# Patient Record
Sex: Female | Born: 1980 | Race: White | Hispanic: No | Marital: Married | Smoking: Never smoker
Health system: Southern US, Community
[De-identification: ages and names within clinical notes are randomized; demographics above are authoritative.]

## PROBLEM LIST (undated history)

## (undated) DIAGNOSIS — F17200 Nicotine dependence, unspecified, uncomplicated: Secondary | ICD-10-CM

## (undated) DIAGNOSIS — F341 Dysthymic disorder: Secondary | ICD-10-CM

## (undated) DIAGNOSIS — N912 Amenorrhea, unspecified: Secondary | ICD-10-CM

## (undated) DIAGNOSIS — F32A Depression, unspecified: Secondary | ICD-10-CM

## (undated) DIAGNOSIS — R197 Diarrhea, unspecified: Secondary | ICD-10-CM

## (undated) DIAGNOSIS — Z113 Encounter for screening for infections with a predominantly sexual mode of transmission: Secondary | ICD-10-CM

## (undated) DIAGNOSIS — L309 Dermatitis, unspecified: Secondary | ICD-10-CM

## (undated) DIAGNOSIS — Z302 Encounter for sterilization: Secondary | ICD-10-CM

## (undated) DIAGNOSIS — K219 Gastro-esophageal reflux disease without esophagitis: Secondary | ICD-10-CM

## (undated) DIAGNOSIS — L600 Ingrowing nail: Secondary | ICD-10-CM

## (undated) DIAGNOSIS — IMO0001 Reserved for inherently not codable concepts without codable children: Secondary | ICD-10-CM

## (undated) DIAGNOSIS — R002 Palpitations: Secondary | ICD-10-CM

## (undated) DIAGNOSIS — B182 Chronic viral hepatitis C: Principal | ICD-10-CM

## (undated) DIAGNOSIS — J019 Acute sinusitis, unspecified: Secondary | ICD-10-CM

## (undated) DIAGNOSIS — Z13220 Encounter for screening for lipoid disorders: Secondary | ICD-10-CM

## (undated) DIAGNOSIS — K529 Noninfective gastroenteritis and colitis, unspecified: Secondary | ICD-10-CM

## (undated) DIAGNOSIS — IMO0002 Reserved for concepts with insufficient information to code with codable children: Secondary | ICD-10-CM

## (undated) DIAGNOSIS — C50511 Malignant neoplasm of lower-outer quadrant of right female breast: Secondary | ICD-10-CM

## (undated) DIAGNOSIS — G43909 Migraine, unspecified, not intractable, without status migrainosus: Secondary | ICD-10-CM

## (undated) DIAGNOSIS — F329 Major depressive disorder, single episode, unspecified: Secondary | ICD-10-CM

## (undated) DIAGNOSIS — F419 Anxiety disorder, unspecified: Secondary | ICD-10-CM

## (undated) HISTORY — DX: Depression, unspecified: F32.A

## (undated) HISTORY — PX: LASIK: SHX215

## (undated) HISTORY — DX: Anxiety disorder, unspecified: F41.9

## (undated) HISTORY — DX: Major depressive disorder, single episode, unspecified: F32.9

## (undated) HISTORY — DX: Reserved for concepts with insufficient information to code with codable children: IMO0002

## (undated) HISTORY — DX: Malignant neoplasm of lower-outer quadrant of right female breast: C50.511

## (undated) HISTORY — PX: WISDOM TOOTH EXTRACTION: SHX21

## (undated) HISTORY — DX: Reserved for inherently not codable concepts without codable children: IMO0001

---

## 2007-11-28 ENCOUNTER — Other Ambulatory Visit: Admission: RE | Admit: 2007-11-28 | Discharge: 2007-11-28 | Payer: Self-pay | Admitting: Family Medicine

## 2008-12-01 ENCOUNTER — Other Ambulatory Visit: Admission: RE | Admit: 2008-12-01 | Discharge: 2008-12-01 | Payer: Self-pay | Admitting: Family Medicine

## 2009-12-31 ENCOUNTER — Other Ambulatory Visit: Admission: RE | Admit: 2009-12-31 | Discharge: 2009-12-31 | Payer: Self-pay | Admitting: Family Medicine

## 2010-10-07 ENCOUNTER — Encounter: Admission: RE | Admit: 2010-10-07 | Discharge: 2010-10-07 | Payer: Self-pay | Admitting: Family Medicine

## 2011-01-16 ENCOUNTER — Encounter: Payer: Self-pay | Admitting: Family Medicine

## 2014-04-07 ENCOUNTER — Other Ambulatory Visit: Payer: Self-pay | Admitting: Family Medicine

## 2014-04-07 DIAGNOSIS — E010 Iodine-deficiency related diffuse (endemic) goiter: Secondary | ICD-10-CM

## 2014-04-11 ENCOUNTER — Ambulatory Visit
Admission: RE | Admit: 2014-04-11 | Discharge: 2014-04-11 | Disposition: A | Discharge status: Home / Self Care | Payer: BC Managed Care – PPO | Source: Ambulatory Visit | Attending: Family Medicine | Admitting: Family Medicine

## 2014-04-11 DIAGNOSIS — E010 Iodine-deficiency related diffuse (endemic) goiter: Secondary | ICD-10-CM

## 2014-05-01 ENCOUNTER — Encounter: Payer: Self-pay | Admitting: Family Medicine

## 2014-05-01 ENCOUNTER — Encounter (INDEPENDENT_AMBULATORY_CARE_PROVIDER_SITE_OTHER): Payer: Self-pay

## 2014-05-01 ENCOUNTER — Ambulatory Visit (INDEPENDENT_AMBULATORY_CARE_PROVIDER_SITE_OTHER): Payer: BC Managed Care – PPO | Admitting: Family Medicine

## 2014-05-01 VITALS — BP 125/82 | HR 81 | Ht 65.0 in | Wt 160.0 lb

## 2014-05-01 DIAGNOSIS — M79609 Pain in unspecified limb: Secondary | ICD-10-CM

## 2014-05-01 DIAGNOSIS — M79604 Pain in right leg: Secondary | ICD-10-CM

## 2014-05-01 NOTE — Patient Instructions (Signed)
Your ultrasound is negative for a stress fracture. This is consistent with peroneal and anterior tibialis strains from trying to alter your gait. I would recommend resting for next 1-2 weeks from running. Ibuprofen 600mg  three times a day with food OR aleve 2 tabs twice a day with food for pain and inflammation as needed. Heat or ice as needed 15 minutes at a time. When pain is below a 3/10 you can start a walk: jog program as we discussed for total of 10 minutes. Increase every other day by 5 total minutes and the jog interval by a minute (so second run would be 2 minutes jogging for every 1 minutes walking for 15 minutes total; third - 3 minutes jogging to 1 minute walking for 20 minutes total, etc). If you change your gait you should do line drill for 50 paces after your runs and focus on heel to toe. You can also incorporate this in a similar way to a walk: jog program where the jog interval focuses on gait change. I could see you back in 2 weeks to reassess your gait if you would like. Consider metatarsal pads for the numbness you're experiencing - this should help with that and transverse arch breakdown. When tolerated (1-2 weeks) restart strengthening of your lower extremities with [place] and on your own.

## 2014-05-05 ENCOUNTER — Encounter: Payer: Self-pay | Admitting: Family Medicine

## 2014-05-05 DIAGNOSIS — M79604 Pain in right leg: Secondary | ICD-10-CM | POA: Insufficient documentation

## 2014-05-05 NOTE — Progress Notes (Signed)
Patient ID: Kara Meadows, female   DOB: 12/29/1980, 33 y.o.   MRN: [id number]  PCP: No primary provider on file.  Subjective:   HPI: Patient is a 33 y.o. female here for right leg pain.  Patient reports she has chronic issues with achilles, calf tightness, tendinopathy. Monday and Tuesday 5/4 and 5/5 had pain that was fairly severe in lateral and posterior calf waking her up at night at times. Able to bear weight. Believe she over ran on Saturday when she did a 10K - longer than usual but no pain that day. Did 5 mile walk on Sunday and felt a twinge in anterior shin area. Has h/o shin splints before but no stress fractures. Also gets numbness in feet/toes unrelated to current injury. Reports she has been actively trying to change from a forefoot striker to mid-hind foot and did so with the 10K run.  History reviewed. No pertinent past medical history.  No current outpatient prescriptions on file prior to visit.   No current facility-administered medications on file prior to visit.    Past Surgical History  Procedure Laterality Date  . Wisdom tooth extraction    . Lasik      Allergies  Allergen Reactions  . Amoxicillin     History   Social History  . Marital Status: Married    Spouse Name: N/A    Number of Children: N/A  . Years of Education: N/A   Occupational History  . Not on file.   Social History Main Topics  . Smoking status: Never Smoker   . Smokeless tobacco: Not on file  . Alcohol Use: Not on file  . Drug Use: Not on file  . Sexual Activity: Not on file   Other Topics Concern  . Not on file   Social History Narrative  . No narrative on file    Family History  Problem Relation Age of Onset  . Hyperlipidemia Mother   . Hypertension Mother   . Hypertension Father   . Hyperlipidemia Father   . Hypertension Brother     BP 125/82  Pulse 81  Ht 5\' 5"  (1.651 m)  Wt 160 lb (72.576 kg)  BMI 26.63 kg/m2  Review of Systems: See HPI above.     Objective:  Physical Exam:  Gen: NAD  Right leg: No gross deformity, swelling, bruising. FROM ankle with pain on ER and dorsiflexion.  Strength 5/5 all directions. TTP lateral to tibia and lateral lower leg.  No focal bony tenderness including tibia, fibula. Negative ant drawer and talar tilt.   Negative syndesmotic compression. Thompsons test negative. Negative fulcrum, hop. NV intact distally.  Bilateral feet with transverse arch collapse, callus formation 2nd-4th MT heads.  MSK u/s:  No evidence of cortical irregularity, edema, neovascularity to suggest stress fracture of right fibula or tibia.    Assessment & Plan:  1. Right leg pain - 2/2 peroneal and anterior tibialis strains from trying to alter her gait.  No evidence stress fracture based on exam and ultrasound.  Rest for next 1-2 weeks.  NSAIDs, discussed proper gait training.  Walk:jog program when she returns to running.  Home exercise program reviewed also.  Discussed metatarsal pads for toe numbness as well.

## 2014-05-05 NOTE — Assessment & Plan Note (Signed)
2/2 peroneal and anterior tibialis strains from trying to alter her gait.  No evidence stress fracture based on exam and ultrasound.  Rest for next 1-2 weeks.  NSAIDs, discussed proper gait training.  Walk:jog program when she returns to running.  Home exercise program reviewed also.  Discussed metatarsal pads for toe numbness as well.

## 2014-10-01 ENCOUNTER — Ambulatory Visit
Admit: 2014-10-01 | Discharge: 2014-10-01 | Payer: PRIVATE HEALTH INSURANCE | Attending: Infectious Disease | Primary: Internal Medicine

## 2014-10-01 DIAGNOSIS — B182 Chronic viral hepatitis C: Secondary | ICD-10-CM

## 2014-10-01 LAB — PLATELET COUNT: Platelets: 324 10*3/uL (ref 140–440)

## 2014-10-01 LAB — HEPATITIS B SURFACE ANTIBODY: Hep B S Ab: NOT DETECTED

## 2014-10-01 LAB — HIV PANEL, 1/2 ANTIBODY, P24 ANTIGEN: HIV 1+2 AB+HIV1P24 AG, EIA: NONREACTIVE

## 2014-10-01 LAB — HEPATITIS B SURFACE ANTIGEN: Hepatitis B Surface Ag: NOT DETECTED

## 2014-10-01 NOTE — Progress Notes (Signed)
Subjective:      Patient ID: Alyssa Freeman is a 33 y.o. female.    HPI Comments: This is a 33 year-old referred by PCP Dr. Dory Peru for chronic HCV management. I reviewed available records and data and the patient was a reliable historian. She was accompanied by her mother and my findings and recommendations will be communicated back to the referring physician by means of shared electronic medical records. This patient was recently diagnosed in the summer with HCV by GYN service when the patient wanted to have a "complete STD" check. Per patient she had a negative HCV test in around March but I did not have that documentation on that test. She stated that she has shared needles in the past and likely acquired HCV sometime earlier this year. She now has been sober for a little bit less than 3 months and has had good support from family. Available labs included: genotype 1a/1b, HCV RNA 616k IU/mL (5.79 logs), WBC 8.9k, HgB 14.7, PLTs 286k, creatinine 0.81, AST 136,  120. I reviewed HCV pathogenesis, rationale for treatment, medication, protocol and compliance and patient verbalized understanding. At this point since the patient reportedly was infected within the year I explained to her that we could monitor her viral load over time and see if she clears the virus spontaneously (~15% of infected individuals). In addition would need to see she remains sober and have some stability since relapse (drug) is a possibility. Will complete work-up at this point and recheck viral load in about 6 months and see where she is at. Will check HAV and HBV immune status, if no immune will vaccinate. Plan discussed in details with patient and verbalized understanding.      Review of Systems   Constitutional: Negative for fever, chills, diaphoresis, activity change, appetite change, fatigue and unexpected weight change.   HENT: Negative for congestion and trouble swallowing.    Eyes: Negative for photophobia.   Respiratory: Negative for  cough, chest tightness, shortness of breath and wheezing.    Cardiovascular: Negative for chest pain, palpitations and leg swelling.   Gastrointestinal: Negative for nausea, vomiting, abdominal pain, diarrhea and constipation.   Endocrine: Negative for cold intolerance and heat intolerance.   Genitourinary: Negative for dysuria, frequency, hematuria and flank pain.   Musculoskeletal: Negative for myalgias, back pain, joint swelling and arthralgias.   Skin: Negative for color change and rash.   Neurological: Positive for headaches. Negative for dizziness, syncope, weakness and numbness.   Psychiatric/Behavioral: Negative for suicidal ideas, sleep disturbance and agitation. The patient is not nervous/anxious.        Objective:   Physical Exam   Constitutional: She is oriented to person, place, and time. She appears well-developed and well-nourished. No distress.   HENT:   Head: Normocephalic and atraumatic.   Nose: Nose normal.   Mouth/Throat: Oropharynx is clear and moist. No oropharyngeal exudate.   Eyes: Conjunctivae and EOM are normal. Pupils are equal, round, and reactive to light. Right eye exhibits no discharge. Left eye exhibits no discharge. No scleral icterus.   Neck: Normal range of motion. Neck supple. No thyromegaly present.   Cardiovascular: Normal rate, regular rhythm and normal heart sounds.    No murmur heard.  Pulmonary/Chest: Breath sounds normal. No respiratory distress. She has no wheezes. She has no rales.   Abdominal: Soft. Bowel sounds are normal. She exhibits no distension and no mass. There is no hepatosplenomegaly. There is no tenderness. There is no rebound.   Musculoskeletal: She  exhibits no edema or tenderness.   Lymphadenopathy:     She has no cervical adenopathy.   Neurological: She is alert and oriented to person, place, and time. No cranial nerve deficit.   Skin: Skin is warm and dry. No rash noted. She is not diaphoretic. No erythema.   Psychiatric: She has a normal mood and affect.  Thought content normal.       Assessment:      1. Chronic hepatitis C without hepatic coma (HCC)  HCV FIBROSURE    Hepatitis B surface antibody    Hepatitis A antibody, total    HIV-1/HIV-2 Antibody Diff. (Supp.)    CBC with Differential    COMP METABOLIC PROFILE    Hepatitis B Surface Antigen    Hepatitis B Core Antibody, Total    HEP C RNA, QUANT (VIRAL LOAD)    US LIVER ECHO   2. History of heroin abuse             Plan:      1. 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 Discussed about no alcohol usage. Will do liver staging suspect to be early stage (F0 or F1). See back in about 6 months with labs.    Spent >51% of time discussing with patient and counseling. Time spent 45 minutes.

## 2014-10-01 NOTE — Patient Instructions (Signed)
Pt is scheduled for her US @ [address] on 10/06/2014 @ 7:30 am, instructed pt to have nothing to eat or drink or any take any medications after midnight the night before. Pt gave verbal understanding. Also faxed over order to central scheduling.

## 2014-10-03 LAB — HEPATITIS A ANTIBODY, TOTAL: Hep A Total Ab: NEGATIVE

## 2014-10-03 LAB — HEPATITIS B CORE ANTIBODY, TOTAL: Hep B Core Total Ab: NEGATIVE

## 2014-10-07 LAB — MISCELLANEOUS REFERENCE TEST

## 2014-10-22 NOTE — Telephone Encounter (Signed)
Pt called to see how the results of her US Liver came back. Please advise. Alyssa Freeman

## 2014-10-23 NOTE — Telephone Encounter (Signed)
US liver normal. F1 predominant disease possibly F2 likely early disease. See back in 6 months as she needs to demonstrate sobriety and compliance.

## 2014-10-23 NOTE — Telephone Encounter (Signed)
Call back to pt, no answer. Left VM to inform of fibrosis score and US. Requested call back if would like to discuss these as well as other testing done. Has f/u appt scheduled for March 2016

## 2014-12-12 ENCOUNTER — Other Ambulatory Visit: Payer: Self-pay | Admitting: Family Medicine

## 2014-12-12 DIAGNOSIS — R1084 Generalized abdominal pain: Secondary | ICD-10-CM

## 2014-12-29 ENCOUNTER — Ambulatory Visit
Admission: RE | Admit: 2014-12-29 | Discharge: 2014-12-29 | Disposition: A | Discharge status: Home / Self Care | Payer: BC Managed Care – PPO | Source: Ambulatory Visit | Attending: Family Medicine | Admitting: Family Medicine

## 2014-12-29 DIAGNOSIS — R1084 Generalized abdominal pain: Secondary | ICD-10-CM

## 2015-02-24 ENCOUNTER — Ambulatory Visit
Admit: 2015-02-24 | Discharge: 2015-02-24 | Payer: PRIVATE HEALTH INSURANCE | Attending: Internal Medicine | Primary: Internal Medicine

## 2015-02-24 DIAGNOSIS — B349 Viral infection, unspecified: Secondary | ICD-10-CM

## 2015-02-24 MED ORDER — TRAZODONE HCL 100 MG PO TABS
100 MG | ORAL_TABLET | Freq: Every evening | ORAL | Status: DC
Start: 2015-02-24 — End: 2016-01-25

## 2015-02-24 MED ORDER — SERTRALINE HCL 100 MG PO TABS
100 MG | ORAL_TABLET | Freq: Once | ORAL | Status: DC
Start: 2015-02-24 — End: 2015-03-03

## 2015-02-24 NOTE — Progress Notes (Signed)
Subjective:  Alyssa Freeman is a 34 y.o. female who presents with chief complaint of Follow-up      HPI:  Viral syndrome: Patient states she has viral syndrome  Nausea and URi symptoms     HTN: Patient's BP is 140/72   She is not on any medications  I will recheck next time if worse needs treatment     Depression:  Patient states she needs refill trazadone and sertraline she is stable and no problems     Hep C: She has hep C and seeing Dr. Claudius SisLeung She needs vaccine for B and A     Current Meds: reviewed below  Current Outpatient Prescriptions   Medication Sig Dispense Refill   . traZODone (DESYREL) 100 MG tablet Take 0.5 tablets by mouth nightly 30 tablet 2   . sertraline (ZOLOFT) 100 MG tablet Take 1 tablet by mouth once for 1 dose 1 tablet 0   . desoximetasone (TOPICORT) 0.05 % cream      . omeprazole (PRILOSEC) 20 MG capsule Take 20 mg by mouth once     . etonogestrel (NEXPLANON) 68 MG implant Inject 68 mg into the skin once Indications: Left Arm Implant       No current facility-administered medications for this visit.         Past Medical History   Diagnosis Date   . GERD (gastroesophageal reflux disease)    . Rash    . Depression    . Sleep disorder    . Hypertension      family history includes Diabetes in her mother; Heart Disease in her father.   reports that she has been smoking Cigarettes.  She has a 10 pack-year smoking history. She does not have any smokeless tobacco history on file.    Review of Systems   Constitutional: Negative for fever and chills.   Respiratory: Negative for cough and shortness of breath.    Cardiovascular: Negative for chest pain and palpitations.   Gastrointestinal: Negative for nausea, vomiting and diarrhea.       Objective:    Filed Vitals:    02/24/15 0909   BP: 140/72   Height: 5\' 2"  (1.575 m)   Weight: 186 lb (84.369 kg)     Physical Exam   Constitutional: She appears well-developed and well-nourished.   HENT:   Head: Normocephalic and atraumatic.   Mouth/Throat: No  oropharyngeal exudate.   Neck: Normal range of motion.   Cardiovascular: Normal rate, regular rhythm and normal heart sounds.  Exam reveals no gallop and no friction rub.    No murmur heard.  Pulmonary/Chest: Effort normal. No respiratory distress. She has no wheezes. She has no rales.   Abdominal: Soft.       ASSESSMENT/PLAN:    1. Viral syndrome  No treatment encourage fluids   2. Essential hypertension  Recheck next visit   3. Depression  - traZODone (DESYREL) 100 MG tablet; Take 0.5 tablets by mouth nightly  Dispense: 30 tablet; Refill: 2  - sertraline (ZOLOFT) 100 MG tablet; Take 1 tablet by mouth once for 1 dose  Dispense: 1 tablet; Refill: 0    4. Hepatitis C virus infection, unspecified chronicity    5. Need for 23-polyvalent pneumococcal polysaccharide vaccine  - Pneumococcal polysaccharide vaccine 23-valent greater than or equal to 2yo subcutaneous/IM        BMI was elevated today, and weight loss plan recommended is : conventional weight loss and daily excercise regimen.    FOLLOWUP  Return in about 4 months (around 06/26/2015).    Electronically signed by Tommy Medal, MD on 02/24/15 at 9:26 AM

## 2015-03-03 ENCOUNTER — Telehealth

## 2015-03-03 MED ORDER — SERTRALINE HCL 100 MG PO TABS
100 MG | ORAL_TABLET | Freq: Every day | ORAL | Status: DC
Start: 2015-03-03 — End: 2015-05-27

## 2015-03-03 NOTE — Telephone Encounter (Signed)
Pt aware to check with pharm.

## 2015-03-03 NOTE — Telephone Encounter (Signed)
Ok sent new rx in

## 2015-03-03 NOTE — Telephone Encounter (Signed)
Pt picked up Zoloft refill from pharm, but directions are 100mg  QD and she has always taken 1 and 1/2 QD.

## 2015-03-16 ENCOUNTER — Inpatient Hospital Stay
Admit: 2015-03-16 | Discharge: 2015-03-16 | Disposition: A | Discharge status: Home / Self Care | Attending: Emergency Medicine

## 2015-03-16 NOTE — ED Provider Notes (Signed)
Alyssa Freeman:          Prisk, Kalasia          DOS:           03/16/2015  MR #:             [id number]             ACCOUNT #:     [id number]  DATE OF BIRTH:    1981-08-17              AGE:           34      HISTORY OF PRESENT ILLNESS:    PERTINENT HISTORY OF PRESENT  ILLNESS. I saw this patient with Dr.  Elwin SleightBarone.  Patient presents with bilateral hand pain x3 weeks which was gradual  in onset  and continues.  Tightness of bilateral hands, 5/10.  Nothing makes it  better  nothing makes it worse.  She states she thinks she had an allergic  reaction to  a tanning lotion approximately 3 weeks ago.  Her hands are now itching  and she  has been scratching a lot. She put the tanning lotion all over her  body, but  her hands are the only affected areas. She cannot recall any other  specific  things that might be coming into contact with her hands that is new.  She has  tried several lotions, "crack cream ", OTC hydrocortisone, Neosporin  and  Benadryl.  She reports no improvement from these.  History of similar  symptoms  in the past however these topical medicines usually help.  She denies  any other  new soaps, lotions, detergents or medications.  Review of systems is  negative  except for rash.    PERTINENT PAST/ FAMILY/SOCIAL HISTORY PMH: Depression,  gastroesophageal  reflux disease  PSH: Cholecystectomy, right femur surgery  No known drug allergies  Smokes half a pack a day        PHYSICAL EXAM Vital signs normal.  This is a well-nourished  well-developed  adult female sitting up in the bed in no acute distress.  She is alert  and  oriented with normal affect.  Cranial nerves intact.  Heart has a  regular rate  and rhythm with no murmur.  No respiratory distress, lungs good  auscultation  bilaterally.  Chest is nontender.  Rash on bilateral hands, worse in  the palmar  aspect it does extend around to the dorsal aspect.  Dry and scaling  with a few  scabs in areas where she scratched.  There is no drainage from  these  areas.  No  signs of abscess or cellulitis.  Hands are nontender.  No  lymphangitis. She has  full range of motion.  Sensation intact to light touch distally.  Radial pulses  are 2+ bilateral and symmetric.    MEDICAL DECISION MAKING:    SIGNIFICANT FINDINGS/ED COURSE/MEDICAL DECISION MAKING/TREATMENT  PLAN She  denies any fever.  She denies any symptoms of angioedema.  She has had  similar  symptoms in the past; lotions usually help.  She was seen by her PCP  who told  her it is likely eczema.  Physical exam shows very dry and scaling  skin, with  some excoriations which she reports as resulting from her scratching.  I  explained to her that this does seem like eczema, and should respond  to  steroids, but may be persistent if she is continuing to be exposed  to  an  inciting agent.  She will be given a prescription for triamcinolone  topical  0.5%.  She is encouraged to followup -- she actually has an  appointment with a  dermatologist in 3-4 days. She was encouraged to take pictures of her  hands, in  case the cream helps before then.  She is agreeable to plan.  She is  discharged  home in stable condition.    PROBLEM LIST:         ED Diagnosis:     Atopic dermatitis, unspecified type (L20.9): Entered Date:  16-Mar-2015  17:28, Entered By: Levonne Hubert A, Status: Active, ICD-10: L20.9       Admit Reason:     B hand pain: Entered Date: 16-Mar-2015 12:59, Entered By:  Standley Brooking, Status: Active    (Removed):        ADDITIONAL INFORMATION If the physician assistant/nurse practitioner  was  involved in patient care, I personally performed and participated in  all the  above services (including HPI and PE). I have reviewed with the  physician  assistant/nurse practitioner the history and confirmed the findings  with the  patient. I personally performed all surgical procedures in the medical  record  unless otherwise indicated.      COPIES SENT TO::     CHUNG, MATTHEW(PCP): [id number]    Electronic  Signatures:  Tawni Pummel (MD)  (Signed 16-Mar-2015 17:31)   Entered: HISTORY OF PRESENT ILLNESS, PHYSICAL EXAM, MEDICAL DECISION  MAKING,  PROBLEM LIST, DIAGNOSIS   Authored: HISTORY OF PRESENT ILLNESS, PHYSICAL EXAM, MEDICAL DECISION  MAKING,  PROBLEM LIST, DIAGNOSIS, Additional Infomation, Copies to be sent to:  Waynette Buttery R (PA)  (Entered 16-Mar-2015 15:30)   Entered: HISTORY OF PRESENT ILLNESS, PHYSICAL EXAM, MEDICAL DECISION  MAKING,  PROBLEM LIST, DIAGNOSIS, Additional Infomation, Copies to be sent to:      Last Updated: 16-Mar-2015 17:31 by Tawni Pummel (MD)            Please see T-Sheet, initial assessment, and physician orders for  further details.    Dictating Physician: Tawni Pummel, MD  Original Electronic Signature Date: 03/16/2015 03:30 P  BAB  Document #: [id number]    cc:  Anastasio Auerbach. Dory Peru, MD       [address]       [place] [address]

## 2015-03-17 ENCOUNTER — Ambulatory Visit
Admit: 2015-03-17 | Discharge: 2015-03-17 | Payer: PRIVATE HEALTH INSURANCE | Attending: Infectious Disease | Primary: Internal Medicine

## 2015-03-17 DIAGNOSIS — B182 Chronic viral hepatitis C: Secondary | ICD-10-CM

## 2015-03-17 LAB — CBC WITH AUTO DIFFERENTIAL
Hematocrit: 41.7 % (ref 35.0–47.0)
Hemoglobin: 13.8 g/dl (ref 11.7–16.0)
MCH: 28.9 pg (ref 26.0–34.0)
MCHC: 33 % (ref 32.0–36.0)
MCV: 87.4 fl (ref 79.0–98.0)
MPV: 8.7 fl (ref 7.4–10.4)
Platelets: 246 10*3/uL (ref 140–440)
RBC: 4.77 10*6/uL (ref 3.80–5.20)
RDW: 13.5 % (ref 11.5–14.5)
WBC: 16 10*3/uL — ABNORMAL HIGH (ref 3.6–10.7)

## 2015-03-17 LAB — PREGNANCY, URINE: HCG Urine: NEGATIVE

## 2015-03-17 LAB — MANUAL DIFFERENTIAL
Absolute Baso #: 0 10*3/uL (ref 0.0–0.2)
Absolute Eos #: 0 10*3/uL (ref 0.0–0.5)
Absolute Lymph #: 1.1 10*3/uL (ref 1.1–4.5)
Absolute Mono #: 0.5 10*3/uL (ref 0.2–1.1)
Absolute Neut #: 14.4 10*3/uL — ABNORMAL HIGH (ref 2.2–8.2)
Bands: 2 %
Basophils: 0 %
Eosinophils: 0 %
Lymphocytes: 7 %
Monocytes: 3 %
RBC Morphology: NORMAL
Seg Neutrophils: 88 %
TOTAL CELLS COUNTED: 100

## 2015-03-17 LAB — ETHANOL: Ethanol Lvl: <0.003 g/dL (ref 0.000–0.003)

## 2015-03-17 LAB — COMPREHENSIVE METABOLIC PANEL
ALT: 63 U/L (ref 12–78)
AST: 71 U/L — ABNORMAL HIGH (ref 15–37)
Albumin,Serum: 3.7 g/dL (ref 3.1–4.6)
Alkaline Phosphatase: 106 U/L (ref 45–117)
Anion Gap: 12
BUN: 11 mg/dL (ref 7–25)
CO2: 20 mmol/L — ABNORMAL LOW (ref 21–32)
Calcium: 8.5 mg/dL (ref 8.2–10.1)
Chloride: 105 mmol/L (ref 98–109)
Creatinine: 0.71 mg/dL (ref 0.55–1.40)
EGFR IF NonAfrican American: >60 mL/min (ref >60)
Glucose: 93 mg/dL (ref 70–100)
Potassium: 4.2 mmol/L (ref 3.5–5.1)
Sodium: 137 mmol/L (ref 135–145)
Total Bilirubin: 0.4 mg/dL (ref 0.2–1.0)
Total Protein: 7.3 g/dL (ref 6.4–8.2)
eGFR African American: >60 mL/min (ref >60)

## 2015-03-17 LAB — UNCONFIRMED DRUG SCREEN
Amphetamines: NEGATIVE
Barbiturates: NEGATIVE
Benzodiazepines: NEGATIVE
Cocaine: NEGATIVE
Methadone: NEGATIVE
Opiates: NEGATIVE
Oxycodone + Oxymorphone: NEGATIVE
PCP: NEGATIVE

## 2015-03-17 LAB — ALCOHOL-URINE SCREEN: Ethanol, Ur: NOT DETECTED

## 2015-03-17 LAB — CANNABINOID, URINE, CONFIRMATION

## 2015-03-17 NOTE — Progress Notes (Signed)
Subjective:      Patient ID: Alyssa Freeman is a 34 y.o. female.    HPI Comments: [name] returned for follow-up. She is HCV genotype 1a/1b baseline viral load about 5.8 logs treatment naive and F1 disease by [facility] testing. She has been sober for about 8+ months and interested in treatment. I reviewed with her the liver staging and her current liver status. She also has experienced bilateral hand blisters and rash for about 3 weeks. About 3 weeks ago she put on tanning lotion and progressively getting worse on her hands with rash and blister-like lesions. They were somewhat painful and pruritic and went to the ED yesterday and got Kenalog cream which since has improved. She will see dermatology this week. Will update labs (see orders) and see back once medication is approved. Many options available and will try Zepatier.      Review of Systems   Constitutional: Positive for fatigue (always tired). Negative for fever, chills, diaphoresis, activity change, appetite change and unexpected weight change.   HENT: Positive for sore throat and trouble swallowing. Negative for congestion and facial swelling.    Eyes: Negative.  Negative for photophobia and discharge.   Respiratory: Positive for cough (clear) and shortness of breath (smoking). Negative for chest tightness and wheezing.    Cardiovascular: Positive for palpitations (once in a great while). Negative for chest pain and leg swelling.   Gastrointestinal: Negative.  Negative for nausea, vomiting, abdominal pain, diarrhea and constipation.   Endocrine: Negative.  Negative for cold intolerance and heat intolerance.   Genitourinary: Negative.  Negative for dysuria, frequency, hematuria and flank pain.   Musculoskeletal: Negative.  Negative for myalgias, back pain, joint swelling and arthralgias.   Skin: Positive for rash (hands). Negative for color change.   Allergic/Immunologic: Negative.    Neurological: Negative.  Negative for dizziness, syncope, weakness,  numbness and headaches.   Hematological: Negative.    Psychiatric/Behavioral: Negative for sleep disturbance and agitation. The patient is nervous/anxious (better since clean).        Objective:   Physical Exam   Constitutional: She is oriented to person, place, and time. She appears well-developed and well-nourished. No distress.   HENT:   Head: Normocephalic and atraumatic.   Nose: Nose normal.   Mouth/Throat: Oropharynx is clear and moist. No oropharyngeal exudate.   Eyes: Conjunctivae and EOM are normal. Pupils are equal, round, and reactive to light. Right eye exhibits no discharge. Left eye exhibits no discharge. No scleral icterus.   Neck: Normal range of motion. Neck supple. No thyromegaly present.   Cardiovascular: Normal rate, regular rhythm and normal heart sounds.    No murmur heard.  Pulmonary/Chest: Breath sounds normal. No respiratory distress. She has no wheezes. She has no rales.   Abdominal: Soft. Bowel sounds are normal. She exhibits no distension and no mass. There is no tenderness. There is no rebound.   Musculoskeletal: She exhibits no edema or tenderness.   Lymphadenopathy:     She has no cervical adenopathy.   Neurological: She is alert and oriented to person, place, and time. No cranial nerve deficit.   Skin: Skin is dry. Rash noted. She is not diaphoretic. No erythema.   Rash and pustule-like lesions, improving from before   Psychiatric: She has a normal mood and affect. Thought content normal.       Assessment:      1. Chronic hepatitis C without hepatic coma (HCC)  Hepatitis A hepatitis B combined vaccine IM  Drugs of Abuse, Urine    MISCELLANEOUS TESTING    Pregnancy, Urine    HEP C RNA, QUANT (VIRAL LOAD)    CBC with Differential    COMP METABOLIC PROFILE    Ethanol, Whole Blood   2. Need for prophylactic vaccination and inoculation against viral hepatitis  Hepatitis A hepatitis B combined vaccine IM           Plan:      HCV genotype 1a/1b baseline viral load about 5.8 logs treatment  naive  1. Update labs, including testing for pregnancy and urine toxicology screen.  2. Check NS5A resistance.  3. Discussed with patient to maintain sobriety  4. Twinrix today  See back after medication is approved  Discussed in details with patient and verbalized understanding.    Dietrich Pates DO  .

## 2015-03-23 ENCOUNTER — Other Ambulatory Visit: Payer: Self-pay | Admitting: Family Medicine

## 2015-03-23 ENCOUNTER — Other Ambulatory Visit: Payer: Self-pay | Admitting: Pediatrics

## 2015-03-23 DIAGNOSIS — E041 Nontoxic single thyroid nodule: Secondary | ICD-10-CM

## 2015-03-24 LAB — HEP C RNA, QUANT (VIRAL LOAD)
Hepatitis C RNA Quant Log: 4.72 {Log_IU} — AB (ref <1.18)
Hepatitis C RNA Quantitative: 53015 IU/mL — AB (ref <15)

## 2015-03-27 ENCOUNTER — Other Ambulatory Visit: Payer: Self-pay | Admitting: Family Medicine

## 2015-03-27 ENCOUNTER — Other Ambulatory Visit (HOSPITAL_COMMUNITY)
Admission: RE | Admit: 2015-03-27 | Discharge: 2015-03-27 | Disposition: A | Discharge status: Home / Self Care | Payer: BC Managed Care – PPO | Source: Ambulatory Visit | Attending: Family Medicine | Admitting: Family Medicine

## 2015-03-27 DIAGNOSIS — N63 Unspecified lump in unspecified breast: Secondary | ICD-10-CM

## 2015-03-27 DIAGNOSIS — Z124 Encounter for screening for malignant neoplasm of cervix: Secondary | ICD-10-CM | POA: Insufficient documentation

## 2015-03-27 DIAGNOSIS — Z1151 Encounter for screening for human papillomavirus (HPV): Secondary | ICD-10-CM | POA: Insufficient documentation

## 2015-04-06 ENCOUNTER — Other Ambulatory Visit: Payer: Self-pay | Admitting: Family Medicine

## 2015-04-06 ENCOUNTER — Ambulatory Visit
Admission: RE | Admit: 2015-04-06 | Discharge: 2015-04-06 | Disposition: A | Discharge status: Home / Self Care | Payer: BC Managed Care – PPO | Source: Ambulatory Visit | Attending: Family Medicine | Admitting: Family Medicine

## 2015-04-06 DIAGNOSIS — N63 Unspecified lump in unspecified breast: Secondary | ICD-10-CM

## 2015-04-07 LAB — MISCELLANEOUS REFERENCE TEST

## 2015-04-08 ENCOUNTER — Other Ambulatory Visit: Payer: Self-pay | Admitting: Family Medicine

## 2015-04-08 ENCOUNTER — Ambulatory Visit
Admission: RE | Admit: 2015-04-08 | Discharge: 2015-04-08 | Disposition: A | Discharge status: Home / Self Care | Payer: BC Managed Care – PPO | Source: Ambulatory Visit | Attending: Family Medicine | Admitting: Family Medicine

## 2015-04-08 DIAGNOSIS — N63 Unspecified lump in unspecified breast: Secondary | ICD-10-CM

## 2015-04-08 DIAGNOSIS — C50911 Malignant neoplasm of unspecified site of right female breast: Secondary | ICD-10-CM

## 2015-04-10 ENCOUNTER — Encounter: Payer: Self-pay | Admitting: *Deleted

## 2015-04-10 ENCOUNTER — Ambulatory Visit
Admission: RE | Admit: 2015-04-10 | Discharge: 2015-04-10 | Disposition: A | Discharge status: Home / Self Care | Payer: BC Managed Care – PPO | Source: Ambulatory Visit | Attending: Family Medicine | Admitting: Family Medicine

## 2015-04-10 ENCOUNTER — Telehealth: Payer: Self-pay | Admitting: *Deleted

## 2015-04-10 DIAGNOSIS — C50511 Malignant neoplasm of lower-outer quadrant of right female breast: Secondary | ICD-10-CM

## 2015-04-10 DIAGNOSIS — C50311 Malignant neoplasm of lower-inner quadrant of right female breast: Secondary | ICD-10-CM | POA: Insufficient documentation

## 2015-04-10 DIAGNOSIS — C50911 Malignant neoplasm of unspecified site of right female breast: Secondary | ICD-10-CM

## 2015-04-10 HISTORY — DX: Malignant neoplasm of lower-outer quadrant of right female breast: C50.511

## 2015-04-10 MED ORDER — GADOBENATE DIMEGLUMINE 529 MG/ML IV SOLN: 15.0000 mL | Freq: Once | INTRAVENOUS | Status: AC | PRN

## 2015-04-10 NOTE — Telephone Encounter (Signed)
Confirmed [facility] for 04/15/15 at 1200 .  Instructions and contact information given.

## 2015-04-15 ENCOUNTER — Other Ambulatory Visit: Payer: Self-pay | Admitting: Surgery

## 2015-04-15 ENCOUNTER — Ambulatory Visit (HOSPITAL_BASED_OUTPATIENT_CLINIC_OR_DEPARTMENT_OTHER): Payer: BC Managed Care – PPO | Admitting: Hematology and Oncology

## 2015-04-15 ENCOUNTER — Ambulatory Visit: Payer: Self-pay | Admitting: Surgery

## 2015-04-15 ENCOUNTER — Encounter: Payer: Self-pay | Admitting: Physical Therapy

## 2015-04-15 ENCOUNTER — Other Ambulatory Visit (HOSPITAL_BASED_OUTPATIENT_CLINIC_OR_DEPARTMENT_OTHER): Payer: BC Managed Care – PPO

## 2015-04-15 ENCOUNTER — Ambulatory Visit: Payer: BC Managed Care – PPO | Attending: Surgery | Admitting: Physical Therapy

## 2015-04-15 ENCOUNTER — Ambulatory Visit
Admission: RE | Admit: 2015-04-15 | Discharge: 2015-04-15 | Disposition: A | Discharge status: Home / Self Care | Payer: BC Managed Care – PPO | Source: Ambulatory Visit | Attending: Radiation Oncology | Admitting: Radiation Oncology

## 2015-04-15 ENCOUNTER — Encounter: Payer: Self-pay | Admitting: Hematology and Oncology

## 2015-04-15 ENCOUNTER — Ambulatory Visit (HOSPITAL_BASED_OUTPATIENT_CLINIC_OR_DEPARTMENT_OTHER): Payer: BC Managed Care – PPO

## 2015-04-15 ENCOUNTER — Telehealth

## 2015-04-15 VITALS — BP 120/62 | HR 79 | Temp 98.0°F | Resp 18 | Ht 65.0 in | Wt 172.5 lb

## 2015-04-15 DIAGNOSIS — C50311 Malignant neoplasm of lower-inner quadrant of right female breast: Secondary | ICD-10-CM

## 2015-04-15 DIAGNOSIS — C50911 Malignant neoplasm of unspecified site of right female breast: Secondary | ICD-10-CM

## 2015-04-15 DIAGNOSIS — Z170 Estrogen receptor positive status [ER+]: Secondary | ICD-10-CM | POA: Diagnosis not present

## 2015-04-15 DIAGNOSIS — C50511 Malignant neoplasm of lower-outer quadrant of right female breast: Secondary | ICD-10-CM

## 2015-04-15 LAB — COMPREHENSIVE METABOLIC PANEL (CC13)
ALT: 16 U/L (ref 0–55)
AST: 18 U/L (ref 5–34)
Albumin: 3.8 g/dL (ref 3.5–5.0)
Alkaline Phosphatase: 58 U/L (ref 40–150)
Anion Gap: 11 mEq/L (ref 3–11)
BUN: 12.9 mg/dL (ref 7.0–26.0)
CALCIUM: 8.9 mg/dL (ref 8.4–10.4)
CO2: 24 mEq/L (ref 22–29)
Chloride: 106 mEq/L (ref 98–109)
Creatinine: 0.8 mg/dL (ref 0.6–1.1)
EGFR: >90 mL/min/{1.73_m2} (ref >90)
Glucose: 86 mg/dl (ref 70–140)
POTASSIUM: 4.2 meq/L (ref 3.5–5.1)
SODIUM: 141 meq/L (ref 136–145)
TOTAL PROTEIN: 6.9 g/dL (ref 6.4–8.3)
Total Bilirubin: <0.2 mg/dL (ref 0.20–1.20)

## 2015-04-15 LAB — CBC WITH DIFFERENTIAL/PLATELET
BASO%: 0.4 % (ref 0.0–2.0)
Basophils Absolute: 0 10*3/uL (ref 0.0–0.1)
EOS%: 3.1 % (ref 0.0–7.0)
Eosinophils Absolute: 0.2 10*3/uL (ref 0.0–0.5)
HCT: 39.8 % (ref 34.8–46.6)
HEMOGLOBIN: 12.8 g/dL (ref 11.6–15.9)
LYMPH%: 38.8 % (ref 14.0–49.7)
MCH: 28.5 pg (ref 25.1–34.0)
MCHC: 32.1 g/dL (ref 31.5–36.0)
MCV: 88.9 fL (ref 79.5–101.0)
MONO#: 0.5 10*3/uL (ref 0.1–0.9)
MONO%: 8.7 % (ref 0.0–14.0)
NEUT#: 2.6 10*3/uL (ref 1.5–6.5)
NEUT%: 49 % (ref 38.4–76.8)
Platelets: 185 10*3/uL (ref 145–400)
RBC: 4.48 10*6/uL (ref 3.70–5.45)
RDW: 13.2 % (ref 11.2–14.5)
WBC: 5.4 10*3/uL (ref 3.9–10.3)
lymph#: 2.1 10*3/uL (ref 0.9–3.3)

## 2015-04-15 NOTE — Telephone Encounter (Signed)
Received fax from [facility], Zepatier has been approved for 12 weeks, thru 07/08/2015, delivery is expected Friday, 04/17/2015 at office. Will contact pt when medications arrive

## 2015-04-15 NOTE — Progress Notes (Signed)
Checked in new pt with no financial concerns prior to seeing the dr. Informed pt if chemo is part of her treatment we will call her ins to see if Josem Kaufmann is req and will obtain it if it is as well as contact foundations that offer copay assistance for chemo if needed. She has my card for any billing questions or concerns.

## 2015-04-15 NOTE — Progress Notes (Signed)
Kara Meadows is a very pleasant 34 y.o. 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  Biopsy results revealed the tumor's prognostic profile is ER positive, PR positive, and HER2/neu negative. Ki67 is 30%.  She presents today with her husband and mother to the [facility]) for treatment consideration and recommendations from the breast surgeon, radiation oncologist, and medical oncologist.     I briefly met with Kara Meadows and her family during her [facility] visit today. We discussed the purpose of the Survivorship Clinic, which will include monitoring for recurrence, coordinating completion of age and gender-appropriate cancer screenings, promotion of overall wellness, as well as managing potential late/long-term side effects of anti-cancer treatments.    The treatment plan for Kara Meadows will likely include surgery,  radiation therapy, and anti-estrogen therapy.  She will meet with the Genetics Counselor due to her age at diagnosis of breast cancer. As of today, the intent of treatment for Kara Meadows is cure, therefore she will be eligible for the Survivorship Clinic upon her completion of treatment.  Her survivorship care plan (SCP) document will be drafted and updated throughout the course of her treatment trajectory. She will receive the SCP in an office visit with myself in the Survivorship Clinic once she has completed treatment.   Kara Meadows was encouraged to ask questions and all questions were answered to her satisfaction.  She was given my business card and encouraged to contact me with any concerns regarding survivorship.  I look forward to participating in her care.   Mike Craze, NP [place] [phone]

## 2015-04-15 NOTE — Patient Instructions (Signed)

## 2015-04-15 NOTE — Progress Notes (Signed)
[place] NOTE  Patient Care Team: Carlos Levering, PA-C as PCP - General (Family Medicine) Erroll Luna, MD as Consulting Physician (General Surgery) Nicholas Lose, MD as Consulting Physician (Hematology and Oncology) Gery Pray, MD as Consulting Physician (Radiation Oncology) Rockwell Germany, RN as Registered Nurse Mauro Kaufmann, RN as Registered Nurse Holley Bouche, NP as Nurse Practitioner (Nurse Practitioner)  CHIEF COMPLAINTS/PURPOSE OF CONSULTATION:  Newly diagnosed breast cancer  HISTORY OF PRESENTING ILLNESS:  Kara Meadows 34 y.o. female is here because of recent diagnosis of left breast cancer. Patient found a lump in the right breast at 4:00 position and then underwent a mammogram and ultrasound which revealed a 1.5 Sinemet or mass. This was biopsy-proven to be grade 2 invasive ductal carcinoma with DCIS. This was ER/PR positive HER-2 negative with a Ki-67 of 30%. MRI of the breasts was done which revealed a 1.7 cm tumor. She was present this morning at the multidisciplinary tumor board and she is here accompanied by her husband to discuss the treatment plan.  I reviewed her records extensively and collaborated the history with the patient.  SUMMARY OF ONCOLOGIC HISTORY:   Breast cancer of lower-inner quadrant of right female breast   04/06/2015 Initial Diagnosis Right breast biopsy 4:00: Invasive ductal carcinoma with DCIS grade 2, ER 100%, PR 100%, Ki-67 30%, HER-2 negative   04/10/2015 Breast MRI Right breast mass 1.7 x 1.7 x 0.9 cm, lymph nodes negative    In terms of breast cancer risk profile:  She menarched at early age of 59  She had no pregnancies  She has received birth control pills for approximately 6 years.  She was never exposed to fertility medications or hormone replacement therapy.  She has  family history of Breast/GYN/GI cancer Paternal aunt age 32 breast cancer, paternal grandmother 53 with breast cancer, father age 35  melanoma, maternal uncle age 25 for prostate cancer  MEDICAL HISTORY:  Past Medical History  Diagnosis Date  . Breast cancer of lower-outer quadrant of right female breast 04/10/2015  . Anxiety   . Depression     SURGICAL HISTORY: Past Surgical History  Procedure Laterality Date  . Wisdom tooth extraction    . Lasik      SOCIAL HISTORY: History   Social History  . Marital Status: Married    Spouse Name: N/A  . Number of Children: N/A  . Years of Education: N/A   Occupational History  . Not on file.   Social History Main Topics  . Smoking status: Never Smoker   . Smokeless tobacco: Not on file  . Alcohol Use: Yes     Comment: 2  . Drug Use: No  . Sexual Activity: Not on file   Other Topics Concern  . Not on file   Social History Narrative    FAMILY HISTORY: Family History  Problem Relation Age of Onset  . Hyperlipidemia Mother   . Hypertension Mother   . Hypertension Father   . Hyperlipidemia Father   . Melanoma Father   . Hypertension Brother   . Prostate cancer Maternal Uncle   . Breast cancer Paternal Aunt   . Breast cancer Paternal Grandmother     ALLERGIES:  is allergic to amoxicillin.  MEDICATIONS:  Current Outpatient Prescriptions  Medication Sig Dispense Refill  . FLUoxetine (PROZAC) 10 MG tablet     . Levonorgestrel 13.5 MG IUD by Intrauterine route.    . Multiple Vitamins-Minerals (MULTIVITAMIN WITH MINERALS) tablet Take 1  tablet by mouth daily.     No current facility-administered medications for this visit.    REVIEW OF SYSTEMS:   Constitutional: Denies fevers, chills or abnormal night sweats Eyes: Denies blurriness of vision, double vision or watery eyes Ears, nose, mouth, throat, and face: Denies mucositis or sore throat Respiratory: Denies cough, dyspnea or wheezes Cardiovascular: Denies palpitation, chest discomfort or lower extremity swelling Gastrointestinal:  Denies nausea, heartburn or change in bowel habits Skin: Denies  abnormal skin rashes Lymphatics: Denies new lymphadenopathy or easy bruising Neurological:Denies numbness, tingling or new weaknesses Behavioral/Psych: Mood is stable, no new changes  Breast: Palpable lump in the right breast All other systems were reviewed with the patient and are negative.  PHYSICAL EXAMINATION: ECOG PERFORMANCE STATUS: 0 - Asymptomatic  Filed Vitals:   04/15/15 1233  BP: 120/62  Pulse: 79  Temp: 98 F (36.7 C)  Resp: 18   Filed Weights   04/15/15 1233  Weight: 172 lb 8 oz (78.245 kg)    GENERAL:alert, no distress and comfortable SKIN: skin color, texture, turgor are normal, no rashes or significant lesions EYES: normal, conjunctiva are pink and non-injected, sclera clear OROPHARYNX:no exudate, no erythema and lips, buccal mucosa, and tongue normal  NECK: supple, thyroid normal size, non-tender, without nodularity LYMPH:  no palpable lymphadenopathy in the cervical, axillary or inguinal LUNGS: clear to auscultation and percussion with normal breathing effort HEART: regular rate & rhythm and no murmurs and no lower extremity edema ABDOMEN:abdomen soft, non-tender and normal bowel sounds Musculoskeletal:no cyanosis of digits and no clubbing  PSYCH: alert & oriented x 3 with fluent speech NEURO: no focal motor/sensory deficits BREAST: Right breast palpable lump. No palpable axillary or supraclavicular lymphadenopathy (exam performed in the presence of a chaperone)   LABORATORY DATA:  I have reviewed the data as listed Lab Results  Component Value Date   WBC 5.4 04/15/2015   HGB 12.8 04/15/2015   HCT 39.8 04/15/2015   MCV 88.9 04/15/2015   PLT 185 04/15/2015   Lab Results  Component Value Date   NA 141 04/15/2015   K 4.2 04/15/2015   CO2 24 04/15/2015    RADIOGRAPHIC STUDIES: I have personally reviewed the radiological reports and agreed with the findings in the report. Results summarized as above  ASSESSMENT AND PLAN:  Breast cancer of  lower-inner quadrant of right female breast Right breast invasive ductal carcinoma with DCIS felt as a lump at 4:00 position: 1.5 cm mass by ultrasound and 1.7 cm by MRI, yet 100%, PR positive, Ki-67 30%, HER-2 negative T1 cN0 M0 stage IA clinical stage  Radiology and pathology review:Discussed with the patient, the details of pathology including the type of breast cancer,the clinical staging, the significance of ER, PR and HER-2/neu receptors and the implications for treatment. After reviewing the pathology in detail, we proceeded to discuss the different treatment options between surgery, radiation, chemotherapy, antiestrogen therapies.  Recommendations based on multispecialty tumor board: 1. Breast conserving surgery with sentinel lymph node biopsy 2. Genetics consultation 3. Oncotype DX testing to determine if she needs chemotherapy 4. Followed by radiation therapy 4. Followed by antiestrogen therapy  Oncotype DX counseling:I discussed Oncotype DX test. I explained to the patient that this is a 21 gene panel to evaluate patient tumors DNA to calculate recurrence score. This would help determine whether patient has high risk or intermediate risk or low risk breast cancer. She understands that if her tumor was found to be high risk, she would benefit from systemic chemotherapy.  If low risk, no need of chemotherapy. If she was found to be intermediate risk, we would need to evaluate the score as well as other risk factors and determine if an abbreviated chemotherapy may be of benefit.  Return to clinic after surgery to discuss final pathology report and determine adjuvant treatment plan.     All questions were answered. The patient knows to call the clinic with any problems, questions or concerns.    Rulon Eisenmenger, MD 4:28 PM

## 2015-04-15 NOTE — Progress Notes (Signed)
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 female who is seen out of the courtesy of Dr Brantley Stage for an opinion concerning radiation therapy as part of management of patient's recently diagnosed right breast cancer. Earlier this year on self-examination the patient palpated a lesion within the lower inner quadrant of the right breast. She denied any actual pain in this area nipple discharge or bleeding. Patient proceeded to undergo imaging evaluation confirming a solitary lesion in this area. A biopsy of this was performed which revealed invasive ductal carcinoma, grade 2. The tumor was estrogen and progesterone receptor positive, HER-2/neu negative. The patient's imaging studies were reviewed at the multidisciplinary breast conference this morning.  PREVIOUS RADIATION THERAPY: No  PAST MEDICAL HISTORY:  has a past medical history of Breast cancer of lower-outer quadrant of right female breast (04/10/2015); Anxiety; and Depression.    PAST SURGICAL HISTORY: Past Surgical History  Procedure Laterality Date  . Wisdom tooth extraction    . Lasik      FAMILY HISTORY: family history includes Breast cancer in her paternal aunt and paternal grandmother; Hyperlipidemia in her father and mother; Hypertension in her brother, father, and mother; Melanoma in her father; Prostate cancer in her maternal uncle.  SOCIAL HISTORY:  reports that she has never smoked. She does not have any smokeless tobacco history on file. She reports that she drinks alcohol. She reports that she does not use illicit  drugs.  ALLERGIES: Amoxicillin  MEDICATIONS:  Current Outpatient Prescriptions  Medication Sig Dispense Refill  . FLUoxetine (PROZAC) 10 MG tablet     . Levonorgestrel 13.5 MG IUD by Intrauterine route.    . Multiple Vitamins-Minerals (MULTIVITAMIN WITH MINERALS) tablet Take 1 tablet by mouth daily.     No current facility-administered medications for this encounter.    REVIEW OF SYSTEMS:  A 15 point review of systems is documented in the electronic medical record. This was obtained by the nursing staff. However, I reviewed this with the patient to discuss relevant findings and make appropriate changes. The patient palpated the area of concern as above. No significant pain nipple discharge or bleeding   PHYSICAL EXAM:  Vitals - 1 value per visit 4/96/7591  SYSTOLIC 638  DIASTOLIC 62  Pulse 79  Temperature 98  Respirations 18  Weight (lb) 172.5  Height 5' 5"   BMI 28.71  VISIT REPORT    This is a very pleasant 34 year old female in no acute distress. She is accompanied by her husband and mother on evaluation today. Examination of the neck and supraclavicular region reveals no evidence of adenopathy. The axillary areas are free of adenopathy. Examination of the lungs fields them to be clear. The heart has a regular rhythm and rate. Examination of the left breast reveals no palpable mass or nipple discharge or bleeding. Examination of the right breast reveals a palpable nodule in the lower inner quadrant of the right breast. This is estimated to be approximately 1 x 2 cm in size. Inferior to this palpable abnormality is a small biopsy site. No nipple discharge or bleeding noted.   ECOG = 0  LABORATORY DATA:  Lab Results  Component Value Date   WBC 5.4 04/15/2015   HGB 12.8 04/15/2015   HCT 39.8 04/15/2015   MCV 88.9 04/15/2015   PLT 185 04/15/2015   NEUTROABS 2.6 04/15/2015   Lab Results  Component Value Date   NA 141 04/15/2015   K 4.2 04/15/2015   CO2 24 04/15/2015    GLUCOSE 86 04/15/2015   CREATININE 0.8 04/15/2015   CALCIUM 8.9 04/15/2015      RADIOGRAPHY: Mr Breast Bilateral W Wo Contrast  04/10/2015   CLINICAL DATA:  Patient with recent diagnosis right breast invasive ductal carcinoma.  EXAM: BILATERAL BREAST MRI WITH AND WITHOUT CONTRAST  TECHNIQUE: Multiplanar, multisequence MR images of both breasts were obtained prior to and following the intravenous administration of 15 ml of MultiHance.  THREE-DIMENSIONAL MR IMAGE RENDERING ON INDEPENDENT WORKSTATION:  Three-dimensional MR images were rendered by post-processing of the original MR data on an independent workstation. The three-dimensional MR images were interpreted, and findings are reported in the following complete MRI report for this study. Three dimensional images were evaluated at the independent DynaCad workstation  COMPARISON:  Previous exam(s).  FINDINGS: Breast composition: c.  Heterogeneous fibroglandular tissue.  Background parenchymal enhancement: Moderate  Right breast: Within the lower inner right breast middle depth there is a 1.7 x 1.7 x 0.9 cm enhancing mass containing biopsy marking clip, compatible with recently biopsy proven right breast carcinoma. No additional sites of suspicious enhancement identified within the right breast.  Left breast: No mass or abnormal enhancement.  Lymph nodes: No abnormal appearing lymph nodes.  Ancillary findings:  None.  IMPRESSION: Right breast mass compatible with biopsy-proven invasive ductal carcinoma. No additional sites of suspicious enhancement identified within either breast.  RECOMMENDATION: Treatment plan  BI-RADS CATEGORY  6: Known biopsy-proven malignancy.   Electronically Signed   By: [name] M.D.   On: 04/10/2015 15:40   Mm Digital Diagnostic Unilat R  04/06/2015   CLINICAL DATA:  34 year old female -evaluate clip placement following ultrasound-guided right breast biopsy.  EXAM: DIAGNOSTIC RIGHT MAMMOGRAM POST ULTRASOUND BIOPSY  COMPARISON:   Previous exams  FINDINGS: Mammographic images were obtained following ultrasound guided biopsy of 1.5 cm mass at the 4 o'clock position of the right breast.  The ribbon shaped clip is in satisfactory position.  IMPRESSION: Satisfactory clip position following ultrasound-guided right breast biopsy.  Final Assessment: Post Procedure Mammograms for Marker Placement   Electronically Signed   By: Margarette Canada M.D.   On: 04/06/2015 17:02   Mm Radiologist Eval And Mgmt  04/08/2015   EXAM: ESTABLISHED PATIENT OFFICE VISIT - LEVEL II  CHIEF COMPLAINT: 34 year old female seen for evaluation after ultrasound-guided right breast biopsy on 04/06/2015  HISTORY OF PRESENT ILLNESS: 34 year old female who presented with a palpable mass in the lower, inner right breast discovered on clinical examination. Targeted ultrasound demonstrated an irregular mass measuring 15 x 8 x 14 mm at 4 o'clock, 5 cm from the nipple corresponding to the palpable abnormality. No abnormal appearing axillary lymph nodes were identified.  Ultrasound-guided core biopsy demonstrated invasive ductal carcinoma and ductal carcinoma in situ.  EXAM: Examination of the biopsy site in the lower, inner right breast demonstrates a healing incision without signs of infection.  PATHOLOGY: Right breast,4 o'clock, 5 cm from the nipple: Invasive ductal carcinoma and ductal carcinoma in situ.  ASSESSMENT AND PLAN: ASSESSMENT AND PLAN Biopsy-proven carcinoma of the lower, inner right breast. Findings were discussed with the patient and her questions answered. The patient is scheduled  for a breast MRI on Friday, April 10, 2015 at 10:15 a.m. The patient is to be seen in [facility] next Wednesday, April 15, 2015. These appointments were relayed to the patient.   Electronically Signed   By: Pamelia Hoit M.D.   On: 04/08/2015 16:40   US Breast Ltd Uni Left Inc Axilla  04/06/2015   CLINICAL DATA:  34 year old female with palpable mass in the lower inner left  breast identified on self-examination and palpable thickening in the inner right breast discovered on clinical examination.  EXAM: DIGITAL DIAGNOSTIC BILATERAL MAMMOGRAM WITH 3D TOMOSYNTHESIS WITH CAD  ULTRASOUND BILATERAL BREAST  COMPARISON:  None.  ACR Breast Density Category b: There are scattered areas of fibroglandular density.  FINDINGS: 2D and 3D spot compression views of the right breast and routine views of both breasts demonstrate an 8 x 15 mm ill-defined focal asymmetry within the lower inner right breast.  No other suspicious abnormalities are identified within the breasts bilaterally.  Mammographic images were processed with CAD.  On physical exam, a firm palpable area of thickening at the 4 o'clock position of the right breast 5 cm from the nipple is identified.  No suspicious palpable abnormalities are identified within the inner left breast.  Ultrasound is performed, showing a 1.5 x 0.8 x 1.4 cm irregular hypoechoic area/mass at the 4 o'clock position of the right breast 5 cm from the nipple. No enlarged or abnormal appearing right axillary lymph nodes are noted.  No solid or cystic mass, distortion or abnormal areas of shadowing are identified within the inner left breast.  IMPRESSION: Suspicious 1.5 x 0.8 x 1.4 cm hypoechoic area/mass within the lower inner right breast. Tissue sampling is recommended.  No mammographic, suspicious palpable or sonographic abnormality within the inner left breast, in the area of clinical concern.  No mammographic evidence of left breast malignancy.  RECOMMENDATION: Ultrasound-guided right breast biopsy, which will be performed today but dictated in a separate report.  I have discussed the findings and recommendations with the patient. Results were also provided in writing at the conclusion of the visit. If applicable, a reminder letter will be sent to the patient regarding the next appointment.  BI-RADS CATEGORY  4: Suspicious.   Electronically Signed   By: Margarette Canada M.D.   On: 04/06/2015 16:25   US Breast Ltd Uni Right Inc Axilla  04/06/2015   CLINICAL DATA:  34 year old female with palpable mass in the lower inner left breast identified on self-examination and palpable thickening in the inner right breast discovered on clinical examination.  EXAM: DIGITAL DIAGNOSTIC BILATERAL MAMMOGRAM WITH 3D TOMOSYNTHESIS WITH CAD  ULTRASOUND BILATERAL BREAST  COMPARISON:  None.  ACR Breast Density Category b: There are scattered areas of fibroglandular density.  FINDINGS: 2D and 3D spot compression views of the right breast and routine views of both breasts demonstrate an 8 x 15 mm ill-defined focal asymmetry within the lower inner right breast.  No other suspicious abnormalities are identified within the breasts bilaterally.  Mammographic images were processed with CAD.  On physical exam, a firm palpable area of thickening at the 4 o'clock position of the right breast 5 cm from the nipple is identified.  No suspicious palpable abnormalities are identified within the inner left breast.  Ultrasound is performed, showing a 1.5 x 0.8 x 1.4 cm irregular hypoechoic area/mass at the 4 o'clock position of the right breast 5 cm from the nipple. No enlarged or abnormal appearing right axillary lymph nodes are  noted.  No solid or cystic mass, distortion or abnormal areas of shadowing are identified within the inner left breast.  IMPRESSION: Suspicious 1.5 x 0.8 x 1.4 cm hypoechoic area/mass within the lower inner right breast. Tissue sampling is recommended.  No mammographic, suspicious palpable or sonographic abnormality within the inner left breast, in the area of clinical concern.  No mammographic evidence of left breast malignancy.  RECOMMENDATION: Ultrasound-guided right breast biopsy, which will be performed today but dictated in a separate report.  I have discussed the findings and recommendations with the patient. Results were also provided in writing at the conclusion of the visit. If  applicable, a reminder letter will be sent to the patient regarding the next appointment.  BI-RADS CATEGORY  4: Suspicious.   Electronically Signed   By: Margarette Canada M.D.   On: 04/06/2015 16:25   Mm Diag Breast Tomo Bilateral  04/06/2015   CLINICAL DATA:  34 year old female with palpable mass in the lower inner left breast identified on self-examination and palpable thickening in the inner right breast discovered on clinical examination.  EXAM: DIGITAL DIAGNOSTIC BILATERAL MAMMOGRAM WITH 3D TOMOSYNTHESIS WITH CAD  ULTRASOUND BILATERAL BREAST  COMPARISON:  None.  ACR Breast Density Category b: There are scattered areas of fibroglandular density.  FINDINGS: 2D and 3D spot compression views of the right breast and routine views of both breasts demonstrate an 8 x 15 mm ill-defined focal asymmetry within the lower inner right breast.  No other suspicious abnormalities are identified within the breasts bilaterally.  Mammographic images were processed with CAD.  On physical exam, a firm palpable area of thickening at the 4 o'clock position of the right breast 5 cm from the nipple is identified.  No suspicious palpable abnormalities are identified within the inner left breast.  Ultrasound is performed, showing a 1.5 x 0.8 x 1.4 cm irregular hypoechoic area/mass at the 4 o'clock position of the right breast 5 cm from the nipple. No enlarged or abnormal appearing right axillary lymph nodes are noted.  No solid or cystic mass, distortion or abnormal areas of shadowing are identified within the inner left breast.  IMPRESSION: Suspicious 1.5 x 0.8 x 1.4 cm hypoechoic area/mass within the lower inner right breast. Tissue sampling is recommended.  No mammographic, suspicious palpable or sonographic abnormality within the inner left breast, in the area of clinical concern.  No mammographic evidence of left breast malignancy.  RECOMMENDATION: Ultrasound-guided right breast biopsy, which will be performed today but dictated in a  separate report.  I have discussed the findings and recommendations with the patient. Results were also provided in writing at the conclusion of the visit. If applicable, a reminder letter will be sent to the patient regarding the next appointment.  BI-RADS CATEGORY  4: Suspicious.   Electronically Signed   By: Margarette Canada M.D.   On: 04/06/2015 16:25   [place] Rt Breast Bx W Loc Dev 1st Lesion Img Bx Spec US Guide  04/06/2015   CLINICAL DATA:  34 year old female for tissue sampling of 1.5 cm suspicious mass in the lower inner right breast.  EXAM: ULTRASOUND GUIDED RIGHT BREAST CORE NEEDLE BIOPSY  COMPARISON:  Previous exams.  FINDINGS: I met with the patient and we discussed the procedure of ultrasound-guided biopsy, including benefits and alternatives. We discussed the high likelihood of a successful procedure. We discussed the risks of the procedure, including infection, bleeding, tissue injury, clip migration, and inadequate sampling. Informed written consent was given. The usual time-out protocol was  performed immediately prior to the procedure.  Using sterile technique and 2% Lidocaine as local anesthetic, under direct ultrasound visualization, a 12 gauge spring-loaded device was used to perform biopsy of the 1.5 cm irregular hypoechoic mass at the 4 o'clock position of the right breast using a lateral approach. At the conclusion of the procedure a ribbon shaped tissue marker clip was deployed into the biopsy cavity. Follow up 2 view mammogram was performed and dictated separately.  IMPRESSION: Ultrasound guided biopsy of lower inner right breast mass. No apparent complications.  Pathology will be followed.   Electronically Signed   By: Margarette Canada M.D.   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 The patient would appear to be a good candidate for breast conservation with breast conserving surgery and radiation  therapy. I discussed the treatment course side effects and potential toxicities of radiation therapy in this situation with the patient and her family. The patient appears receptive to this treatment approach. She will proceed with partial mastectomy and sentinel lymph node biopsy. She will also proceed with genetics consultation and Oncotype DX testing. antiestrogen therapy to follow radiation treatment.  PLAN: Patient will be seen in the postoperative setting for further evaluation.     ------------------------------------------------  Blair Promise, PhD, MD

## 2015-04-15 NOTE — H&P (Signed)
Kara Meadows 04/15/2015 7:50 AM Location: [facility] Patient #: [id number] DOB: [birth year] Undefined / Language: Kara Meadows / Race: Undefined Female History of Present Illness Kara Meadows A. Kara Such MD; 04/15/2015 3:11 PM) Patient words: Diagnosis Breast, right, needle core biopsy, 4 o'clock - INVASIVE DUCTAL CARCINOMA. - DUCTAL CARCINOMA IN SITU. - SEE COMMENT. Microscopic Comment Immunohistochemical stains are performed for calponin, smooth muscle myosin and p63. The stains demonstrate a myoepithelial layer in some of the tumor nests while that layer is absent in other tumor nests. The morphology coupled with the staining pattern is consistent with the above diagnosis. Although definitive grading of breast carcinoma is best done on excision, the features of the invasive tumor from the right 4 o'clock biopsy are compatible with a grade II breast carcinoma. Breast prognostic markers will be performed and reported in an addendum. Findings are called to the [facility] on 04/08/15. Dr. Saralyn Pilar has seen this case in consultation with agreement. (RAH:gt, 04/08/15) Willeen Niece MD Pathologist, Electronic Signature (Case signed 04                  CLINICAL DATA: Patient with recent diagnosis right breast invasive ductal carcinoma.  EXAM: BILATERAL BREAST MRI WITH AND WITHOUT CONTRAST  TECHNIQUE: Multiplanar, multisequence MR images of both breasts were obtained prior to and following the intravenous administration of 15 ml of MultiHance.  THREE-DIMENSIONAL MR IMAGE RENDERING ON INDEPENDENT WORKSTATION:  Three-dimensional MR images were rendered by post-processing of the original MR data on an independent workstation. The three-dimensional MR images were interpreted, and findings are reported in the following complete MRI report for this study. Three dimensional images were evaluated at the independent DynaCad workstation  COMPARISON:  Previous exam(s).  FINDINGS: Breast composition: c. Heterogeneous fibroglandular tissue.  Background parenchymal enhancement: Moderate  Right breast: Within the lower inner right breast middle depth there is a 1.7 x 1.7 x 0.9 cm enhancing mass containing biopsy marking clip, compatible with recently biopsy proven right breast carcinoma. No additional sites of suspicious enhancement identified within the right breast.  Left breast: No mass or abnormal enhancement.  Lymph nodes: No abnormal appearing lymph nodes.  Ancillary findings: None.  IMPRESSION: Right breast mass compatible with biopsy-proven invasive ductal carcinoma. No additional sites of suspicious enhancement identified within either breast.  RECOMMENDATION: Treatment plan  BI-RADS CATEGORY 6: Known biopsy-proven malignancy.   Electronically Signed By: [name] M.D. On: 04/10/2015 15:40  PT SEEN AT THE REQUEST OF [name] FOR RIGHT BREAST CANCER.  The patient is a 34 year old female who presents with breast cancer. The patient is being seen for a consultation for Stage I of the right breast. Tumor markers include estrogen receptor positive, progesterone receptor negative and HER-2/neu negative. Initial presentation was 1 month(s) ago for breast mass on self-examination and an abnormal mammogram. Other Problems [name] [employer] [place], RMA; 04/15/2015 7:50 AM) Anxiety Disorder Breast Cancer Depression Lump In Breast Migraine Headache  Past Surgical History [name] [place], RMA; 04/15/2015 7:50 AM) Breast Biopsy Right. Oral Surgery  Diagnostic Studies History [name] [place], [place]; 04/15/2015 7:50 AM) Colonoscopy never Pap Smear 1-5 years ago  Social History [name] [place], [place]; 04/15/2015 7:50 AM) Alcohol use Occasional alcohol use. Caffeine use Carbonated beverages, Coffee. Tobacco use Never smoker.  Family History [name] [place], [place]; 04/15/2015 7:50 AM) Alcohol Abuse Family Members In  General, Father, Mother. Arthritis Mother. Breast Cancer Family Members In General. Cerebrovascular Accident Family Members In General. Heart Disease Family Members In General. Hypertension Brother, Family  Members In General, Father, Mother. Melanoma Father.  Pregnancy / Birth History [name] [place], [place]; 04/15/2015 7:50 AM) Age at menarche 54 years. Contraceptive History Intrauterine device, Oral contraceptives. Gravida 0 Irregular periods Para 0     Review of Systems [name] RMA; 04/15/2015 7:50 AM) General Present- Fatigue. Not Present- Appetite Loss, Chills, Fever, Night Sweats, Weight Gain and Weight Loss. Skin Not Present- Change in Wart/Mole, Dryness, Hives, Jaundice, New Lesions, Non-Healing Wounds, Rash and Ulcer. HEENT Not Present- Earache, Hearing Loss, Hoarseness, Nose Bleed, Oral Ulcers, Ringing in the Ears, Seasonal Allergies, Sinus Pain, Sore Throat, Visual Disturbances, Wears glasses/contact lenses and Yellow Eyes. Respiratory Present- Snoring. Not Present- Bloody sputum, Chronic Cough, Difficulty Breathing and Wheezing. Breast Present- Breast Mass. Not Present- Breast Pain, Nipple Discharge and Skin Changes. Cardiovascular Not Present- Chest Pain, Difficulty Breathing Lying Down, Leg Cramps, Palpitations, Rapid Heart Rate, Shortness of Breath and Swelling of Extremities. Gastrointestinal Not Present- Abdominal Pain, Bloating, Bloody Stool, Change in Bowel Habits, Chronic diarrhea, Constipation, Difficulty Swallowing, Excessive gas, Gets full quickly at meals, Hemorrhoids, Indigestion, Nausea, Rectal Pain and Vomiting. Female Genitourinary Not Present- Frequency, Nocturia, Painful Urination, Pelvic Pain and Urgency. Musculoskeletal Not Present- Back Pain, Joint Pain, Joint Stiffness, Muscle Pain, Muscle Weakness and Swelling of Extremities. Psychiatric Present- Anxiety, Change in Sleep Pattern and Fearful. Not Present- Bipolar, Depression and Frequent  crying.   Physical Exam (Aries Kasa A. Erven Ramson MD; 04/15/2015 3:11 PM)  General Mental Status-Alert. General Appearance-Consistent with stated age. Hydration-Well hydrated. Voice-Normal.  Head and Neck Head-normocephalic, atraumatic with no lesions or palpable masses. Trachea-midline. Thyroid Gland Characteristics - normal size and consistency.  Chest and Lung Exam Chest and lung exam reveals -quiet, even and easy respiratory effort with no use of accessory muscles and on auscultation, normal breath sounds, no adventitious sounds and normal vocal resonance. Inspection Chest Wall - Normal. Back - normal.  Breast Note: RIGHT BREAST 4 [place] 1 CM MOBILE MASS LEFT BREAST NORMAL   Cardiovascular Cardiovascular examination reveals -normal heart sounds, regular rate and rhythm with no murmurs and normal pedal pulses bilaterally.  Neurologic Neurologic evaluation reveals -alert and oriented x 3 with no impairment of recent or remote memory. Mental Status-Normal.  Lymphatic Axillary  General Axillary Region: Bilateral - Description - Normal. Tenderness - Non Tender.    Assessment & Plan (Conor Filsaime A. Kazi Reppond MD; 04/15/2015 3:12 PM)  BREAST CANCER, STAGE 1, RIGHT (174.9  C50.911) Impression: PT DESIRES RIGHT BREAST LUMPECTOMY AND SLN mapping Discussed mastectomy and reconstruction and genetics. Risk of lumpectomy include bleeding, infection, seroma, more surgery, use of seed/wire, wound care, cosmetic deformity and the need for other treatments, death , blood clots, death. Pt agrees to proceed. Risk of sentinel lymph node mapping include bleeding, infection, lymphedema, shoulder pain. stiffness, dye allergy. cosmetic deformity , blood clots, death, need for more surgery. Pt agres to proceed.

## 2015-04-15 NOTE — Therapy (Signed)
[place], [place], [address] Phone: [phone]   Fax:  [phone]  Physical Therapy Evaluation  Patient Details  Name: Kara Meadows MRN: [id number] Date of Birth: [birth year] Referring Provider:  Erroll Luna, MD  Encounter Date: 04/15/2015      PT End of Session - 04/15/15 1526    Visit Number 1   Number of Visits 1   PT Start Time 1416   PT Stop Time 1444   PT Time Calculation (min) 28 min   Activity Tolerance Patient tolerated treatment well   Behavior During Therapy [facility] for tasks assessed/performed      Past Medical History  Diagnosis Date  . Breast cancer of lower-outer quadrant of right female breast 04/10/2015  . Anxiety   . Depression     Past Surgical History  Procedure Laterality Date  . Wisdom tooth extraction    . Lasik      There were no vitals filed for this visit.  Visit Diagnosis:  Breast cancer of lower-inner quadrant of right female breast - Plan: PT plan of care cert/re-cert      Subjective Assessment - 04/15/15 1519    Subjective Patient is being seen today for a baseline assessment of her newly diagnosed right breast cancer.   Patient is accompained by: Family member   Pertinent History Diagnosed 04/08/15 with right ER/PR positive, HER2 negative breast cancer with a Ki67 of 30% measuring 1.5 cm.   Patient Stated Goals Learn post op shoulder ROM HEP and lymphedema risk reduction   Currently in Pain? No/denies            [facility] PT Assessment - 04/15/15 0001    Assessment   Medical Diagnosis Right breast cancer   Onset Date 04/08/15   Precautions   Precautions Other (comment)  active breast cancer   Restrictions   Weight Bearing Restrictions No   Balance Screen   Has the patient fallen in the past 6 months No   Has the patient had a decrease in activity level because of a fear of falling?  No   Is the patient reluctant to leave their home because  of a fear of falling?  No   Home Environment   Living Enviornment Private residence   Living Arrangements Spouse/significant other   Available Help at Discharge Family   Prior Function   Level of Independence Independent with basic ADLs   Vocation Full time employment   Conservation officer, nature; desk work   Leisure She runs or does other cardio 4x/wk for 30-120 min and strengthening 2x/wk   Cognition   Overall Cognitive Status Within Functional Limits for tasks assessed   Posture/Postural Control   Posture/Postural Control No significant limitations   ROM / Strength   AROM / PROM / Strength AROM;Strength   AROM   AROM Assessment Site Shoulder   Right/Left Shoulder Right;Left   Right Shoulder Extension 57 Degrees   Right Shoulder Flexion 153 Degrees   Right Shoulder ABduction 160 Degrees   Right Shoulder Internal Rotation 72 Degrees   Right Shoulder External Rotation 88 Degrees   Left Shoulder Extension 58 Degrees   Left Shoulder Flexion 143 Degrees   Left Shoulder ABduction 155 Degrees   Left Shoulder Internal Rotation 75 Degrees   Left Shoulder External Rotation 80 Degrees   Strength   Overall Strength Within functional limits for tasks performed           LYMPHEDEMA/ONCOLOGY QUESTIONNAIRE -  04/15/15 1524    Type   Cancer Type Right breast   Lymphedema Assessments   Lymphedema Assessments Upper extremities   Right Upper Extremity Lymphedema   10 cm Proximal to Olecranon Process 28.4 cm   Olecranon Process 25.2 cm   10 cm Proximal to Ulnar Styloid Process 21.8 cm   Just Proximal to Ulnar Styloid Process 15 cm   Across Hand at [employer] 18.1 cm   At [place] of 2nd Digit 5.8 cm   Left Upper Extremity Lymphedema   10 cm Proximal to Olecranon Process 28.3 cm   Olecranon Process 25 cm   10 cm Proximal to Ulnar Styloid Process 21.5 cm   Just Proximal to Ulnar Styloid Process 15.1 cm   Across Hand at [employer] 17.8 cm   At [place] of 2nd Digit  5.5 cm      Patient was instructed today in a home exercise program today for post op shoulder range of motion. These included active assist shoulder flexion in sitting, scapular retraction, wall walking with shoulder abduction, and hands behind head external rotation.  She was encouraged to do these twice a day, holding 3 seconds and repeating 5 times when permitted by her physician.         PT Education - 04/15/15 1525    Education provided Yes   Education Details Lymphedema risk reduction and post op shoulder ROM HEP   Person(s) Educated Patient;Spouse;Parent(s)   Methods Explanation;Demonstration;Handout   Comprehension Verbalized understanding              Breast Clinic Goals - 04/15/15 1530    Patient will be able to verbalize understanding of pertinent lymphedema risk reduction practices relevant to her diagnosis specifically related to skin care.   Time 1   Period Days   Status Achieved   Patient will be able to return demonstrate and/or verbalize understanding of the post-op home exercise program related to regaining shoulder range of motion.   Time 1   Period Days   Status Achieved   Patient will be able to verbalize understanding of the importance of attending the postoperative After Breast Cancer Class for further lymphedema risk reduction education and therapeutic exercise.   Time 1   Period Days   Status Achieved              Plan - 04/15/15 1526    Clinical Impression Statement Patient was seen today for a baseline assessment of her newly diagnosed right breast cancer.  She is planning to have a right lumpectomy with a sentinel node biopsy and Oncotype testing followed by radiation and anti-estrogen therapy.  She will benefit from post op PT to regain shoulder ROM and strength.   Pt will benefit from skilled therapeutic intervention in order to improve on the following deficits Decreased range of motion;Increased edema;Decreased knowledge of  precautions;Impaired UE functional use;Pain;Decreased strength   Rehab Potential Excellent   Clinical Impairments Affecting Rehab Potential none   PT Frequency One time visit   PT Treatment/Interventions Patient/family education;Therapeutic exercise   Consulted and Agree with Plan of Care Patient;Family member/caregiver   Family Member Consulted Husband and mother     Patient will follow up at outpatient cancer rehab if needed following surgery.  If the patient requires physical therapy at that time, a specific plan will be dictated and sent to the referring physician for approval. The patient was educated today on appropriate basic range of motion exercises to begin post operatively and  the importance of attending the After Breast Cancer class following surgery.  Patient was educated today on lymphedema risk reduction practices as it pertains to recommendations that will benefit the patient immediately following surgery.  She verbalized good understanding.  No additional physical therapy is indicated at this time.       Problem List Patient Active Problem List   Diagnosis Date Noted  . Breast cancer of lower-inner quadrant of right female breast 04/10/2015  . Right leg pain 05/05/2014    Annia Friendly, PT 04/15/2015, 3:33 PM  [place], [place], [address] Phone: [phone]   Fax:  [phone]

## 2015-04-15 NOTE — Assessment & Plan Note (Signed)
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 After reviewing the pathology in detail, we proceeded to discuss the different treatment options between surgery, radiation, chemotherapy, antiestrogen therapies.  Recommendations based on multispecialty tumor board: 1. Breast conserving surgery with sentinel lymph node biopsy 2. Genetics consultation 3. Oncotype DX testing to determine if she needs chemotherapy 4. Followed by radiation therapy 4. Followed by antiestrogen therapy  Oncotype DX counseling:I discussed Oncotype DX test. I explained to the patient that this is a 21 gene panel to evaluate patient tumors DNA to calculate recurrence score. This would help determine whether patient has high risk or intermediate risk or low risk breast cancer. She understands that if her tumor was found to be high risk, she would benefit from systemic chemotherapy. If low risk, no need of chemotherapy. If she was found to be intermediate risk, we would need to evaluate the score as well as other risk factors and determine if an abbreviated chemotherapy may be of benefit.  Return to clinic after surgery to discuss final pathology report and determine adjuvant treatment plan.

## 2015-04-17 ENCOUNTER — Encounter: Payer: Self-pay | Admitting: Genetic Counselor

## 2015-04-17 ENCOUNTER — Other Ambulatory Visit: Payer: BC Managed Care – PPO

## 2015-04-17 ENCOUNTER — Ambulatory Visit (HOSPITAL_BASED_OUTPATIENT_CLINIC_OR_DEPARTMENT_OTHER): Payer: BC Managed Care – PPO | Admitting: Genetic Counselor

## 2015-04-17 DIAGNOSIS — Z803 Family history of malignant neoplasm of breast: Secondary | ICD-10-CM

## 2015-04-17 DIAGNOSIS — Z315 Encounter for genetic counseling: Secondary | ICD-10-CM

## 2015-04-17 DIAGNOSIS — Z8042 Family history of malignant neoplasm of prostate: Secondary | ICD-10-CM

## 2015-04-17 DIAGNOSIS — C50311 Malignant neoplasm of lower-inner quadrant of right female breast: Secondary | ICD-10-CM

## 2015-04-17 NOTE — Telephone Encounter (Signed)
Call to pt, informed meds have arrived and can be picked up next week. Stated has off next Thursday, will come in after [facility]t [facility]

## 2015-04-17 NOTE — Progress Notes (Addendum)
REFERRING PROVIDER: Carlos Levering, PA-C [address] [place], [place] [address]   Nicholas Lose, MD  PRIMARY PROVIDER:  Carlos Levering, PA-C  PRIMARY REASON FOR VISIT:  1. 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 Family history of breast cancer   3. Family history of prostate cancer      HISTORY OF PRESENT ILLNESS:   Ms. Armstead, a 34 y.o. female, was seen for a [place] cancer genetics consultation at the request of Dr. Lindi Adie due to a personal and family history of cancer.  Ms. Motz presents to clinic today to discuss the possibility of a hereditary predisposition to cancer, genetic testing, and to further clarify her future cancer risks, as well as potential cancer risks for family members.   In 2016, at the age of 1, Ms. Novicki was diagnosed with invasive ductal carcinoma of the right breast.  The tumor is ER+/PR+/Her2-. This will be treated with lumpectomy, depending on genetics.  She will have oncotype testing and based on that will depend on whether she has chemotherapy.  She will receive radiation therapy.  Her surgery is scheduled for Apr 30, 2015.    CANCER HISTORY:    Breast cancer of lower-inner quadrant of right female breast   04/06/2015 Initial Diagnosis Right breast biopsy 4:00: Invasive ductal carcinoma with DCIS grade 2, ER 100%, PR 100%, Ki-67 30%, HER-2 negative   04/10/2015 Breast MRI Right breast mass 1.7 x 1.7 x 0.9 cm, lymph nodes negative     HORMONAL RISK FACTORS:  Menarche was at age 43.  First live birth at age N/A.  OCP use for approximately 16 years.  Ovaries intact: yes.  Hysterectomy: no.  Menopausal status: premenopausal.  HRT use: 0 years. Colonoscopy: no; not examined. Mammogram within the last year: yes. Number of breast biopsies: 1. Up to date with pelvic exams:  yes. Any excessive radiation exposure in the past:  no  Past Medical History  Diagnosis Date  . Breast cancer of lower-outer  quadrant of right female breast 04/10/2015  . Anxiety   . Depression   . Breast cancer 2016    age 70; ER+/PR+/Her2-  . Family history of breast cancer     Past Surgical History  Procedure Laterality Date  . Wisdom tooth extraction    . Lasik      History   Social History  . Marital Status: Married    Spouse Name: N/A  . Number of Children: 0  . Years of Education: N/A   Social History Main Topics  . Smoking status: Never Smoker   . Smokeless tobacco: Not on file  . Alcohol Use: Yes     Comment: 2  . Drug Use: No  . Sexual Activity: Not on file   Other Topics Concern  . None   Social History Narrative     FAMILY HISTORY:  We obtained a detailed, 4-generation family history.  Significant diagnoses are listed below: Family History  Problem Relation Age of Onset  . Hyperlipidemia Mother   . Hypertension Mother   . Hypertension Father   . Hyperlipidemia Father   . Melanoma Father 25  . Hypertension Brother   . Prostate cancer Maternal Uncle 56  . Breast cancer Paternal Aunt 42  . Breast cancer Paternal Grandmother 58  . COPD Maternal Grandfather   . Heart attack Paternal Grandfather    The patient has one brother who is healthy.  Her parents are living.  Her father  was diagnosed with melanoma at 68.  He had three sisters and a brother.  One sister was diagnosed with breast cancer at age 35.  His mother was diagnosed with breast cancer at age 56, and his father died of a heart attack.  Ms. Remmert mother is alive and has two brothers and two sisters.  One brother had prostate cancer at 82.  The remainder of the family history is non-contributory. Patient's maternal ancestors are of [place] and [place] descent, and paternal ancestors are of Estate agent and IRish descent. There is no reported Ashkenazi Jewish ancestry. There is no known consanguinity.  GENETIC COUNSELING ASSESSMENT: AASHVI REZABEK is a 34 y.o. female with a personal and family history of cancer which  somewhat suggestive of a hereditary cancer syndrome and predisposition to cancer. We, therefore, discussed and recommended the following at today's visit.   DISCUSSION: We reviewed the characteristics, features and inheritance patterns of hereditary cancer syndromes. Based on her personal and family history we discussed BRCA mutations, as well as other hereditary cancer genes.  We also discussed genetic testing, including the appropriate family members to test, the process of testing, insurance coverage and turn-around-time for results. We discussed the implications of a negative, positive and/or variant of uncertain significant result. We recommended Ms. Beamon pursue genetic testing for the Breast/Ovarian Cancer gene panel. The Breast/Ovarian gene panel offered by [facility] includes sequencing and rearrangement analysis for the following 21 genes:  ATM, BARD1, BRCA1, BRCA2, BRIP1, CDH1, CHEK2, EPCAM, FANCC, MLH1, MSH2, MSH6, NBN, PALB2, PMS2, PTEN, RAD51C, RAD51D, STK11, TP53, and XRCC2.     PLAN: After considering the risks, benefits, and limitations, Ms. Matuszak  provided informed consent to pursue genetic testing and the blood sample was sent to [facility] for analysis of the Breast/Ovarian cancer gene panel. We have asked [facility] to place these on STAT, to try to get the results back in time for her surgery.  Results should be available within approximately 2-3 weeks' time, at which point they will be disclosed by telephone to Ms. Alleman, as will any additional recommendations warranted by these results. Ms. Printz will receive a summary of her genetic counseling visit and a copy of her results once available. This information will also be available in Epic. We encouraged Ms. Cadieux to remain in contact with cancer genetics annually so that we can continuously update the family history and inform her of any changes in cancer genetics and testing that may be of benefit for her family. Ms. Weide  questions were answered to her satisfaction today. Our contact information was provided should additional questions or concerns arise.  Lastly, we encouraged Ms. Fermin to remain in contact with cancer genetics annually so that we can continuously update the family history and inform her of any changes in cancer genetics and testing that may be of benefit for this family.   Ms.  Pickert questions were answered to her satisfaction today. Our contact information was provided should additional questions or concerns arise. Thank you for the referral and allowing [place] to share in the care of your patient.   Merrik Puebla P. Florene [place], [place], [facility] Certified Genetic Counselor [email or web address] phone: [phone]  The patient was seen for a total of 45 minutes in face-to-face genetic counseling.  This patient was discussed with Drs. Magrinat, Lindi Adie and/or Burr Medico who agrees with the above.    _______________________________________________________________________ For Office Staff:  Number of people involved in session: 1 Was an Intern/ student involved with case: no

## 2015-04-20 ENCOUNTER — Telehealth: Payer: Self-pay | Admitting: *Deleted

## 2015-04-20 NOTE — Telephone Encounter (Signed)
Left message for a return phone call from [facility] 04/15/15.  Awaiting patient response. POF generated to schedule follow up with Dr. Lindi Adie post op.

## 2015-04-21 ENCOUNTER — Telehealth: Payer: Self-pay | Admitting: Hematology and Oncology

## 2015-04-21 NOTE — Telephone Encounter (Signed)
per pof to sch pt appt-cld & left pt a message of time & date of appt °

## 2015-04-23 NOTE — Telephone Encounter (Signed)
Pt in today to pick up meds. Discussed Zepatier in detail, med regime, potential S/E, ADR's, drug/drug interactions, calendar, needed labs and f/u appt.  Given calendar, standing lab orders for entire treatment and scheduled for 5 week f/u. Verbalized understanding of all information discussed and agreement with plan. Plans to start tomorrow

## 2015-04-24 ENCOUNTER — Encounter (HOSPITAL_BASED_OUTPATIENT_CLINIC_OR_DEPARTMENT_OTHER): Payer: Self-pay | Admitting: *Deleted

## 2015-04-24 ENCOUNTER — Telehealth: Payer: Self-pay | Admitting: Hematology and Oncology

## 2015-04-24 NOTE — Progress Notes (Signed)
Spoke with Dr.Massagee - pt does not need pregnancy test.

## 2015-04-24 NOTE — Telephone Encounter (Signed)
Faxed pt medical records to Dr.Georgiade's [phone].  Ordered slides and scans to be fedex'ed.  After review of the records Dr. Kandis Ban office will call pt with appt.

## 2015-04-28 ENCOUNTER — Encounter: Payer: Self-pay | Admitting: Genetic Counselor

## 2015-04-28 ENCOUNTER — Telehealth: Payer: Self-pay | Admitting: Genetic Counselor

## 2015-04-28 DIAGNOSIS — Z1379 Encounter for other screening for genetic and chromosomal anomalies: Secondary | ICD-10-CM

## 2015-04-28 DIAGNOSIS — Z803 Family history of malignant neoplasm of breast: Secondary | ICD-10-CM

## 2015-04-28 DIAGNOSIS — C50311 Malignant neoplasm of lower-inner quadrant of right female breast: Secondary | ICD-10-CM

## 2015-04-28 NOTE — Telephone Encounter (Signed)
Revealed negative genetic testing on the Breast/Ovarian cancer panel.

## 2015-04-28 NOTE — Progress Notes (Signed)
HPI: Ms. Verhagen was previously seen in the [place] clinic due to a personal and family history of cancer and concerns regarding a hereditary predisposition to cancer. Please refer to our prior cancer genetics clinic note for more information regarding Ms. Mencer's medical, social and family histories, and our assessment and recommendations, at the time. Ms. Tadros recent genetic test results were disclosed to her, as were recommendations warranted by these results. These results and recommendations are discussed in more detail below.  GENETIC TEST RESULTS: At the time of Ms. Hunley's visit, we recommended she pursue genetic testing of the Breast/Ovarian cancer gene panel. The Breast/Ovarian gene panel offered by [facility] includes sequencing and rearrangement analysis for the following 21 genes:  ATM, BARD1, BRCA1, BRCA2, BRIP1, CDH1, CHEK2, EPCAM, FANCC, MLH1, MSH2, MSH6, NBN, PALB2, PMS2, PTEN, RAD51C, RAD51D, STK11, TP53, and XRCC2.   The report date is Apr 27, 2015.  Genetic testing was normal, and did not reveal a deleterious mutation in these genes. The test report has been scanned into EPIC and is located under the Media tab.   We discussed with Ms. Madura that since the current genetic testing is not perfect, it is possible there may be a gene mutation in one of these genes that current testing cannot detect, but that chance is small. We also discussed, that it is possible that another gene that has not yet been discovered, or that we have not yet tested, is responsible for the cancer diagnoses in the family, and it is, therefore, important to remain in touch with cancer genetics in the future so that we can continue to offer Ms. Baade the most up to date genetic testing.   CANCER SCREENING RECOMMENDATIONS: This result is reassuring and indicates that Ms. Desena likely does not have an increased risk for a future cancer due to a mutation in one of these genes. This normal test  also suggests that Ms. Kissler's cancer was most likely not due to an inherited predisposition associated with one of these genes.  Most cancers happen by chance and this negative test suggests that her cancer falls into this category.  We, therefore, recommended she continue to follow the cancer management and screening guidelines provided by her oncology and primary healthcare provider.   RECOMMENDATIONS FOR FAMILY MEMBERS: Women in this family might be at some increased risk of developing cancer, over the general population risk, simply due to the family history of cancer. We recommended women in this family have a yearly mammogram beginning at age 65, or 13 years younger than the earliest onset of cancer, an an annual clinical breast exam, and perform monthly breast self-exams. Women in this family should also have a gynecological exam as recommended by their primary provider. All family members should have a colonoscopy by age 25.  FOLLOW-UP: Lastly, we discussed with Ms. Dolezal that cancer genetics is a rapidly advancing field and it is possible that new genetic tests will be appropriate for her and/or her family members in the future. We encouraged her to remain in contact with cancer genetics on an annual basis so we can update her personal and family histories and let her know of advances in cancer genetics that may benefit this family.   Our contact number was provided. Ms. Kuan questions were answered to her satisfaction, and she knows she is welcome to call us at anytime with additional questions or concerns.   Roma Kayser, MS, [facility] Certified Genetic Counselor [email or web address]

## 2015-04-29 ENCOUNTER — Inpatient Hospital Stay: Admission: RE | Admit: 2015-04-29 | Payer: BC Managed Care – PPO | Source: Ambulatory Visit

## 2015-04-29 ENCOUNTER — Ambulatory Visit
Admission: RE | Admit: 2015-04-29 | Discharge: 2015-04-29 | Disposition: A | Discharge status: Home / Self Care | Payer: BC Managed Care – PPO | Source: Ambulatory Visit | Attending: Surgery | Admitting: Surgery

## 2015-04-29 DIAGNOSIS — C50911 Malignant neoplasm of unspecified site of right female breast: Secondary | ICD-10-CM

## 2015-04-30 ENCOUNTER — Encounter (HOSPITAL_BASED_OUTPATIENT_CLINIC_OR_DEPARTMENT_OTHER)
Admission: RE | Disposition: A | Discharge status: Home / Self Care | Payer: Self-pay | Source: Ambulatory Visit | Attending: Surgery

## 2015-04-30 ENCOUNTER — Ambulatory Visit (HOSPITAL_COMMUNITY)
Admission: RE | Admit: 2015-04-30 | Discharge: 2015-04-30 | Disposition: A | Discharge status: Home / Self Care | Payer: BC Managed Care – PPO | Source: Ambulatory Visit | Attending: Surgery | Admitting: Surgery

## 2015-04-30 ENCOUNTER — Ambulatory Visit (HOSPITAL_BASED_OUTPATIENT_CLINIC_OR_DEPARTMENT_OTHER)
Admission: RE | Admit: 2015-04-30 | Discharge: 2015-04-30 | Disposition: A | Discharge status: Home / Self Care | Payer: BC Managed Care – PPO | Source: Ambulatory Visit | Attending: Surgery | Admitting: Surgery

## 2015-04-30 ENCOUNTER — Ambulatory Visit (HOSPITAL_BASED_OUTPATIENT_CLINIC_OR_DEPARTMENT_OTHER): Payer: BC Managed Care – PPO | Admitting: Certified Registered"

## 2015-04-30 ENCOUNTER — Encounter (HOSPITAL_BASED_OUTPATIENT_CLINIC_OR_DEPARTMENT_OTHER): Payer: Self-pay | Admitting: Certified Registered"

## 2015-04-30 ENCOUNTER — Ambulatory Visit
Admission: RE | Admit: 2015-04-30 | Discharge: 2015-04-30 | Disposition: A | Discharge status: Home / Self Care | Payer: BC Managed Care – PPO | Source: Ambulatory Visit | Attending: Surgery | Admitting: Surgery

## 2015-04-30 DIAGNOSIS — G43909 Migraine, unspecified, not intractable, without status migrainosus: Secondary | ICD-10-CM | POA: Diagnosis not present

## 2015-04-30 DIAGNOSIS — F329 Major depressive disorder, single episode, unspecified: Secondary | ICD-10-CM | POA: Diagnosis not present

## 2015-04-30 DIAGNOSIS — F419 Anxiety disorder, unspecified: Secondary | ICD-10-CM | POA: Insufficient documentation

## 2015-04-30 DIAGNOSIS — Z170 Estrogen receptor positive status [ER+]: Secondary | ICD-10-CM | POA: Insufficient documentation

## 2015-04-30 DIAGNOSIS — C50911 Malignant neoplasm of unspecified site of right female breast: Secondary | ICD-10-CM | POA: Diagnosis not present

## 2015-04-30 HISTORY — PX: RADIOACTIVE SEED GUIDED PARTIAL MASTECTOMY WITH AXILLARY SENTINEL LYMPH NODE BIOPSY: SHX6520

## 2015-04-30 SURGERY — RADIOACTIVE SEED GUIDED PARTIAL MASTECTOMY WITH AXILLARY SENTINEL LYMPH NODE BIOPSY
Anesthesia: Regional | Site: Breast | Laterality: Right

## 2015-04-30 MED ORDER — MIDAZOLAM HCL 2 MG/2ML IJ SOLN
INTRAMUSCULAR | Status: AC
  Filled 2015-04-30: qty 2

## 2015-04-30 MED ORDER — FENTANYL CITRATE (PF) 100 MCG/2ML IJ SOLN
INTRAMUSCULAR | Status: AC
  Filled 2015-04-30: qty 4

## 2015-04-30 MED ORDER — FENTANYL CITRATE (PF) 100 MCG/2ML IJ SOLN
INTRAMUSCULAR | Status: AC
  Filled 2015-04-30: qty 6

## 2015-04-30 MED ORDER — OXYCODONE HCL 5 MG/5ML PO SOLN: 5.0000 mg | Freq: Once | ORAL | Status: DC | PRN

## 2015-04-30 MED ORDER — METHYLENE BLUE 1 % INJ SOLN
INTRAMUSCULAR | Status: AC
  Filled 2015-04-30: qty 10

## 2015-04-30 MED ORDER — OXYCODONE-ACETAMINOPHEN 5-325 MG PO TABS: 1.0000 | ORAL_TABLET | ORAL | Status: DC | PRN

## 2015-04-30 MED ORDER — DEXAMETHASONE SODIUM PHOSPHATE 4 MG/ML IJ SOLN
INTRAMUSCULAR | Status: DC | PRN
  Administered 2015-04-30: 10 mg via INTRAVENOUS

## 2015-04-30 MED ORDER — BUPIVACAINE-EPINEPHRINE (PF) 0.5% -1:200000 IJ SOLN
INTRAMUSCULAR | Status: DC | PRN
  Administered 2015-04-30: 30 mL via PERINEURAL

## 2015-04-30 MED ORDER — ACETAMINOPHEN 160 MG/5ML PO SOLN: 325.0000 mg | ORAL | Status: DC | PRN

## 2015-04-30 MED ORDER — FENTANYL CITRATE (PF) 100 MCG/2ML IJ SOLN
50.0000 ug | INTRAMUSCULAR | Status: DC | PRN
  Administered 2015-04-30: 100 ug via INTRAVENOUS

## 2015-04-30 MED ORDER — FENTANYL CITRATE (PF) 100 MCG/2ML IJ SOLN
INTRAMUSCULAR | Status: DC | PRN
  Administered 2015-04-30: 50 ug via INTRAVENOUS

## 2015-04-30 MED ORDER — ACETAMINOPHEN 10 MG/ML IV SOLN
INTRAVENOUS | Status: AC
  Filled 2015-04-30: qty 100

## 2015-04-30 MED ORDER — ACETAMINOPHEN 325 MG PO TABS: 325.0000 mg | ORAL_TABLET | ORAL | Status: DC | PRN

## 2015-04-30 MED ORDER — FENTANYL CITRATE (PF) 100 MCG/2ML IJ SOLN: 25.0000 ug | INTRAMUSCULAR | Status: DC | PRN

## 2015-04-30 MED ORDER — EPHEDRINE SULFATE 50 MG/ML IJ SOLN
INTRAMUSCULAR | Status: DC | PRN
  Administered 2015-04-30 (×2): 5 mg via INTRAVENOUS

## 2015-04-30 MED ORDER — ONDANSETRON HCL 4 MG/2ML IJ SOLN
INTRAMUSCULAR | Status: DC | PRN
  Administered 2015-04-30: 4 mg via INTRAVENOUS

## 2015-04-30 MED ORDER — CEFAZOLIN SODIUM-DEXTROSE 2-3 GM-% IV SOLR
INTRAVENOUS | Status: AC
  Filled 2015-04-30: qty 50

## 2015-04-30 MED ORDER — FENTANYL CITRATE (PF) 100 MCG/2ML IJ SOLN
INTRAMUSCULAR | Status: AC
  Filled 2015-04-30: qty 2

## 2015-04-30 MED ORDER — CEFAZOLIN SODIUM-DEXTROSE 2-3 GM-% IV SOLR
INTRAVENOUS | Status: DC | PRN
  Administered 2015-04-30: 2 g via INTRAVENOUS

## 2015-04-30 MED ORDER — OXYCODONE HCL 5 MG PO TABS: 5.0000 mg | ORAL_TABLET | Freq: Once | ORAL | Status: DC | PRN

## 2015-04-30 MED ORDER — LIDOCAINE HCL (CARDIAC) 20 MG/ML IV SOLN
INTRAVENOUS | Status: DC | PRN
  Administered 2015-04-30: 70 mg via INTRAVENOUS

## 2015-04-30 MED ORDER — PROPOFOL 10 MG/ML IV BOLUS
INTRAVENOUS | Status: DC | PRN
  Administered 2015-04-30: 180 mg via INTRAVENOUS
  Administered 2015-04-30: 20 mg via INTRAVENOUS

## 2015-04-30 MED ORDER — GLYCOPYRROLATE 0.2 MG/ML IJ SOLN: 0.2000 mg | Freq: Once | INTRAMUSCULAR | Status: DC | PRN

## 2015-04-30 MED ORDER — BUPIVACAINE HCL (PF) 0.25 % IJ SOLN
INTRAMUSCULAR | Status: AC
  Filled 2015-04-30: qty 30

## 2015-04-30 MED ORDER — ACETAMINOPHEN 10 MG/ML IV SOLN
1000.0000 mg | Freq: Once | INTRAVENOUS | Status: AC
  Administered 2015-04-30: 1000 mg via INTRAVENOUS

## 2015-04-30 MED ORDER — CHLORHEXIDINE GLUCONATE 4 % EX LIQD: 1.0000 "application " | Freq: Once | CUTANEOUS | Status: DC

## 2015-04-30 MED ORDER — SODIUM CHLORIDE 0.9 % IR SOLN
Status: DC | PRN
  Administered 2015-04-30: 1000 mL

## 2015-04-30 MED ORDER — BUPIVACAINE-EPINEPHRINE (PF) 0.25% -1:200000 IJ SOLN
INTRAMUSCULAR | Status: DC | PRN
  Administered 2015-04-30: 10 mL

## 2015-04-30 MED ORDER — BUPIVACAINE-EPINEPHRINE (PF) 0.25% -1:200000 IJ SOLN
INTRAMUSCULAR | Status: AC
  Filled 2015-04-30: qty 30

## 2015-04-30 MED ORDER — TECHNETIUM TC 99M SULFUR COLLOID FILTERED
1.0000 | Freq: Once | INTRAVENOUS | Status: AC | PRN
Start: 2015-04-30 — End: 2015-04-30
  Administered 2015-04-30: 1 via INTRADERMAL

## 2015-04-30 MED ORDER — SODIUM CHLORIDE 0.9 % IJ SOLN
INTRAMUSCULAR | Status: AC
  Filled 2015-04-30: qty 10

## 2015-04-30 MED ORDER — MIDAZOLAM HCL 2 MG/2ML IJ SOLN
1.0000 mg | INTRAMUSCULAR | Status: DC | PRN
  Administered 2015-04-30: 2 mg via INTRAVENOUS

## 2015-04-30 MED ORDER — LACTATED RINGERS IV SOLN
INTRAVENOUS | Status: DC
  Administered 2015-04-30 (×2): via INTRAVENOUS

## 2015-04-30 SURGICAL SUPPLY — 45 items
APPLIER CLIP 9.375 MED OPEN (MISCELLANEOUS) ×2
BINDER BREAST LRG (GAUZE/BANDAGES/DRESSINGS) IMPLANT
BINDER BREAST MEDIUM (GAUZE/BANDAGES/DRESSINGS) IMPLANT
BINDER BREAST XLRG (GAUZE/BANDAGES/DRESSINGS) IMPLANT
BINDER BREAST XXLRG (GAUZE/BANDAGES/DRESSINGS) IMPLANT
BLADE SURG 15 STRL LF DISP TIS (BLADE) ×1 IMPLANT
BLADE SURG 15 STRL SS (BLADE) ×1
CANISTER SUCT 1200ML W/VALVE (MISCELLANEOUS) ×2 IMPLANT
CHLORAPREP W/TINT 26ML (MISCELLANEOUS) ×2 IMPLANT
CLIP APPLIE 9.375 MED OPEN (MISCELLANEOUS) ×1 IMPLANT
COVER BACK TABLE 60X90IN (DRAPES) ×2 IMPLANT
COVER MAYO STAND STRL (DRAPES) ×2 IMPLANT
COVER PROBE W GEL 5X96 (DRAPES) ×2 IMPLANT
DRAPE LAPAROSCOPIC ABDOMINAL (DRAPES) ×2 IMPLANT
DRAPE UTILITY XL STRL (DRAPES) ×2 IMPLANT
ELECT COATED BLADE 2.86 ST (ELECTRODE) ×2 IMPLANT
ELECT REM PT RETURN 9FT ADLT (ELECTROSURGICAL) ×2
ELECTRODE REM PT RTRN 9FT ADLT (ELECTROSURGICAL) ×1 IMPLANT
GLOVE BIO SURGEON STRL SZ 6.5 (GLOVE) ×2 IMPLANT
GLOVE BIOGEL PI IND STRL 6.5 (GLOVE) ×2 IMPLANT
GLOVE BIOGEL PI IND STRL 8 (GLOVE) ×1 IMPLANT
GLOVE BIOGEL PI INDICATOR 6.5 (GLOVE) ×2
GLOVE BIOGEL PI INDICATOR 8 (GLOVE) ×1
GLOVE ECLIPSE 8.0 STRL XLNG CF (GLOVE) ×2 IMPLANT
GLOVE SURG SS PI 6.5 STRL IVOR (GLOVE) ×2 IMPLANT
GOWN STRL REUS W/ TWL LRG LVL3 (GOWN DISPOSABLE) ×4 IMPLANT
GOWN STRL REUS W/TWL LRG LVL3 (GOWN DISPOSABLE) ×4
HEMOSTAT SNOW SURGICEL 2X4 (HEMOSTASIS) ×2 IMPLANT
KIT MARKER MARGIN INK (KITS) ×2 IMPLANT
LIQUID BAND (GAUZE/BANDAGES/DRESSINGS) ×4 IMPLANT
NDL SAFETY ECLIPSE 18X1.5 (NEEDLE) IMPLANT
NEEDLE HYPO 18GX1.5 SHARP (NEEDLE)
NEEDLE HYPO 25X1 1.5 SAFETY (NEEDLE) ×2 IMPLANT
NS IRRIG 1000ML POUR BTL (IV SOLUTION) ×2 IMPLANT
PACK BASIN DAY SURGERY FS (CUSTOM PROCEDURE TRAY) ×2 IMPLANT
PENCIL BUTTON HOLSTER BLD 10FT (ELECTRODE) ×2 IMPLANT
SLEEVE SCD COMPRESS KNEE MED (MISCELLANEOUS) ×2 IMPLANT
SPONGE LAP 4X18 X RAY DECT (DISPOSABLE) ×2 IMPLANT
SUT MNCRL AB 4-0 PS2 18 (SUTURE) ×4 IMPLANT
SUT VICRYL 3-0 CR8 SH (SUTURE) ×2 IMPLANT
SYR CONTROL 10ML LL (SYRINGE) ×2 IMPLANT
TOWEL OR 17X24 6PK STRL BLUE (TOWEL DISPOSABLE) ×4 IMPLANT
TOWEL OR NON WOVEN STRL DISP B (DISPOSABLE) ×2 IMPLANT
TUBE CONNECTING 20X1/4 (TUBING) ×2 IMPLANT
YANKAUER SUCT BULB TIP NO VENT (SUCTIONS) ×2 IMPLANT

## 2015-04-30 NOTE — Anesthesia Postprocedure Evaluation (Signed)
  Anesthesia Post-op Note  Patient: Kara Meadows  Procedure(s) Performed: Procedure(s): RADIOACTIVE SEED GUIDED RIGHT LUMPECTOMY WITH RIGHT  AXILLARY SENTINEL LYMPH NODE BIOPSY (Right)  Patient Location: PACU  Anesthesia Type:General and Regional  Level of Consciousness: awake  Airway and Oxygen Therapy: Patient Spontanous Breathing  Post-op Pain: none  Post-op Assessment: Post-op Vital signs reviewed, Patient's Cardiovascular Status Stable, Respiratory Function Stable, Patent Airway, No signs of Nausea or vomiting and Pain level controlled  Post-op Vital Signs: Reviewed and stable  Last Vitals:  Filed Vitals:   04/30/15 1400  BP: 119/72  Pulse: 82  Temp: 36.7 C  Resp: 14    Complications: No apparent anesthesia complications

## 2015-04-30 NOTE — H&P (View-Only) (Signed)
Kara Meadows 04/15/2015 7:50 AM Location: [facility] Patient #: [id number] DOB: March 30, 1981 Undefined / Language: Kara Meadows / Race: Undefined Female History of Present Illness Kara Moores A. Boyce Keltner MD; 04/15/2015 3:11 PM) Patient words: Diagnosis Breast, right, needle core biopsy, 4 o'clock - INVASIVE DUCTAL CARCINOMA. - DUCTAL CARCINOMA IN SITU. - SEE COMMENT. Microscopic Comment Immunohistochemical stains are performed for calponin, smooth muscle myosin and p63. The stains demonstrate a myoepithelial layer in some of the tumor nests while that layer is absent in other tumor nests. The morphology coupled with the staining pattern is consistent with the above diagnosis. Although definitive grading of breast carcinoma is best done on excision, the features of the invasive tumor from the right 4 o'clock biopsy are compatible with a grade II breast carcinoma. Breast prognostic markers will be performed and reported in an addendum. Findings are called to the [name] on 04/08/15. Dr. Saralyn Meadows has seen this case in consultation with agreement. (RAH:gt, 04/08/15) Kara Niece MD Pathologist, Electronic Signature (Case signed 04                  CLINICAL DATA: Patient with recent diagnosis right breast invasive ductal carcinoma.  EXAM: BILATERAL BREAST MRI WITH AND WITHOUT CONTRAST  TECHNIQUE: Multiplanar, multisequence MR images of both breasts were obtained prior to and following the intravenous administration of 15 ml of MultiHance.  THREE-DIMENSIONAL MR IMAGE RENDERING ON INDEPENDENT WORKSTATION:  Three-dimensional MR images were rendered by post-processing of the original MR data on an independent workstation. The three-dimensional MR images were interpreted, and findings are reported in the following complete MRI report for this study. Three dimensional images were evaluated at the independent DynaCad workstation  COMPARISON:  Previous exam(s).  FINDINGS: Breast composition: c. Heterogeneous fibroglandular tissue.  Background parenchymal enhancement: Moderate  Right breast: Within the lower inner right breast middle depth there is a 1.7 x 1.7 x 0.9 cm enhancing mass containing biopsy marking clip, compatible with recently biopsy proven right breast carcinoma. No additional sites of suspicious enhancement identified within the right breast.  Left breast: No mass or abnormal enhancement.  Lymph nodes: No abnormal appearing lymph nodes.  Ancillary findings: None.  IMPRESSION: Right breast mass compatible with biopsy-proven invasive ductal carcinoma. No additional sites of suspicious enhancement identified within either breast.  RECOMMENDATION: Treatment plan  BI-RADS CATEGORY 6: Known biopsy-proven malignancy.   Electronically Signed By: Kara Meadows M.Kara. On: 04/10/2015 15:40  PT SEEN AT THE REQUEST OF Kara Meadows FOR RIGHT BREAST CANCER.  The patient is a 34 year old female who presents with breast cancer. The patient is being seen for a consultation for Stage I of the right breast. Tumor markers include estrogen receptor positive, progesterone receptor negative and HER-2/neu negative. Initial presentation was 1 month(s) ago for breast mass on self-examination and an abnormal mammogram. Other Problems Kara Meadows, Meadows; 04/15/2015 7:50 AM) Anxiety Disorder Breast Cancer Depression Lump In Breast Migraine Headache  Past Surgical History Kara Meadows, Meadows; 04/15/2015 7:50 AM) Breast Biopsy Right. Oral Surgery  Diagnostic Studies History Kara Meadows, [place]; 04/15/2015 7:50 AM) Colonoscopy never Pap Smear 1-5 years ago  Social History Kara Meadows, [place]; 04/15/2015 7:50 AM) Alcohol use Occasional alcohol use. Caffeine use Carbonated beverages, Coffee. Tobacco use Never smoker.  Family History Kara Meadows, [place]; 04/15/2015 7:50 AM) Alcohol Abuse Family Members In  General, Father, Mother. Arthritis Mother. Breast Cancer Family Members In General. Cerebrovascular Accident Family Members In General. Heart Disease Family Members In General. Hypertension Brother, Family  Members In General, Father, Mother. Melanoma Father.  Pregnancy / Birth History Kara Meadows, [place]; 04/15/2015 7:50 AM) Age at menarche 39 years. Contraceptive History Intrauterine device, Oral contraceptives. Gravida 0 Irregular periods Para 0     Review of Systems Kara Meadows; 04/15/2015 7:50 AM) General Present- Fatigue. Not Present- Appetite Loss, Chills, Fever, Night Sweats, Weight Gain and Weight Loss. Skin Not Present- Change in Wart/Mole, Dryness, Hives, Jaundice, New Lesions, Non-Healing Wounds, Rash and Ulcer. HEENT Not Present- Earache, Hearing Loss, Hoarseness, Nose Bleed, Oral Ulcers, Ringing in the Ears, Seasonal Allergies, Sinus Pain, Sore Throat, Visual Disturbances, Wears glasses/contact lenses and Yellow Eyes. Respiratory Present- Snoring. Not Present- Bloody sputum, Chronic Cough, Difficulty Breathing and Wheezing. Breast Present- Breast Mass. Not Present- Breast Pain, Nipple Discharge and Skin Changes. Cardiovascular Not Present- Chest Pain, Difficulty Breathing Lying Down, Leg Cramps, Palpitations, Rapid Heart Rate, Shortness of Breath and Swelling of Extremities. Gastrointestinal Not Present- Abdominal Pain, Bloating, Bloody Stool, Change in Bowel Habits, Chronic diarrhea, Constipation, Difficulty Swallowing, Excessive gas, Gets full quickly at meals, Hemorrhoids, Indigestion, Nausea, Rectal Pain and Vomiting. Female Genitourinary Not Present- Frequency, Nocturia, Painful Urination, Pelvic Pain and Urgency. Musculoskeletal Not Present- Back Pain, Joint Pain, Joint Stiffness, Muscle Pain, Muscle Weakness and Swelling of Extremities. Psychiatric Present- Anxiety, Change in Sleep Pattern and Fearful. Not Present- Bipolar, Depression and Frequent  crying.   Physical Exam (Kara Poyer A. Brennyn Ortlieb MD; 04/15/2015 3:11 PM)  General Mental Status-Alert. General Appearance-Consistent with stated age. Hydration-Well hydrated. Voice-Normal.  Head and Neck Head-normocephalic, atraumatic with no lesions or palpable masses. Trachea-midline. Thyroid Gland Characteristics - normal size and consistency.  Chest and Lung Exam Chest and lung exam reveals -quiet, even and easy respiratory effort with no use of accessory muscles and on auscultation, normal breath sounds, no adventitious sounds and normal vocal resonance. Inspection Chest Wall - Normal. Back - normal.  Breast Note: RIGHT BREAST 4 [place] 1 CM MOBILE MASS LEFT BREAST NORMAL   Cardiovascular Cardiovascular examination reveals -normal heart sounds, regular rate and rhythm with no murmurs and normal pedal pulses bilaterally.  Neurologic Neurologic evaluation reveals -alert and oriented x 3 with no impairment of recent or remote memory. Mental Status-Normal.  Lymphatic Axillary  General Axillary Region: Bilateral - Description - Normal. Tenderness - Non Tender.    Assessment & Plan (Fern Asmar A. Emiline Mancebo MD; 04/15/2015 3:12 PM)  BREAST CANCER, STAGE 1, RIGHT (174.9  C50.911) Impression: PT DESIRES RIGHT BREAST LUMPECTOMY AND SLN mapping Discussed mastectomy and reconstruction and genetics. Risk of lumpectomy include bleeding, infection, seroma, more surgery, use of seed/wire, wound care, cosmetic deformity and the need for other treatments, death , blood clots, death. Pt agrees to proceed. Risk of sentinel lymph node mapping include bleeding, infection, lymphedema, shoulder pain. stiffness, dye allergy. cosmetic deformity , blood clots, death, need for more surgery. Pt agres to proceed.

## 2015-04-30 NOTE — Progress Notes (Signed)
Radiology staff performed nuc med inj. No additional sedation required. Pt tol well. Will call family back for visit prior to OR

## 2015-04-30 NOTE — Progress Notes (Signed)
Assisted Dr. Moser with right, ultrasound guided, pectoralis block. Side rails up, monitors on throughout procedure. See vital signs in flow sheet. Tolerated Procedure well. 

## 2015-04-30 NOTE — Transfer of Care (Signed)
Immediate Anesthesia Transfer of Care Note  Patient: Kara Meadows  Procedure(s) Performed: Procedure(s): RADIOACTIVE SEED GUIDED RIGHT LUMPECTOMY WITH RIGHT  AXILLARY SENTINEL LYMPH NODE BIOPSY (Right)  Patient Location: PACU  Anesthesia Type:GA combined with regional for post-op pain  Level of Consciousness: awake, alert , oriented and patient cooperative  Airway & Oxygen Therapy: Patient Spontanous Breathing and Patient connected to face mask oxygen  Post-op Assessment: Report given to RN, Post -op Vital signs reviewed and stable and Patient moving all extremities  Post vital signs: Reviewed and stable  Last Vitals:  Filed Vitals:   04/30/15 1135  BP: 127/67  Pulse: 81  Temp:   Resp: 19    Complications: No apparent anesthesia complications

## 2015-04-30 NOTE — Op Note (Signed)
Preoperative diagnosis: Right breast cancer stage I  Postoperative diagnosis: Same  Procedure: Right breast seed localized partial mastectomy with sentinel lymph node mapping.  Surgeon: Erroll Luna M.D.  Anesthesia: LMA with regional block and 0.25% Sensorcaine local with epinephrine  EBL: Minimal  Specimens: #1 right breast mass with clip and chip verified by radiography #2 right sentinel lymph nodes 2  Drains: None  IV fluids: 600 mL crystalloid  Indications for procedure: Patient presents with stage I right breast cancer. Was seen in the multidisciplinary breast cancer clinic where surgical options were reviewed. She desired breast conservation. I discussed partial mastectomy and sentinel lymph node mapping with the patient. Risk of bleeding, infection, cosmetic deformity, the need for further surgery, blood vessel injury, nerve injury, shoulder stiffness, arm swelling, arm weakness, and the need for other procedures discussed. Alternatives to surgery discussed. Other treatments of chemotherapy and radiation therapy discussed with the help of medical and radiation oncology. She agreed to proceed.  Description of procedure: Patient met in holding area and questions answered. Neoprobe used to localize seen in right breast. Patient underwent regional block by anesthesia. Patient underwent nuclear medicine injection. She was in taken back to the operating room placed upon the OR table. After induction of LMA anesthesia, right breast was prepped and draped in a sterile fashion. Timeout was done to verify proper patient and side. Neoprobe used to localize seed which was in the inferior central right breast. Curvilinear incision was made along the inferior border of the nipple complex. Skin flap was raised inferiorly. All tissue around the seed was excised the grossly negative margin. The specimen was oriented and images revealed both the clip and the chip to be present in the center of the  specimen. Cavity is found hemostatic. I mobilized the nipple all the breast parenchyma advance the breast parenchyma to fill in the defect in the inferior pole the right breast. This was secured with 3-0 Vicryl. 3-0 Vicryl was used to secure the skin to the underlying parenchyma. 4-0 Monocryl was used to close the skin a subcuticular fashion.  Neoprobe settings were changed. Hotspot identified in the right axilla. Curvilinear incision made along the inferior hairline dissection was carried down to the level I contents of the right exam. Neoprobe identify 2 hot sentinel nodes and these were removed. Background counts approached 0. Hemostasis achieved with cautery moving closed with 3-0 Vicryl and 4-0 Monocryl. Liquid adhesive applied. All final counts found to be correct. Patient awoke extubated taken to recovery in satisfactory condition.

## 2015-04-30 NOTE — Interval H&P Note (Signed)
History and Physical Interval Note:  04/30/2015 11:27 AM  Kara Meadows  has presented today for surgery, with the diagnosis of right breast cancer  The various methods of treatment have been discussed with the patient and family. After consideration of risks, benefits and other options for treatment, the patient has consented to  Procedure(s): RADIOACTIVE SEED GUIDED RIGHT PARTIAL MASTECTOMY WITH RIGHT  AXILLARY SENTINEL LYMPH NODE BIOPSY (Right) as a surgical intervention .  The patient's history has been reviewed, patient examined, no change in status, stable for surgery.  I have reviewed the patient's chart and labs.  Questions were answered to the patient's satisfaction.     Keleigh Kazee A.

## 2015-04-30 NOTE — Anesthesia Procedure Notes (Addendum)
Anesthesia Regional Block:  Pectoralis block  Pre-Anesthetic Checklist: ,, timeout performed, Correct Patient, Correct Site, Correct Laterality, Correct Procedure, Correct Position, site marked, Risks and benefits discussed,  Surgical consent,  Pre-op evaluation,  At surgeon's request and post-op pain management  Laterality: N/A and Right  Prep: chloraprep       Needles:  Injection technique: Single-shot  Needle Type: Echogenic Stimulator Needle          Additional Needles:  Procedures: ultrasound guided (picture in chart) Pectoralis block Narrative:  Injection made incrementally with aspirations every 5 mL.  Performed by: Personally  Anesthesiologist: Jamyiah Labella, CHRIS  Additional Notes: H+P and labs reviewed, risks and benefits discussed with patient, procedure tolerated well without complications   Procedure Name: LMA Insertion Performed by: Tawni Millers Pre-anesthesia Checklist: Patient identified, Emergency Drugs available, Suction available and Patient being monitored Patient Re-evaluated:Patient Re-evaluated prior to inductionOxygen Delivery Method: Circle System Utilized Preoxygenation: Pre-oxygenation with 100% oxygen Intubation Type: IV induction Ventilation: Mask ventilation without difficulty LMA: LMA inserted LMA Size: 4.0 Number of attempts: 1 Airway Equipment and Method: bite block Placement Confirmation: positive ETCO2 Tube secured with: Tape Dental Injury: Teeth and Oropharynx as per pre-operative assessment

## 2015-04-30 NOTE — Discharge Instructions (Signed)
[facility] °Office Phone Number [phone] ° °BREAST BIOPSY/ PARTIAL MASTECTOMY: POST OP INSTRUCTIONS ° °Always review your discharge instruction sheet given to you by the facility where your surgery was performed. ° °IF YOU HAVE DISABILITY OR FAMILY LEAVE FORMS, YOU MUST BRING THEM TO THE OFFICE FOR PROCESSING.  DO NOT GIVE THEM TO YOUR DOCTOR. ° °1. A prescription for pain medication may be given to you upon discharge.  Take your pain medication as prescribed, if needed.  If narcotic pain medicine is not needed, then you may take acetaminophen (Tylenol) or ibuprofen (Advil) as needed. °2. Take your usually prescribed medications unless otherwise directed °3. If you need a refill on your pain medication, please contact your pharmacy.  They will contact our office to request authorization.  Prescriptions will not be filled after 5pm or on week-ends. °4. You should eat very light the first 24 hours after surgery, such as soup, crackers, pudding, etc.  Resume your normal diet the day after surgery. °5. Most patients will experience some swelling and bruising in the breast.  Ice packs and a good support bra will help.  Swelling and bruising can take several days to resolve.  °6. It is common to experience some constipation if taking pain medication after surgery.  Increasing fluid intake and taking a stool softener will usually help or prevent this problem from occurring.  A mild laxative (Milk of Magnesia or Miralax) should be taken according to package directions if there are no bowel movements after 48 hours. °7. Unless discharge instructions indicate otherwise, you may remove your bandages 24-48 hours after surgery, and you may shower at that time.  You may have steri-strips (small skin tapes) in place directly over the incision.  These strips should be left on the skin for 7-10 days.  If your surgeon used skin glue on the incision, you may shower in 24 hours.  The glue will flake off over the  next 2-3 weeks.  Any sutures or staples will be removed at the office during your follow-up visit. °8. ACTIVITIES:  You may resume regular daily activities (gradually increasing) beginning the next day.  Wearing a good support bra or sports bra minimizes pain and swelling.  You may have sexual intercourse when it is comfortable. °a. You may drive when you no longer are taking prescription pain medication, you can comfortably wear a seatbelt, and you can safely maneuver your car and apply brakes. °b. RETURN TO WORK:  ______________________________________________________________________________________ °9. You should see your doctor in the office for a follow-up appointment approximately two weeks after your surgery.  Your doctor’s nurse will typically make your follow-up appointment when she calls you with your pathology report.  Expect your pathology report 2-3 business days after your surgery.  You may call to check if you do not hear from us after three days. °10. OTHER INSTRUCTIONS: _______________________________________________________________________________________________ _____________________________________________________________________________________________________________________________________ °_____________________________________________________________________________________________________________________________________ °_____________________________________________________________________________________________________________________________________ ° °WHEN TO CALL YOUR DOCTOR: °1. Fever over 101.0 °2. Nausea and/or vomiting. °3. Extreme swelling or bruising. °4. Continued bleeding from incision. °5. Increased pain, redness, or drainage from the incision. ° °The clinic staff is available to answer your questions during regular business hours.  Please don’t hesitate to call and ask to speak to one of the nurses for clinical concerns.  If you have a medical emergency, go to the nearest  emergency room or call 911.  A surgeon from [facility] is always on call at the hospital. ° °For further questions, please visit [email or web address]  ° ° ° °  Post Anesthesia Home Care Instructions ° °Activity: °Get plenty of rest for the remainder of the day. A responsible adult should stay with you for 24 hours following the procedure.  °For the next 24 hours, DO NOT: °-Drive a car °-Operate machinery °-Drink alcoholic beverages °-Take any medication unless instructed by your physician °-Make any legal decisions or sign important papers. ° °Meals: °Start with liquid foods such as gelatin or soup. Progress to regular foods as tolerated. Avoid greasy, spicy, heavy foods. If nausea and/or vomiting occur, drink only clear liquids until the nausea and/or vomiting subsides. Call your physician if vomiting continues. ° °Special Instructions/Symptoms: °Your throat may feel dry or sore from the anesthesia or the breathing tube placed in your throat during surgery. If this causes discomfort, gargle with warm salt water. The discomfort should disappear within 24 hours. ° °If you had a scopolamine patch placed behind your ear for the management of post- operative nausea and/or vomiting: ° °1. The medication in the patch is effective for 72 hours, after which it should be removed.  Wrap patch in a tissue and discard in the trash. Wash hands thoroughly with soap and water. °2. You may remove the patch earlier than 72 hours if you experience unpleasant side effects which may include dry mouth, dizziness or visual disturbances. °3. Avoid touching the patch. Wash your hands with soap and water after contact with the patch. °  ° °

## 2015-04-30 NOTE — Anesthesia Preprocedure Evaluation (Signed)
Anesthesia Evaluation  Patient identified by MRN, date of birth, ID band Patient awake    Reviewed: Allergy & Precautions, NPO status , Patient's Chart, lab work & pertinent test results  History of Anesthesia Complications Negative for: history of anesthetic complications  Airway Mallampati: II  TM Distance: >3 FB Neck ROM: Full    Dental  (+) Teeth Intact   Pulmonary neg pulmonary ROS,  breath sounds clear to auscultation        Cardiovascular negative cardio ROS  Rhythm:Regular     Neuro/Psych  Headaches, PSYCHIATRIC DISORDERS Anxiety Depression    GI/Hepatic negative GI ROS, Neg liver ROS,   Endo/Other  negative endocrine ROS  Renal/GU negative Renal ROS     Musculoskeletal   Abdominal   Peds  Hematology negative hematology ROS (+)   Anesthesia Other Findings Right breast cancer  Reproductive/Obstetrics                             Anesthesia Physical Anesthesia Plan  ASA: II  Anesthesia Plan: General and Regional   Post-op Pain Management:    Induction: Intravenous  Airway Management Planned: LMA  Additional Equipment: None  Intra-op Plan:   Post-operative Plan: Extubation in OR  Informed Consent: I have reviewed the patients History and Physical, chart, labs and discussed the procedure including the risks, benefits and alternatives for the proposed anesthesia with the patient or authorized representative who has indicated his/her understanding and acceptance.   Dental advisory given  Plan Discussed with: CRNA and Surgeon  Anesthesia Plan Comments:         Anesthesia Quick Evaluation

## 2015-05-01 ENCOUNTER — Other Ambulatory Visit: Payer: Self-pay

## 2015-05-01 ENCOUNTER — Encounter (HOSPITAL_BASED_OUTPATIENT_CLINIC_OR_DEPARTMENT_OTHER): Payer: Self-pay | Admitting: Surgery

## 2015-05-01 NOTE — Telephone Encounter (Signed)
Charting error.

## 2015-05-06 ENCOUNTER — Ambulatory Visit: Payer: Self-pay | Admitting: Surgery

## 2015-05-06 NOTE — Assessment & Plan Note (Addendum)
Right breast lumpectomy 04/30/2015: Invasive ductal carcinoma 2.1 cm, positive for lymphovascular invasion, DCIS less than 0.01 millimeters from inferior margin, 2 sentinel nodes negative, grade 3 , ER 99%, PR 100%, HER-2 negative, Ki-67 24%  Recommendation: 1. Oncotype DX are determined chemotherapy benefit 2. Followed by radiation therapy  3. Followed by antiestrogen therapy   Oncotype DX counseling:I discussed Oncotype DX test. I explained to the patient that this is a 21 gene panel to evaluate patient tumors DNA to calculate recurrence score. This would help determine whether patient has high risk or intermediate risk or low risk breast cancer. She understands that if her tumor was found to be high risk, she would benefit from systemic chemotherapy. If low risk, no need of chemotherapy. If she was found to be intermediate risk, we would need to evaluate the score as well as other risk factors and determine if an abbreviated chemotherapy may be of benefit.  Return to clinic in 3 weeks to discuss Oncotype DX test results.

## 2015-05-07 ENCOUNTER — Ambulatory Visit (HOSPITAL_BASED_OUTPATIENT_CLINIC_OR_DEPARTMENT_OTHER): Payer: BC Managed Care – PPO | Admitting: Hematology and Oncology

## 2015-05-07 ENCOUNTER — Encounter: Payer: Self-pay | Admitting: *Deleted

## 2015-05-07 ENCOUNTER — Telehealth: Payer: Self-pay | Admitting: Hematology and Oncology

## 2015-05-07 VITALS — BP 119/76 | HR 78 | Temp 99.2°F | Resp 18 | Ht 65.0 in | Wt 176.8 lb

## 2015-05-07 DIAGNOSIS — Z170 Estrogen receptor positive status [ER+]: Secondary | ICD-10-CM | POA: Diagnosis not present

## 2015-05-07 DIAGNOSIS — C50311 Malignant neoplasm of lower-inner quadrant of right female breast: Secondary | ICD-10-CM | POA: Diagnosis not present

## 2015-05-07 NOTE — Telephone Encounter (Signed)
Appointments made and avs printed for patient °

## 2015-05-07 NOTE — Progress Notes (Signed)
Patient Care Team: Carlos Levering, PA-C as PCP - General (Family Medicine) Erroll Luna, MD as Consulting Physician (General Surgery) Nicholas Lose, MD as Consulting Physician (Hematology and Oncology) Gery Pray, MD as Consulting Physician (Radiation Oncology) Rockwell Germany, RN as Registered Nurse Mauro Kaufmann, RN as Registered Nurse Holley Bouche, NP as Nurse Practitioner (Nurse Practitioner)  DIAGNOSIS: Breast cancer of lower-inner quadrant of right female breast   Staging form: Breast, AJCC 7th Edition     Clinical stage from 04/15/2015: Stage IA (T1c, N0, M0) - Unsigned     Pathologic stage from 05/05/2015: Stage IIA (T2, N0, cM0) - Unsigned       Staging comments: Staged on final lumpectomy specimen by Dr. 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, ER 100%, PR 100%, Ki-67 30%, HER-2 negative   04/10/2015 Breast MRI Right breast mass 1.7 x 1.7 x 0.9 cm, lymph nodes negative   04/30/2015 Surgery  Right breast lumpectomy: Invasive ductal carcinoma 2.1 cm, positive for lymphovascular invasion, DCIS less than 0.01 millimeters from inferior margin, 2 sentinel nodes negative, grade 3 , ER 99%, PR 100%, HER-2 negative, Ki-67 24%    CHIEF COMPLIANT: patient is here to discuss final pathology report  INTERVAL HISTORY: Kara Meadows is a 34 year old above-mentioned history of right-sided breast cancer underwent lumpectomy and is here today to discuss the pathology report. She is healing very well for the procedure except for pain underneath the axilla. She has been informed that she would need resection for margins.  REVIEW OF SYSTEMS:   Constitutional: Denies fevers, chills or abnormal weight loss Eyes: Denies blurriness of vision Ears, nose, mouth, throat, and face: Denies mucositis or sore throat Respiratory: Denies  cough, dyspnea or wheezes Cardiovascular: Denies palpitation, chest discomfort or lower extremity swelling Gastrointestinal:  Denies nausea, heartburn or change in bowel habits Skin: Denies abnormal skin rashes Lymphatics: Denies new lymphadenopathy or easy bruising Neurological:Denies numbness, tingling or new weaknesses Behavioral/Psych: Mood is stable, no new changes  Breast: soreness under the axilla All other systems were reviewed with the patient and are negative.  I have reviewed the past medical history, past surgical history, social history and family history with the patient and they are unchanged from previous note.  ALLERGIES:  is allergic to amoxicillin.  MEDICATIONS:  Current Outpatient Prescriptions  Medication Sig Dispense Refill  . aspirin-acetaminophen-caffeine (EXCEDRIN MIGRAINE) 250-250-65 MG per tablet Take 1 tablet by mouth every 6 (six) hours as needed for migraine.    Marland Kitchen FLUoxetine (PROZAC) 10 MG tablet     . Levonorgestrel 13.5 MG IUD by Intrauterine route.    . Multiple Vitamins-Minerals (MULTIVITAMIN WITH MINERALS) tablet Take 1 tablet by mouth daily.    Marland Kitchen oxyCODONE-acetaminophen (ROXICET) 5-325 MG per tablet Take 1-2 tablets by mouth every 4 (four) hours as needed. 30 tablet 0   No current facility-administered medications for this visit.    PHYSICAL EXAMINATION: ECOG PERFORMANCE STATUS: 1 - Symptomatic but completely ambulatory  Filed Vitals:   05/07/15 1355  BP: 119/76  Pulse: 78  Temp: 99.2 F (37.3 C)  Resp: 18   Filed Weights   05/07/15 1355  Weight: 176 lb 12.8 oz (80.196 kg)    GENERAL:alert, no distress and comfortable SKIN: skin color, texture, turgor are normal, no rashes or significant lesions EYES: normal, Conjunctiva are pink and  non-injected, sclera clear OROPHARYNX:no exudate, no erythema and lips, buccal mucosa, and tongue normal  NECK: supple, thyroid normal size, non-tender, without nodularity LYMPH:  no palpable  lymphadenopathy in the cervical, axillary or inguinal LUNGS: clear to auscultation and percussion with normal breathing effort HEART: regular rate & rhythm and no murmurs and no lower extremity edema ABDOMEN:abdomen soft, non-tender and normal bowel sounds Musculoskeletal:no cyanosis of digits and no clubbing  NEURO: alert & oriented x 3 with fluent speech, no focal motor/sensory deficits  LABORATORY DATA:  I have reviewed the data as listed   Chemistry      Component Value Date/Time   NA 141 04/15/2015 1203   K 4.2 04/15/2015 1203   CO2 24 04/15/2015 1203   BUN 12.9 04/15/2015 1203   CREATININE 0.8 04/15/2015 1203      Component Value Date/Time   CALCIUM 8.9 04/15/2015 1203   ALKPHOS 58 04/15/2015 1203   AST 18 04/15/2015 1203   ALT 16 04/15/2015 1203   BILITOT <0.20 04/15/2015 1203       Lab Results  Component Value Date   WBC 5.4 04/15/2015   HGB 12.8 04/15/2015   HCT 39.8 04/15/2015   MCV 88.9 04/15/2015   PLT 185 04/15/2015   NEUTROABS 2.6 04/15/2015   ASSESSMENT & PLAN:  Breast cancer of lower-inner quadrant of right female breast Right breast lumpectomy 04/30/2015: Invasive ductal carcinoma 2.1 cm, positive for lymphovascular invasion, DCIS less than 0.01 millimeters from inferior margin, 2 sentinel nodes negative, grade 3 , ER 99%, PR 100%, HER-2 negative, Ki-67 24%  Recommendation: 1. Oncotype DX to determine chemotherapy benefit 2. Followed by radiation therapy  3. Followed by antiestrogen therapy   Oncotype DX counseling:I discussed Oncotype DX test. I explained to the patient that this is a 21 gene panel to evaluate patient tumors DNA to calculate recurrence score. This would help determine whether patient has high risk or intermediate risk or low risk breast cancer. She understands that if her tumor was found to be high risk, she would benefit from systemic chemotherapy. If low risk, no need of chemotherapy. If she was found to be intermediate risk, we would  need to evaluate the score as well as other risk factors and determine if an abbreviated chemotherapy may be of benefit.  Return to clinic in 3 weeks to discuss Oncotype DX test results.    No orders of the defined types were placed in this encounter.   The patient has a good understanding of the overall plan. she agrees with it. she will call with any problems that may develop before the next visit here.   Rulon Eisenmenger, MD

## 2015-05-07 NOTE — Progress Notes (Signed)
Ordered oncotype per Dr. Lindi Adie.  Faxed PA to [employer] and [employer].  Faxed requisition to pathology and confirmed with Tammy

## 2015-05-08 ENCOUNTER — Encounter: Payer: Self-pay | Admitting: Hematology and Oncology

## 2015-05-12 NOTE — Progress Notes (Signed)
Late entry-5/12 Met with pt post-op. Relate she is doing well although sore.  Denies needs at this time. Encourage pt to call with questions or concerns.

## 2015-05-18 ENCOUNTER — Ambulatory Visit: Payer: Self-pay | Admitting: Surgery

## 2015-05-20 LAB — COMPREHENSIVE METABOLIC PANEL
ALT: 13 U/L (ref 12–78)
AST: 19 U/L (ref 15–37)
Albumin,Serum: 3.7 g/dL (ref 3.4–5.0)
Alkaline Phosphatase: 91 U/L (ref 45–117)
Anion Gap: 10 NA
BUN: 12 mg/dL (ref 7–25)
CO2: 24 mmol/L (ref 21–32)
Calcium: 8.8 mg/dL (ref 8.2–10.1)
Chloride: 105 mmol/L (ref 98–109)
Creatinine: 0.72 mg/dL (ref 0.55–1.40)
EGFR IF NonAfrican American: >60 mL/min (ref >60)
Glucose: 80 mg/dL (ref 70–100)
Potassium: 3.9 mmol/L (ref 3.5–5.1)
Sodium: 139 mmol/L (ref 135–145)
Total Bilirubin: 0.4 mg/dL (ref 0.2–1.0)
Total Protein: 7.4 g/dL (ref 6.4–8.2)
eGFR African American: >60 mL/min (ref >60)

## 2015-05-20 LAB — CBC WITH AUTO DIFFERENTIAL
Absolute Baso #: 0 10*3/uL (ref 0.0–0.2)
Absolute Eos #: 0.2 10*3/uL (ref 0.0–0.5)
Absolute Lymph #: 2.2 10*3/uL (ref 1.0–4.3)
Absolute Mono #: 0.6 10*3/uL (ref 0.0–0.8)
Absolute Neut #: 8 10*3/uL — ABNORMAL HIGH (ref 1.8–7.0)
Basophils: 0.2 %
Eosinophils: 1.8 %
Granulocytes %: 72.6 %
Hematocrit: 42.5 % (ref 35.0–47.0)
Hemoglobin: 14.2 g/dL (ref 11.7–16.0)
Lymphocyte %: 19.7 %
MCH: 29 pg (ref 26.0–34.0)
MCHC: 33.3 % (ref 32.0–36.0)
MCV: 87 fL (ref 79.0–98.0)
MPV: 8.3 fL (ref 7.4–10.4)
Monocytes: 5.7 %
Platelets: 261 10*3/uL (ref 140–440)
RBC: 4.88 10*6/uL (ref 3.80–5.20)
RDW: 13.7 % (ref 11.5–14.5)
WBC: 11.1 10*3/uL — ABNORMAL HIGH (ref 3.6–10.7)

## 2015-05-21 ENCOUNTER — Encounter: Payer: Self-pay | Admitting: *Deleted

## 2015-05-21 ENCOUNTER — Other Ambulatory Visit: Payer: Self-pay | Admitting: Hematology and Oncology

## 2015-05-21 DIAGNOSIS — C50311 Malignant neoplasm of lower-inner quadrant of right female breast: Secondary | ICD-10-CM

## 2015-05-21 NOTE — Progress Notes (Unsigned)
Received oncotype score of 17/11%.  Will notify team.  Copy to Dr. Lindi Adie and copy to medical records to scan.

## 2015-05-22 ENCOUNTER — Encounter: Payer: Self-pay | Admitting: *Deleted

## 2015-05-22 NOTE — Progress Notes (Signed)
Received Oncotype Dx score of 17.  Placed a copy in Dr. Geralyn Flash box and took one to HIM to scan.

## 2015-05-26 ENCOUNTER — Encounter: Attending: Infectious Disease | Primary: Internal Medicine

## 2015-05-26 NOTE — Progress Notes (Signed)
Location of Breast Cancer: Breast cancer of lower-inner quadrant of right female breast  Histology per Pathology Report:   04/30/15 Diagnosis 1. Breast, lumpectomy, Right - INVASIVE DUCTAL CARCINOMA, SEE COMMENT. - POSITIVE FOR LYMPH VASCULAR INVASION - INVASIVE TUMOR IS 0.01 MM FROM NEAREST INFERIOR MARGIN. - DUCTAL CARCINOMA IN SITU - IN SITU CARCINOMA IS LESS THAN 0.01 MM FROM NEAREST INFERIOR MARGIN - PREVIOUS BIOPSY SITE. 2. Lymph node, sentinel, biopsy, Right axillary - ONE LYMPH NODE, NEGATIVE FOR TUMOR (0/1). 3. Lymph node, sentinel, biopsy, Right axillary - ONE LYMPH NODE, NEGATIVE FOR TUMOR (0/1).  04/06/15 Diagnosis Breast, right, needle core biopsy, 4 o'clock - INVASIVE DUCTAL CARCINOMA. - DUCTAL CARCINOMA IN SITU. - SEE COMMENT.  Receptor Status: ER(99%), PR (100%), Her2-neu (negative)  Did patient present with symptoms (if so, please note symptoms) or was this found on screening mammography?: Earlier this year on self-examination the patient palpated a lesion within the lower inner quadrant of the right breast.   Past/Anticipated interventions by surgeon, if any: 04/30/15 -Procedure: RADIOACTIVE SEED GUIDED RIGHT LUMPECTOMY WITH RIGHT  AXILLARY SENTINEL LYMPH NODE BIOPSY;  Surgeon: Erroll Luna, MD;  Location: [facility];  Service: General;  Laterality: Right;    Past/Anticipated interventions by medical oncology, if any: Per Dr. Lindi Adie - Oncotype DX to determine chemotherapy benefit. Followed by radiation therapy. Followed by antiestrogen therapy .  Lymphedema issues, if any:  no  Pain issues, if any:  NIPPLES SENSITIVE,   SAFETY ISSUES:  Prior radiation?NO  Pacemaker/ICD? {NO  Possible current pregnancy?NO  Is the patient on methotrexate? no  Current Complaints / other details: Married, no children, GxPO, father melanoma, maternal aunt and maternal grandmother both breast cancer ALLERGIES: AMOXICILLIN=RASH AS A CHILD    Hess, Craige Cotta,  RN 05/26/2015,11:10 AM

## 2015-05-27 ENCOUNTER — Telehealth: Payer: Self-pay | Admitting: *Deleted

## 2015-05-27 NOTE — Assessment & Plan Note (Signed)
Right breast lumpectomy 04/30/2015: Invasive ductal carcinoma 2.1 cm, positive for lymphovascular invasion, DCIS less than 0.01 millimeters from inferior margin, 2 sentinel nodes negative, grade 3 , ER 99%, PR 100%, HER-2 negative, Ki-67 24%  Recommendation: 1. Oncotype DX Low risk: discussed the result in detail and provided a copy to pt 2. Adj radiation therapy  3. Followed by antiestrogen therapy   RTC after XRT to start anti-estrogen therapy

## 2015-05-27 NOTE — Telephone Encounter (Signed)
Pt request to meet with counselor. Sent referral to [facility] for pt to be assessed for emotional support and needs.

## 2015-05-28 ENCOUNTER — Other Ambulatory Visit: Payer: Self-pay | Admitting: *Deleted

## 2015-05-28 ENCOUNTER — Telehealth: Payer: Self-pay | Admitting: Hematology and Oncology

## 2015-05-28 ENCOUNTER — Encounter: Payer: Self-pay | Admitting: Radiation Oncology

## 2015-05-28 ENCOUNTER — Telehealth: Payer: Self-pay | Admitting: *Deleted

## 2015-05-28 ENCOUNTER — Ambulatory Visit
Admission: RE | Admit: 2015-05-28 | Discharge: 2015-05-28 | Disposition: A | Discharge status: Home / Self Care | Payer: BC Managed Care – PPO | Source: Ambulatory Visit | Attending: Radiation Oncology | Admitting: Radiation Oncology

## 2015-05-28 ENCOUNTER — Encounter: Payer: Self-pay | Admitting: *Deleted

## 2015-05-28 ENCOUNTER — Ambulatory Visit (HOSPITAL_BASED_OUTPATIENT_CLINIC_OR_DEPARTMENT_OTHER): Payer: BC Managed Care – PPO | Admitting: Hematology and Oncology

## 2015-05-28 VITALS — BP 121/69 | HR 91 | Temp 98.4°F | Resp 18 | Ht 65.0 in | Wt 177.9 lb

## 2015-05-28 VITALS — BP 121/69 | HR 91 | Temp 98.4°F | Resp 18 | Ht 65.0 in | Wt 177.6 lb

## 2015-05-28 DIAGNOSIS — C50319 Malignant neoplasm of lower-inner quadrant of unspecified female breast: Secondary | ICD-10-CM | POA: Insufficient documentation

## 2015-05-28 DIAGNOSIS — Z170 Estrogen receptor positive status [ER+]: Secondary | ICD-10-CM

## 2015-05-28 DIAGNOSIS — C50311 Malignant neoplasm of lower-inner quadrant of right female breast: Secondary | ICD-10-CM

## 2015-05-28 DIAGNOSIS — Z510 Encounter for antineoplastic radiation therapy: Secondary | ICD-10-CM | POA: Insufficient documentation

## 2015-05-28 MED ORDER — TAMOXIFEN CITRATE 20 MG PO TABS: 20.0000 mg | ORAL_TABLET | Freq: Every day | ORAL | Status: DC

## 2015-05-28 MED ORDER — TAMOXIFEN CITRATE 20 MG PO TABS
20.0000 mg | ORAL_TABLET | Freq: Every day | ORAL | Status: DC
Start: 2015-05-28 — End: 2016-09-01

## 2015-05-28 MED ORDER — SERTRALINE HCL 100 MG PO TABS
100 MG | ORAL_TABLET | ORAL | Status: DC
Start: 2015-05-28 — End: 2016-05-25

## 2015-05-28 NOTE — Progress Notes (Signed)
Radiation Oncology         [phone] ________________________________  Re- evaluation note  Name: Kara Meadows MRN: [id number]  Date: 05/28/2015  DOB: Jun 09, 1981  FX:OVANVBT,YOMAYOKH, PA-C  Nicholas Lose, MD   REFERRING PHYSICIAN: Nicholas Lose, MD  DIAGNOSIS: Kara Meadows is a 34 year old female presenting to clinic with breast cancer of lower-inner quadrant of right female breast.  HISTORY OF PRESENT ILLNESS:Kara Meadows is a 34 year old female who was initially seen in the multidisciplinary breast clinic on 04/15/2015. Since that time the patient proceeded to undergo her breast conserving surgery on the 04/30/2015. The patient was found to have a 2.1 cm invasive ductal carcinoma within the right breast .  Patient had 2 sentinel lymph nodes removed none of which showed metastatic disease. The surgical margins were close on both the invasive and noninvasive component (less than 0.01 cm). The patient is therefore scheduled for re-excision week. This tumor did show estrogenic and progesterone receptor positive at 99 and 100% respectively. Ki67  was 24%. Oncotype DX testing showed low risk for recurrence and therefore adjuvant chemotherapy is not recommended. The patient will proceed with hormonal therapy after she has completed breast radiation treatments.  Patient has minimal discomfort in her right breast. She denies any problems with swelling in her right arm or hand. She is scheduled to proceed with a 10 mile run in the near future in the [place] area.  PREVIOUS RADIATION THERAPY: No  PAST MEDICAL HISTORY:  has a past medical history of Breast cancer of lower-outer quadrant of right female breast (04/10/2015); Anxiety; Depression; Breast cancer (2016); Family history of breast cancer; and Headache.    PAST SURGICAL HISTORY: Past Surgical History  Procedure Laterality Date  . Wisdom tooth extraction    . Lasik    . Radioactive seed guided mastectomy with axillary sentinel  lymph node biopsy Right 04/30/2015    Procedure: RADIOACTIVE SEED GUIDED RIGHT LUMPECTOMY WITH RIGHT  AXILLARY SENTINEL LYMPH NODE BIOPSY;  Surgeon: Erroll Luna, MD;  Location: [facility];  Service: General;  Laterality: Right;    FAMILY HISTORY: family history includes Breast cancer (age of onset: 54) in her paternal aunt; Breast cancer (age of onset: 78) in her paternal grandmother; COPD in her maternal grandfather; Heart attack in her paternal grandfather; Hyperlipidemia in her father and mother; Hypertension in her brother, father, and mother; Melanoma (age of onset: 26) in her father; Prostate cancer (age of onset: 80) in her maternal uncle.  SOCIAL HISTORY:  reports that she has never smoked. She does not have any smokeless tobacco history on file. She reports that she drinks alcohol. She reports that she does not use illicit drugs.  ALLERGIES: Amoxicillin  MEDICATIONS:  Current Outpatient Prescriptions  Medication Sig Dispense Refill  . aspirin-acetaminophen-caffeine (EXCEDRIN MIGRAINE) 250-250-65 MG per tablet Take 1 tablet by mouth every 6 (six) hours as needed for migraine.    Marland Kitchen FLUoxetine (PROZAC) 10 MG tablet     . Levonorgestrel 13.5 MG IUD by Intrauterine route.    . Multiple Vitamins-Minerals (MULTIVITAMIN WITH MINERALS) tablet Take 1 tablet by mouth daily.    Marland Kitchen oxyCODONE-acetaminophen (ROXICET) 5-325 MG per tablet Take 1-2 tablets by mouth every 4 (four) hours as needed. 30 tablet 0   No current facility-administered medications for this encounter.    REVIEW OF SYSTEMS:  A 15 point review of systems is documented in the electronic medical record. This was obtained by the nursing staff. However, I reviewed  this with the patient to discuss relevant findings and make appropriate changes. Patient has minimal discomfort in her right breast. She denies any problems with swelling in her right arm or hand. She is scheduled to proceed with a 10 mile run in the near future  in the [place] area.   PHYSICAL EXAM:  vitals were not taken for this visit.  BP 121/69 mmHg  Pulse 91  Temp(Src) 98.4 F (36.9 C) (Oral)  Resp 18  Ht 5' 5"  (1.651 m)  Wt 177 lb 14.4 oz (80.695 kg)  BMI 29.60 kg/m2  General Appearance:    Alert, cooperative, no distress, appears stated age      Lungs are clear. Heart has regular rate and rhythm. No palpable cervical, supraclavicular, or axillary adenopathy. Right breast: peri-aerolar scar with no signs of drainage or infection. Axillary scar on axillary region from senitel node procedure, healing well with no signs of drainage of infection.    LABORATORY DATA:  Lab Results  Component Value Date   WBC 5.4 04/15/2015   HGB 12.8 04/15/2015   HCT 39.8 04/15/2015   MCV 88.9 04/15/2015   PLT 185 04/15/2015   NEUTROABS 2.6 04/15/2015   Lab Results  Component Value Date   NA 141 04/15/2015   K 4.2 04/15/2015   CO2 24 04/15/2015   GLUCOSE 86 04/15/2015   CREATININE 0.8 04/15/2015   CALCIUM 8.9 04/15/2015      RADIOGRAPHY: Nm Sentinel Node Inj-no Rpt (breast)  04/30/2015   CLINICAL DATA: right breast cancer   Sulfur colloid was injected intradermally by the nuclear medicine  technologist for breast cancer sentinel node localization.    Mm Breast Surgical Specimen  04/30/2015   CLINICAL DATA:  Status post radioactive seed localization and lumpectomy right breast.  EXAM: SPECIMEN RADIOGRAPH OF THE RIGHT BREAST  COMPARISON:  Previous exam(s).  FINDINGS: Status post excision of the right breast. The radioactive seed and biopsy marker clip are present, completely intact, and were marked for pathology.  IMPRESSION: Specimen radiograph of the right breast.   Electronically Signed   By: Nolon Nations M.D.   On: 04/30/2015 13:44   Mm Rt Radioactive Seed Loc Mammo Guide  04/29/2015   CLINICAL DATA:  34 year old female for radioactive seed localization prior to right lumpectomy for breast cancer.  EXAM: MAMMOGRAPHIC GUIDED RADIOACTIVE  SEED LOCALIZATION OF THE RIGHT BREAST  COMPARISON:  Previous exam(s).  FINDINGS: Patient presents for radioactive seed localization prior to right lumpectomy. I met with the patient and we discussed the procedure of seed localization including benefits and alternatives. We discussed the high likelihood of a successful procedure. We discussed the risks of the procedure including infection, bleeding, tissue injury and further surgery. We discussed the low dose of radioactivity involved in the procedure. Informed, written consent was given.  The usual time-out protocol was performed immediately prior to the procedure.  Using mammographic guidance, sterile technique, 2% lidocaine and an I-125 radioactive seed, the biopsy clip was localized using a medial approach. The follow-up mammogram images confirm the seed in the expected location and are marked for Dr. Brantley Stage.  Follow-up survey of the patient confirms presence of the radioactive seed.  Order number of I-125 seed:  [id number].  Total activity:  0.261 mCi  Reference Date: 04/03/2015  The patient tolerated the procedure well and was released from the [place]. She was given instructions regarding seed removal.  IMPRESSION: Radioactive seed localization right breast. No apparent complications.   Electronically Signed   By:  Margarette Canada M.D.   On: 04/29/2015 11:37      IMPRESSION: Patient is healing well after first surgery.  PLAN: Will proceed with reexcision to clear the margins. Advised of CT scan with radiation therapy to follow starting July of 2016. Follow up appointment 2-3 weeks post-op with radiation oncology.      This document serves as a record of services personally performed by Gery Pray, MD. It was created on his behalf by Lenn Cal, a trained medical scribe. The creation of this record is based on the scribe's personal observations and the provider's statements to them. This document has been checked and approved by the  attending provider.    ------------------------------------------------  Blair Promise, PhD, MD

## 2015-05-28 NOTE — Telephone Encounter (Signed)
Called patient phone [phone] left voice message asking if she was running late for her 3pm appt, or in traffic or on her way or did she ned to reschedule asked for a call back to [phone] 3:20 PM

## 2015-05-28 NOTE — Telephone Encounter (Signed)
Appointments made and avs printed for patient °

## 2015-05-28 NOTE — Progress Notes (Signed)
Patient Care Team: Carlos Levering, PA-C as PCP - General (Family Medicine) Erroll Luna, MD as Consulting Physician (General Surgery) Nicholas Lose, MD as Consulting Physician (Hematology and Oncology) Gery Pray, MD as Consulting Physician (Radiation Oncology) Rockwell Germany, RN as Registered Nurse Mauro Kaufmann, RN as Registered Nurse Holley Bouche, NP as Nurse Practitioner (Nurse Practitioner)  DIAGNOSIS: Breast cancer of lower-inner quadrant of right female breast   Staging form: Breast, AJCC 7th Edition     Clinical stage from 04/15/2015: Stage IA (T1c, N0, M0) - Unsigned     Pathologic stage from 05/05/2015: Stage IIA (T2, N0, cM0) - Signed by Enid Cutter, MD on 05/12/2015       Staging comments: Staged on final lumpectomy specimen by Dr. 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, ER 100%, PR 100%, Ki-67 30%, HER-2 negative   04/10/2015 Breast MRI Right breast mass 1.7 x 1.7 x 0.9 cm, lymph nodes negative   04/30/2015 Surgery  Right breast lumpectomy: Invasive ductal carcinoma 2.1 cm, positive for lymphovascular invasion, DCIS less than 0.01 millimeters from inferior margin, 2 sentinel nodes negative, grade 3 , ER 99%, PR 100%, HER-2 negative, Ki-67 24%    CHIEF COMPLIANT: Follow-up to discuss Oncotype DX  INTERVAL HISTORY: Kara Meadows is a 34 year old above-mentioned history of right-sided breast cancer treated with colectomy and had an Oncotype DX test done and she is here to discuss the result. She needs another Resection for margins.  REVIEW OF SYSTEMS:   Constitutional: Denies fevers, chills or abnormal weight loss Eyes: Denies blurriness of vision Ears, nose, mouth, throat, and face: Denies mucositis or sore throat Respiratory: Denies cough, dyspnea or wheezes Cardiovascular: Denies palpitation, chest  discomfort or lower extremity swelling Gastrointestinal:  Denies nausea, heartburn or change in bowel habits Skin: Denies abnormal skin rashes Lymphatics: Denies new lymphadenopathy or easy bruising Neurological:Denies numbness, tingling or new weaknesses Behavioral/Psych: Mood is stable, no new changes  Breast:  denies any pain or lumps or nodules in either breasts All other systems were reviewed with the patient and are negative.  I have reviewed the past medical history, past surgical history, social history and family history with the patient and they are unchanged from previous note.  ALLERGIES:  is allergic to amoxicillin.  MEDICATIONS:  Current Outpatient Prescriptions  Medication Sig Dispense Refill  . aspirin-acetaminophen-caffeine (EXCEDRIN MIGRAINE) 250-250-65 MG per tablet Take 1 tablet by mouth every 6 (six) hours as needed for migraine.    Marland Kitchen FLUoxetine (PROZAC) 10 MG tablet     . Levonorgestrel 13.5 MG IUD by Intrauterine route.    . Multiple Vitamins-Minerals (MULTIVITAMIN WITH MINERALS) tablet Take 1 tablet by mouth daily.    Marland Kitchen oxyCODONE-acetaminophen (ROXICET) 5-325 MG per tablet Take 1-2 tablets by mouth every 4 (four) hours as needed. (Patient not taking: Reported on 05/28/2015) 30 tablet 0   No current facility-administered medications for this visit.    PHYSICAL EXAMINATION: ECOG PERFORMANCE STATUS: 0 - Asymptomatic  Filed Vitals:   05/28/15 1432  BP: 121/69  Pulse: 91  Temp: 98.4 F (36.9 C)  Resp: 18   Filed Weights   05/28/15 1432  Weight: 177 lb 9.6 oz (80.559 kg)    GENERAL:alert, no distress and comfortable SKIN: skin color, texture, turgor are normal, no rashes or significant lesions EYES: normal, Conjunctiva are pink  and non-injected, sclera clear OROPHARYNX:no exudate, no erythema and lips, buccal mucosa, and tongue normal  NECK: supple, thyroid normal size, non-tender, without nodularity LYMPH:  no palpable lymphadenopathy in the cervical,  axillary or inguinal LUNGS: clear to auscultation and percussion with normal breathing effort HEART: regular rate & rhythm and no murmurs and no lower extremity edema ABDOMEN:abdomen soft, non-tender and normal bowel sounds Musculoskeletal:no cyanosis of digits and no clubbing  NEURO: alert & oriented x 3 with fluent speech, no focal motor/sensory deficits  LABORATORY DATA:  I have reviewed the data as listed   Chemistry      Component Value Date/Time   NA 141 04/15/2015 1203   K 4.2 04/15/2015 1203   CO2 24 04/15/2015 1203   BUN 12.9 04/15/2015 1203   CREATININE 0.8 04/15/2015 1203      Component Value Date/Time   CALCIUM 8.9 04/15/2015 1203   ALKPHOS 58 04/15/2015 1203   AST 18 04/15/2015 1203   ALT 16 04/15/2015 1203   BILITOT <0.20 04/15/2015 1203       Lab Results  Component Value Date   WBC 5.4 04/15/2015   HGB 12.8 04/15/2015   HCT 39.8 04/15/2015   MCV 88.9 04/15/2015   PLT 185 04/15/2015   NEUTROABS 2.6 04/15/2015   ASSESSMENT & PLAN:  Breast cancer of lower-inner quadrant of right female breast Right breast lumpectomy 04/30/2015: Invasive ductal carcinoma 2.1 cm, positive for lymphovascular invasion, DCIS less than 0.01 millimeters from inferior margin, 2 sentinel nodes negative, grade 3 , ER 99%, PR 100%, HER-2 negative, Ki-67 24%  Recommendation: 1. Oncotype DX Low risk: discussed the result in detail and provided a copy to pt 2. Adj radiation therapy  3. Followed by antiestrogen therapy   RTC after XRT to start anti-estrogen therapy  Tamoxifen counseling: We discussed the risks and benefits of tamoxifen. These include but not limited to insomnia, hot flashes, mood changes, vaginal dryness, and weight gain. Although rare, serious side effects including endometrial cancer, risk of blood clots were also discussed. We strongly believe that the benefits far outweigh the risks. Patient understands these risks and consented to starting treatment. Planned  treatment duration is 5 years.  I discussed the role of ovarian suppression and the results of soft and TEXT trials which showed improvement in survival for patients younger than 40 years with ovarian suppression in combination with aromatase inhibitor. Patient will think more about this option and will let us know when she comes back.  I also discussed the fact that Prozac has an interaction with tamoxifen. She will discuss with her physician whether switching to Effexor is reasonable option.  Patient to stop tamoxifen September  Return to clinic in October for follow-up  No orders of the defined types were placed in this encounter.   The patient has a good understanding of the overall plan. she agrees with it. she will call with any problems that may develop before the next visit here.   Rulon Eisenmenger, MD     He is a little finger being who has been on treatment, he is ill(patient just received Unasyn 1 change in the

## 2015-05-28 NOTE — Progress Notes (Signed)
Please see the Nurse Progress Note in the MD Initial Consult Encounter for this patient. 

## 2015-05-29 ENCOUNTER — Encounter (HOSPITAL_BASED_OUTPATIENT_CLINIC_OR_DEPARTMENT_OTHER): Payer: Self-pay | Admitting: *Deleted

## 2015-05-29 ENCOUNTER — Encounter: Payer: Self-pay | Admitting: *Deleted

## 2015-05-29 NOTE — Progress Notes (Signed)
[place] Psychosocial Distress Screening Clinical Social Work  Clinical Social Work was referred by distress screening protocol.  The patient scored a 6 on the Psychosocial Distress Thermometer which indicates moderate distress. Clinical Social Worker phoned pt to assess for distress and other psychosocial needs. CSW left message and awaits return call.   ONCBCN DISTRESS SCREENING 05/28/2015  Screening Type Initial Screening  Distress experienced in past week (1-10) 6  Emotional problem type Depression;Nervousness/Anxiety;Isolation/feeling alone;Adjusting to appearance changes  Physician notified of physical symptoms Yes  Referral to clinical social work Yes     Clinical Social Worker follow up needed: Yes.    If yes, follow up plan: See above Loren Racer, [place] Worker [place]  [facility] Phone: [phone] Fax: [phone]

## 2015-06-03 LAB — CBC WITH AUTO DIFFERENTIAL
Absolute Baso #: 0 10*3/uL (ref 0.0–0.2)
Absolute Eos #: 0.2 10*3/uL (ref 0.0–0.5)
Absolute Lymph #: 2.5 10*3/uL (ref 1.0–4.3)
Absolute Mono #: 0.5 10*3/uL (ref 0.0–0.8)
Absolute Neut #: 6.1 10*3/uL (ref 1.8–7.0)
Basophils: 0.4 %
Eosinophils: 1.8 %
Granulocytes %: 65.2 %
Hematocrit: 39.4 % (ref 35.0–47.0)
Hemoglobin: 13 g/dL (ref 11.7–16.0)
Lymphocyte %: 26.8 %
MCH: 28.9 pg (ref 26.0–34.0)
MCHC: 33.1 % (ref 32.0–36.0)
MCV: 87.4 fL (ref 79.0–98.0)
MPV: 8.4 fL (ref 7.4–10.4)
Monocytes: 5.8 %
Platelets: 281 10*3/uL (ref 140–440)
RBC: 4.51 10*6/uL (ref 3.80–5.20)
RDW: 13.9 % (ref 11.5–14.5)
WBC: 9.4 10*3/uL (ref 3.6–10.7)

## 2015-06-03 LAB — COMPREHENSIVE METABOLIC PANEL
ALT: 13 U/L (ref 12–78)
AST: 19 U/L (ref 15–37)
Albumin,Serum: 3.3 g/dL — ABNORMAL LOW (ref 3.4–5.0)
Alkaline Phosphatase: 106 U/L (ref 45–117)
Anion Gap: 7 NA
BUN: 9 mg/dL (ref 7–25)
CO2: 25 mmol/L (ref 21–32)
Calcium: 7.9 mg/dL — ABNORMAL LOW (ref 8.2–10.1)
Chloride: 106 mmol/L (ref 98–109)
Creatinine: 0.69 mg/dL (ref 0.55–1.40)
EGFR IF NonAfrican American: >60 mL/min (ref >60)
Glucose: 97 mg/dL (ref 70–100)
Potassium: 3.8 mmol/L (ref 3.5–5.1)
Sodium: 138 mmol/L (ref 135–145)
Total Bilirubin: 0.2 mg/dL (ref 0.2–1.0)
Total Protein: 6.9 g/dL (ref 6.4–8.2)
eGFR African American: >60 mL/min (ref >60)

## 2015-06-04 ENCOUNTER — Ambulatory Visit (HOSPITAL_BASED_OUTPATIENT_CLINIC_OR_DEPARTMENT_OTHER): Payer: BC Managed Care – PPO | Admitting: Anesthesiology

## 2015-06-04 ENCOUNTER — Encounter (HOSPITAL_BASED_OUTPATIENT_CLINIC_OR_DEPARTMENT_OTHER): Payer: Self-pay | Admitting: *Deleted

## 2015-06-04 ENCOUNTER — Ambulatory Visit (HOSPITAL_BASED_OUTPATIENT_CLINIC_OR_DEPARTMENT_OTHER)
Admission: RE | Admit: 2015-06-04 | Discharge: 2015-06-04 | Disposition: A | Discharge status: Home / Self Care | Payer: BC Managed Care – PPO | Source: Ambulatory Visit | Attending: Surgery | Admitting: Surgery

## 2015-06-04 ENCOUNTER — Encounter (HOSPITAL_BASED_OUTPATIENT_CLINIC_OR_DEPARTMENT_OTHER)
Admission: RE | Disposition: A | Discharge status: Home / Self Care | Payer: Self-pay | Source: Ambulatory Visit | Attending: Surgery

## 2015-06-04 DIAGNOSIS — Z170 Estrogen receptor positive status [ER+]: Secondary | ICD-10-CM | POA: Insufficient documentation

## 2015-06-04 DIAGNOSIS — G43909 Migraine, unspecified, not intractable, without status migrainosus: Secondary | ICD-10-CM | POA: Insufficient documentation

## 2015-06-04 DIAGNOSIS — F419 Anxiety disorder, unspecified: Secondary | ICD-10-CM | POA: Insufficient documentation

## 2015-06-04 DIAGNOSIS — F329 Major depressive disorder, single episode, unspecified: Secondary | ICD-10-CM | POA: Diagnosis not present

## 2015-06-04 DIAGNOSIS — C50911 Malignant neoplasm of unspecified site of right female breast: Secondary | ICD-10-CM | POA: Insufficient documentation

## 2015-06-04 DIAGNOSIS — Z880 Allergy status to penicillin: Secondary | ICD-10-CM | POA: Insufficient documentation

## 2015-06-04 HISTORY — DX: Migraine, unspecified, not intractable, without status migrainosus: G43.909

## 2015-06-04 HISTORY — PX: RE-EXCISION OF BREAST LUMPECTOMY: SHX6048

## 2015-06-04 LAB — POCT HEMOGLOBIN-HEMACUE: Hemoglobin: 13.6 g/dL (ref 12.0–15.0)

## 2015-06-04 SURGERY — EXCISION, LESION, BREAST
Anesthesia: General | Site: Breast | Laterality: Right

## 2015-06-04 MED ORDER — CLINDAMYCIN PHOSPHATE 300 MG/50ML IV SOLN
300.0000 mg | Freq: Once | INTRAVENOUS | Status: AC
  Administered 2015-06-04: 300 mg via INTRAVENOUS

## 2015-06-04 MED ORDER — HYDROCODONE-ACETAMINOPHEN 5-325 MG PO TABS: 1.0000 | ORAL_TABLET | Freq: Four times a day (QID) | ORAL | Status: DC | PRN

## 2015-06-04 MED ORDER — ONDANSETRON HCL 4 MG/2ML IJ SOLN
INTRAMUSCULAR | Status: DC | PRN
  Administered 2015-06-04: 4 mg via INTRAVENOUS

## 2015-06-04 MED ORDER — DEXAMETHASONE SODIUM PHOSPHATE 4 MG/ML IJ SOLN
INTRAMUSCULAR | Status: DC | PRN
  Administered 2015-06-04: 10 mg via INTRAVENOUS

## 2015-06-04 MED ORDER — MEPERIDINE HCL 25 MG/ML IJ SOLN: 6.2500 mg | INTRAMUSCULAR | Status: DC | PRN

## 2015-06-04 MED ORDER — MIDAZOLAM HCL 2 MG/2ML IJ SOLN
INTRAMUSCULAR | Status: AC
  Filled 2015-06-04: qty 2

## 2015-06-04 MED ORDER — OXYCODONE HCL 5 MG PO TABS
ORAL_TABLET | ORAL | Status: AC
  Filled 2015-06-04: qty 1

## 2015-06-04 MED ORDER — HYDROMORPHONE HCL 1 MG/ML IJ SOLN
INTRAMUSCULAR | Status: AC
  Filled 2015-06-04: qty 1

## 2015-06-04 MED ORDER — FENTANYL CITRATE (PF) 100 MCG/2ML IJ SOLN
50.0000 ug | INTRAMUSCULAR | Status: DC | PRN
Start: 2015-06-04 — End: 2015-06-04

## 2015-06-04 MED ORDER — LIDOCAINE HCL (CARDIAC) 20 MG/ML IV SOLN
INTRAVENOUS | Status: DC | PRN
  Administered 2015-06-04: 50 mg via INTRAVENOUS

## 2015-06-04 MED ORDER — LACTATED RINGERS IV SOLN
INTRAVENOUS | Status: DC
  Administered 2015-06-04: 07:00:00 via INTRAVENOUS

## 2015-06-04 MED ORDER — BUPIVACAINE-EPINEPHRINE (PF) 0.25% -1:200000 IJ SOLN
INTRAMUSCULAR | Status: AC
  Filled 2015-06-04: qty 30

## 2015-06-04 MED ORDER — MIDAZOLAM HCL 2 MG/2ML IJ SOLN
1.0000 mg | INTRAMUSCULAR | Status: DC | PRN
Start: 2015-06-04 — End: 2015-06-04

## 2015-06-04 MED ORDER — CHLORHEXIDINE GLUCONATE 4 % EX LIQD: 1.0000 "application " | Freq: Once | CUTANEOUS | Status: DC

## 2015-06-04 MED ORDER — MIDAZOLAM HCL 5 MG/5ML IJ SOLN
INTRAMUSCULAR | Status: DC | PRN
  Administered 2015-06-04: 2 mg via INTRAVENOUS

## 2015-06-04 MED ORDER — GLYCOPYRROLATE 0.2 MG/ML IJ SOLN
INTRAMUSCULAR | Status: DC | PRN
  Administered 2015-06-04: 0.2 mg via INTRAVENOUS

## 2015-06-04 MED ORDER — OXYCODONE HCL 5 MG PO TABS
5.0000 mg | ORAL_TABLET | Freq: Once | ORAL | Status: AC | PRN
  Administered 2015-06-04: 5 mg via ORAL

## 2015-06-04 MED ORDER — PROPOFOL 500 MG/50ML IV EMUL
INTRAVENOUS | Status: AC
  Filled 2015-06-04: qty 50

## 2015-06-04 MED ORDER — FENTANYL CITRATE (PF) 100 MCG/2ML IJ SOLN
INTRAMUSCULAR | Status: DC | PRN
Start: 2015-06-04 — End: 2015-06-04
  Administered 2015-06-04: 100 ug via INTRAVENOUS

## 2015-06-04 MED ORDER — HYDROMORPHONE HCL 1 MG/ML IJ SOLN
0.2500 mg | INTRAMUSCULAR | Status: DC | PRN
  Administered 2015-06-04 (×2): 0.5 mg via INTRAVENOUS

## 2015-06-04 MED ORDER — BUPIVACAINE-EPINEPHRINE 0.25% -1:200000 IJ SOLN
INTRAMUSCULAR | Status: DC | PRN
  Administered 2015-06-04: 9 mL

## 2015-06-04 MED ORDER — PROPOFOL 10 MG/ML IV BOLUS
INTRAVENOUS | Status: AC
  Filled 2015-06-04: qty 120

## 2015-06-04 MED ORDER — GLYCOPYRROLATE 0.2 MG/ML IJ SOLN: 0.2000 mg | Freq: Once | INTRAMUSCULAR | Status: DC | PRN

## 2015-06-04 MED ORDER — CLINDAMYCIN PHOSPHATE 300 MG/50ML IV SOLN
INTRAVENOUS | Status: AC
  Filled 2015-06-04: qty 50

## 2015-06-04 MED ORDER — PROPOFOL 10 MG/ML IV BOLUS
INTRAVENOUS | Status: DC | PRN
  Administered 2015-06-04 (×2): 50 mg via INTRAVENOUS
  Administered 2015-06-04: 200 mg via INTRAVENOUS

## 2015-06-04 MED ORDER — FENTANYL CITRATE (PF) 100 MCG/2ML IJ SOLN
INTRAMUSCULAR | Status: AC
  Filled 2015-06-04: qty 6

## 2015-06-04 MED ORDER — OXYCODONE HCL 5 MG/5ML PO SOLN
5.0000 mg | Freq: Once | ORAL | Status: AC | PRN
Start: 2015-06-04 — End: 2015-06-04

## 2015-06-04 SURGICAL SUPPLY — 49 items
APPLIER CLIP 9.375 MED OPEN (MISCELLANEOUS) ×2
BINDER BREAST LRG (GAUZE/BANDAGES/DRESSINGS) IMPLANT
BINDER BREAST MEDIUM (GAUZE/BANDAGES/DRESSINGS) IMPLANT
BINDER BREAST XLRG (GAUZE/BANDAGES/DRESSINGS) ×2 IMPLANT
BINDER BREAST XXLRG (GAUZE/BANDAGES/DRESSINGS) IMPLANT
BLADE SURG 15 STRL LF DISP TIS (BLADE) ×1 IMPLANT
BLADE SURG 15 STRL SS (BLADE) ×1
CANISTER SUCT 1200ML W/VALVE (MISCELLANEOUS) ×2 IMPLANT
CHLORAPREP W/TINT 26ML (MISCELLANEOUS) ×2 IMPLANT
CLIP APPLIE 9.375 MED OPEN (MISCELLANEOUS) ×1 IMPLANT
CLIP TI WIDE RED SMALL 6 (CLIP) IMPLANT
COVER BACK TABLE 60X90IN (DRAPES) ×2 IMPLANT
COVER MAYO STAND STRL (DRAPES) ×2 IMPLANT
DECANTER SPIKE VIAL GLASS SM (MISCELLANEOUS) ×2 IMPLANT
DEVICE DUBIN W/COMP PLATE 8390 (MISCELLANEOUS) IMPLANT
DRAPE LAPAROSCOPIC ABDOMINAL (DRAPES) IMPLANT
DRAPE LAPAROTOMY 100X72 PEDS (DRAPES) ×2 IMPLANT
DRAPE UTILITY XL STRL (DRAPES) ×2 IMPLANT
ELECT COATED BLADE 2.86 ST (ELECTRODE) ×2 IMPLANT
ELECT REM PT RETURN 9FT ADLT (ELECTROSURGICAL) ×2
ELECTRODE REM PT RTRN 9FT ADLT (ELECTROSURGICAL) ×1 IMPLANT
GLOVE BIOGEL PI IND STRL 7.0 (GLOVE) ×1 IMPLANT
GLOVE BIOGEL PI IND STRL 7.5 (GLOVE) ×1 IMPLANT
GLOVE BIOGEL PI IND STRL 8 (GLOVE) ×1 IMPLANT
GLOVE BIOGEL PI INDICATOR 7.0 (GLOVE) ×1
GLOVE BIOGEL PI INDICATOR 7.5 (GLOVE) ×1
GLOVE BIOGEL PI INDICATOR 8 (GLOVE) ×1
GLOVE ECLIPSE 8.0 STRL XLNG CF (GLOVE) ×2 IMPLANT
GLOVE SURG SS PI 6.5 STRL IVOR (GLOVE) ×2 IMPLANT
GLOVE SURG SS PI 7.5 STRL IVOR (GLOVE) ×2 IMPLANT
GOWN STRL REUS W/ TWL LRG LVL3 (GOWN DISPOSABLE) ×2 IMPLANT
GOWN STRL REUS W/TWL LRG LVL3 (GOWN DISPOSABLE) ×2
KIT MARKER MARGIN INK (KITS) ×2 IMPLANT
LIQUID BAND (GAUZE/BANDAGES/DRESSINGS) ×2 IMPLANT
NEEDLE HYPO 25X1 1.5 SAFETY (NEEDLE) ×2 IMPLANT
NS IRRIG 1000ML POUR BTL (IV SOLUTION) ×2 IMPLANT
PACK BASIN DAY SURGERY FS (CUSTOM PROCEDURE TRAY) ×2 IMPLANT
PENCIL BUTTON HOLSTER BLD 10FT (ELECTRODE) ×2 IMPLANT
SLEEVE SCD COMPRESS KNEE MED (MISCELLANEOUS) ×2 IMPLANT
SPONGE LAP 4X18 X RAY DECT (DISPOSABLE) ×2 IMPLANT
STAPLER VISISTAT 35W (STAPLE) IMPLANT
SUT MON AB 4-0 PC3 18 (SUTURE) ×2 IMPLANT
SUT SILK 2 0 SH (SUTURE) IMPLANT
SUT VICRYL 3-0 CR8 SH (SUTURE) ×2 IMPLANT
SYR CONTROL 10ML LL (SYRINGE) ×2 IMPLANT
TOWEL OR 17X24 6PK STRL BLUE (TOWEL DISPOSABLE) ×4 IMPLANT
TOWEL OR NON WOVEN STRL DISP B (DISPOSABLE) ×2 IMPLANT
TUBE CONNECTING 20X1/4 (TUBING) ×2 IMPLANT
YANKAUER SUCT BULB TIP NO VENT (SUCTIONS) ×2 IMPLANT

## 2015-06-04 NOTE — Interval H&P Note (Signed)
History and Physical Interval Note:  06/04/2015 7:22 AM  Echo D Killian  has presented today for surgery, with the diagnosis of Right Breast Cancer  The various methods of treatment have been discussed with the patient and family. After consideration of risks, benefits and other options for treatment, the patient has consented to  Procedure(s): RIGHT BREAST RE-EXCISION OF BREAST LUMPECTOMY (Right) as a surgical intervention .  The patient's history has been reviewed, patient examined, no change in status, stable for surgery.  I have reviewed the patient's chart and labs.  Questions were answered to the patient's satisfaction.     Kara Meadows A.

## 2015-06-04 NOTE — H&P (Signed)
Kara Meadows is an 34 y.o. female.   Chief Complaint: RIGHT BREAST CANCER  HPI: PT RETURNS FOR RE EXCISION  RIGHT BREAST CANCER FOR CLOSE MARGINS .   Past Medical History  Diagnosis Date  . Anxiety   . Depression   . Migraines     rare  . Breast cancer of lower-outer quadrant of right female breast 04/10/2015    ER+/PR+/Her2-    Past Surgical History  Procedure Laterality Date  . Wisdom tooth extraction    . Lasik    . Radioactive seed guided mastectomy with axillary sentinel lymph node biopsy Right 04/30/2015    Procedure: RADIOACTIVE SEED GUIDED RIGHT LUMPECTOMY WITH RIGHT  AXILLARY SENTINEL LYMPH NODE BIOPSY;  Surgeon: Erroll Luna, MD;  Location: [facility];  Service: General;  Laterality: Right;    Family History  Problem Relation Age of Onset  . Hyperlipidemia Mother   . Hypertension Mother   . Hypertension Father   . Hyperlipidemia Father   . Melanoma Father 41  . Hypertension Brother   . Prostate cancer Maternal Uncle 22  . Breast cancer Paternal Aunt 57  . Breast cancer Paternal Grandmother 1  . COPD Maternal Grandfather   . Heart attack Paternal Grandfather    Social History:  reports that she has never smoked. She has never used smokeless tobacco. She reports that she drinks alcohol. She reports that she does not use illicit drugs.  Allergies:  Allergies  Allergen Reactions  . Amoxicillin Rash    AS A CHILD    Medications Prior to Admission  Medication Sig Dispense Refill  . FLUoxetine (PROZAC) 10 MG tablet Take 30 mg by mouth daily.     Marland Kitchen ibuprofen (ADVIL,MOTRIN) 400 MG tablet Take 400 mg by mouth every 8 (eight) hours as needed. Maybe once a week    . Levonorgestrel 13.5 MG IUD by Intrauterine route.    . tamoxifen (NOLVADEX) 20 MG tablet Take 1 tablet (20 mg total) by mouth daily. Start September 1st 90 tablet 3    No results found for this or any previous visit (from the past 48 hour(s)). No results found.  Review of Systems   Constitutional: Negative.     Blood pressure 117/79, pulse 65, temperature 98.3 F (36.8 C), temperature source Oral, resp. rate 16, height 5' 5"  (1.651 m), weight 83.008 kg (183 lb), SpO2 100 %. Physical Exam  Cardiovascular: Normal rate.   Respiratory: Effort normal.  RIGHT BREAST INCISION INTACT   Skin: Skin is warm and dry.     Assessment/Plan Re excision right breast for  close margins for stage 2 right breast cancer/ DCIS    [name] A. 06/04/2015, 7:19 AM

## 2015-06-04 NOTE — Anesthesia Postprocedure Evaluation (Signed)
  Anesthesia Post-op Note  Patient: Kara Meadows  Procedure(s) Performed: Procedure(s): RIGHT BREAST RE-EXCISION OF BREAST LUMPECTOMY (Right)  Patient Location: PACU  Anesthesia Type: General   Level of Consciousness: awake, alert  and oriented  Airway and Oxygen Therapy: Patient Spontanous Breathing  Post-op Pain: mild  Post-op Assessment: Post-op Vital signs reviewed  Post-op Vital Signs: Reviewed  Last Vitals:  Filed Vitals:   06/04/15 0915  BP: 114/73  Pulse: 77  Temp: 37.1 C  Resp: 16    Complications: No apparent anesthesia complications

## 2015-06-04 NOTE — Discharge Instructions (Signed)
[facility] °Office Phone Number [phone] ° °BREAST BIOPSY/ PARTIAL MASTECTOMY: POST OP INSTRUCTIONS ° °Always review your discharge instruction sheet given to you by the facility where your surgery was performed. ° °IF YOU HAVE DISABILITY OR FAMILY LEAVE FORMS, YOU MUST BRING THEM TO THE OFFICE FOR PROCESSING.  DO NOT GIVE THEM TO YOUR DOCTOR. ° °1. A prescription for pain medication may be given to you upon discharge.  Take your pain medication as prescribed, if needed.  If narcotic pain medicine is not needed, then you may take acetaminophen (Tylenol) or ibuprofen (Advil) as needed. °2. Take your usually prescribed medications unless otherwise directed °3. If you need a refill on your pain medication, please contact your pharmacy.  They will contact our office to request authorization.  Prescriptions will not be filled after 5pm or on week-ends. °4. You should eat very light the first 24 hours after surgery, such as soup, crackers, pudding, etc.  Resume your normal diet the day after surgery. °5. Most patients will experience some swelling and bruising in the breast.  Ice packs and a good support bra will help.  Swelling and bruising can take several days to resolve.  °6. It is common to experience some constipation if taking pain medication after surgery.  Increasing fluid intake and taking a stool softener will usually help or prevent this problem from occurring.  A mild laxative (Milk of Magnesia or Miralax) should be taken according to package directions if there are no bowel movements after 48 hours. °7. Unless discharge instructions indicate otherwise, you may remove your bandages 24-48 hours after surgery, and you may shower at that time.  You may have steri-strips (small skin tapes) in place directly over the incision.  These strips should be left on the skin for 7-10 days.  If your surgeon used skin glue on the incision, you may shower in 24 hours.  The glue will flake off over the  next 2-3 weeks.  Any sutures or staples will be removed at the office during your follow-up visit. °8. ACTIVITIES:  You may resume regular daily activities (gradually increasing) beginning the next day.  Wearing a good support bra or sports bra minimizes pain and swelling.  You may have sexual intercourse when it is comfortable. °a. You may drive when you no longer are taking prescription pain medication, you can comfortably wear a seatbelt, and you can safely maneuver your car and apply brakes. °b. RETURN TO WORK:  ______________________________________________________________________________________ °9. You should see your doctor in the office for a follow-up appointment approximately two weeks after your surgery.  Your doctor’s nurse will typically make your follow-up appointment when she calls you with your pathology report.  Expect your pathology report 2-3 business days after your surgery.  You may call to check if you do not hear from us after three days. °10. OTHER INSTRUCTIONS: _______________________________________________________________________________________________ _____________________________________________________________________________________________________________________________________ °_____________________________________________________________________________________________________________________________________ °_____________________________________________________________________________________________________________________________________ ° °WHEN TO CALL YOUR DOCTOR: °1. Fever over 101.0 °2. Nausea and/or vomiting. °3. Extreme swelling or bruising. °4. Continued bleeding from incision. °5. Increased pain, redness, or drainage from the incision. ° °The clinic staff is available to answer your questions during regular business hours.  Please don’t hesitate to call and ask to speak to one of the nurses for clinical concerns.  If you have a medical emergency, go to the nearest  emergency room or call 911.  A surgeon from [facility] is always on call at the hospital. ° °For further questions, please visit [email or web address]  ° ° ° °  Post Anesthesia Home Care Instructions ° °Activity: °Get plenty of rest for the remainder of the day. A responsible adult should stay with you for 24 hours following the procedure.  °For the next 24 hours, DO NOT: °-Drive a car °-Operate machinery °-Drink alcoholic beverages °-Take any medication unless instructed by your physician °-Make any legal decisions or sign important papers. ° °Meals: °Start with liquid foods such as gelatin or soup. Progress to regular foods as tolerated. Avoid greasy, spicy, heavy foods. If nausea and/or vomiting occur, drink only clear liquids until the nausea and/or vomiting subsides. Call your physician if vomiting continues. ° °Special Instructions/Symptoms: °Your throat may feel dry or sore from the anesthesia or the breathing tube placed in your throat during surgery. If this causes discomfort, gargle with warm salt water. The discomfort should disappear within 24 hours. ° °If you had a scopolamine patch placed behind your ear for the management of post- operative nausea and/or vomiting: ° °1. The medication in the patch is effective for 72 hours, after which it should be removed.  Wrap patch in a tissue and discard in the trash. Wash hands thoroughly with soap and water. °2. You may remove the patch earlier than 72 hours if you experience unpleasant side effects which may include dry mouth, dizziness or visual disturbances. °3. Avoid touching the patch. Wash your hands with soap and water after contact with the patch. °  ° °

## 2015-06-04 NOTE — Anesthesia Procedure Notes (Signed)
Procedure Name: LMA Insertion Date/Time: 06/04/2015 7:27 AM Performed by: Toula Moos L Pre-anesthesia Checklist: Patient identified, Emergency Drugs available, Suction available, Patient being monitored and Timeout performed Patient Re-evaluated:Patient Re-evaluated prior to inductionOxygen Delivery Method: Circle System Utilized Preoxygenation: Pre-oxygenation with 100% oxygen Intubation Type: IV induction Ventilation: Mask ventilation without difficulty LMA: LMA inserted LMA Size: 4.0 Number of attempts: 1 Airway Equipment and Method: Bite block Placement Confirmation: positive ETCO2 Tube secured with: Tape Dental Injury: Teeth and Oropharynx as per pre-operative assessment

## 2015-06-04 NOTE — Anesthesia Preprocedure Evaluation (Signed)
Anesthesia Evaluation  Patient identified by MRN, date of birth, ID band Patient awake    Reviewed: Allergy & Precautions, NPO status , Patient's Chart, lab work & pertinent test results  Airway Mallampati: I  TM Distance: >3 FB Neck ROM: Full    Dental  (+) Teeth Intact, Dental Advisory Given   Pulmonary  breath sounds clear to auscultation        Cardiovascular Rhythm:Regular Rate:Normal     Neuro/Psych    GI/Hepatic   Endo/Other    Renal/GU      Musculoskeletal   Abdominal   Peds  Hematology   Anesthesia Other Findings   Reproductive/Obstetrics                             Anesthesia Physical Anesthesia Plan  ASA: II  Anesthesia Plan: General   Post-op Pain Management:    Induction: Intravenous  Airway Management Planned: LMA  Additional Equipment:   Intra-op Plan:   Post-operative Plan: Extubation in OR  Informed Consent: I have reviewed the patients History and Physical, chart, labs and discussed the procedure including the risks, benefits and alternatives for the proposed anesthesia with the patient or authorized representative who has indicated his/her understanding and acceptance.   Dental advisory given  Plan Discussed with: CRNA, Anesthesiologist and Surgeon  Anesthesia Plan Comments:         Anesthesia Quick Evaluation

## 2015-06-04 NOTE — Op Note (Signed)
Right Breast Re-excison Lumpectomy Procedure Note  Indications:  This patient returns following an initial lumpectomy.  Analysis of the pathology specimen revealed microscopic involvement of the inferior margins.  The patient now returns for re-excision.  Pre-operative Diagnosis: right breast cancer  Post-operative Diagnosis: right breast cancer  Surgeon: Erroll Luna A.   Assistants: OR staff   Anesthesia: General LMA anesthesia and Local anesthesia 0.25.% bupivacaine, with epinephrine  ASA Class: 2  Procedure Details  The patient was seen in the Holding Room. The risks, benefits, complications, treatment options, and expected outcomes were discussed with the patient. The possibilities of reaction to medication, pulmonary aspiration, bleeding, infection, the need for additional procedures, failure to diagnose a condition, and creating a complication requiring transfusion or operation were discussed with the patient. The patient concurred with the proposed plan, giving informed consent.  The site of surgery properly noted/marked. The patient was taken to Operating Room # 8, identified as Minoa Bing and the procedure verified as  Right Breast Re-excision Lumpectomy. A Time Out was held and the above information confirmed.  the patient was placed supine.  The breast was prepped and draped in standard fashion. Marcaine 0.25% with epinephrine was used to anesthetize the skin around the previous lumpectomy incision.  The incision was opened.  A  seroma was evacuated.  Additional local anesthesia was delivered medial, lateral, posterior, anterior, inferior and superiorly within the lumpectomy cavity.  A full thickness re-excision was performed.  An orientation with paint kit used and all margins were excised.   The new margin was inked and the specimen was submitted to pathology.  Hemostasis was achieved with cautery.  Closure was performed in 2 layers with a 3-0 Vicryl  And 4 O  monocryl  subcuticular closure.    Liquid adhesive  applied.  At the end of the operation, all sponge, instrument and needle counts were correct.   Findings: grossly clear surgical margins  Estimated Blood Loss:  Minimal         Drains: none         Total IV Fluids: 400 ml         Specimens: see above          Complications:  None; patient tolerated the procedure well.         Disposition: PACU - hemodynamically stable.         Condition: stable  Attending Attestation: I performed the procedure.

## 2015-06-04 NOTE — Transfer of Care (Signed)
Immediate Anesthesia Transfer of Care Note  Patient: Kara Meadows  Procedure(s) Performed: Procedure(s): RIGHT BREAST RE-EXCISION OF BREAST LUMPECTOMY (Right)  Patient Location: PACU  Anesthesia Type:General  Level of Consciousness: awake and patient cooperative  Airway & Oxygen Therapy: Patient Spontanous Breathing and Patient connected to face mask oxygen  Post-op Assessment: Report given to RN and Post -op Vital signs reviewed and stable  Post vital signs: Reviewed and stable  Last Vitals:  Filed Vitals:   06/04/15 0812  BP:   Pulse: 97  Temp:   Resp:     Complications: No apparent anesthesia complications

## 2015-06-05 ENCOUNTER — Encounter (HOSPITAL_BASED_OUTPATIENT_CLINIC_OR_DEPARTMENT_OTHER): Payer: Self-pay | Admitting: Surgery

## 2015-06-09 ENCOUNTER — Encounter: Payer: Self-pay | Admitting: *Deleted

## 2015-06-09 LAB — HEP C RNA, QUANT (VIRAL LOAD)
Hepatitis C RNA Quant Log: NOT DETECTED {Log_IU} (ref <1.18)
Hepatitis C RNA Quantitative: NOT DETECTED [IU]/mL (ref <15)

## 2015-06-09 NOTE — Progress Notes (Signed)
[place] Work  Clinical Social Work was referred by patient navigator for assessment of psychosocial needs.  Clinical Social Worker met with patient in office at [facility] to offer support and assess for needs.  Patient expressed increased emotional stress since her diagnosis and interest in counseling options.  CSW offered patient additional support and provided a space for patient to express her feelings and concerns.  CSW and patient discussed the different counseling options available and [facility] and in the community.  Patient stated that she has a history of anxiety and depression that was well managed and she had received counseling in the past.  After discussing patients need she determined it would be appropriate to establish care outside of the cancer center.   This would provide patient with the opportunity to establish a long term counseling relationship beyond her cancer diagnosis.  CSW informed patient that CSw is always available for support and will continue to support patient through her treatment and survivorship.  CSW provided patient with counseling resources in the community.  Patient plans to review and make an appointment.  Patient will continue to meet with CSW while she awaits her initial appointment with the counselor in the community.  Patient is scheduled to meet with CSW in 2 weeks.    Johnnye Lana, MSW, LCSW, OSW-C Clinical Social Worker [facility] [phone]

## 2015-06-16 NOTE — Progress Notes (Addendum)
Location of Breast Cancer: Breast cancer of lower-inner quadrant of right female breast  Histology per Pathology Report:   06/04/15 Diagnosis 1. Breast, excision, Right anterior - BENIGN BREAST TISSUE WITH FAT NECROSIS, CHRONIC INFLAMMATION AND GIANT CELLS REACTION. - NO TUMOR SEEN. 2. Breast, excision, Right posterior - BENIGN BREAST PARENCHYMA WITH ACUTE AND CHRONIC INFLAMMATION, GIANT CELL REACTION AND FAT NECROSIS. - FORIEGN MATERIAL CONSISTENT WITH SUTURE MATERIAL PRESENT. - NO TUMOR SEEN. - SEE COMMENT. 3. Breast, excision, Right superior - BENIGN FIBROFATTY AND BREAST TISSUE WITH CHRONIC INFLAMMATION AND GIANT CELL REACTION. - NO TUMOR SEEN. 4. Breast, excision, Right medial - BENIGN BREAST TISSUE WITH CHRONIC INFLAMMATION, GIANT CELL REACTION AND FAT NECROSIS. - NO TUMOR SEEN. 5. Breast, excision, Right lateral - BENIGN BREAST TISSUE WITH CHRONIC INFLAMMATION, GIANT CELL REACTION AND FAT NECROSIS. - NO TUMOR SEEN. 6. Breast, excision, Right inferior - BENIGN FIBROFATTY SOFT TISSUE WITH CHRONIC INFLAMMATION, GIANT CELL REACTION AND FAT NECROSIS. - NO TUMOR SEEN.  04/30/15 Diagnosis 1. Breast, lumpectomy, Right - INVASIVE DUCTAL CARCINOMA, SEE COMMENT. - POSITIVE FOR LYMPH VASCULAR INVASION - INVASIVE TUMOR IS 0.01 MM FROM NEAREST INFERIOR MARGIN. - DUCTAL CARCINOMA IN SITU - IN SITU CARCINOMA IS LESS THAN 0.01 MM FROM NEAREST INFERIOR MARGIN - PREVIOUS BIOPSY SITE. 2. Lymph node, sentinel, biopsy, Right axillary - ONE LYMPH NODE, NEGATIVE FOR TUMOR (0/1). 3. Lymph node, sentinel, biopsy, Right axillary - ONE LYMPH NODE, NEGATIVE FOR TUMOR (0/1).  04/06/15 Diagnosis Breast, right, needle core biopsy, 4 o'clock - INVASIVE DUCTAL CARCINOMA. - DUCTAL CARCINOMA IN SITU. - SEE COMMENT.  Receptor Status: ER(99%), PR (100%), Her2-neu (negative)  Did patient present with symptoms (if so, please note symptoms) or was this found on screening mammography?: Earlier this year  on self-examination the patient palpated a lesion within the lower inner quadrant of the right breast.   Past/Anticipated interventions by surgeon, if any: 04/30/15 -Procedure: RADIOACTIVE SEED GUIDED RIGHT LUMPECTOMY WITH RIGHT AXILLARY SENTINEL LYMPH NODE BIOPSY; Surgeon: Erroll Luna, MD; Location: [place]; Service: General; Laterality: Right; 06/04/15 - Procedure: RIGHT BREAST RE-EXCISION OF BREAST LUMPECTOMY;  Surgeon: Erroll Luna, MD;  Location: [place];  Service: General;  Laterality: Right;  Past/Anticipated interventions by medical oncology, if any: Per Dr. Lindi Adie - Oncotype DX to determine chemotherapy benefit. Followed by radiation therapy. Followed by antiestrogen therapy .  Lymphedema issues, if any: no  Pain issues, if any: Has some soreness/sentsitivity in right nipple  SAFETY ISSUES:  Prior radiation?NO  Pacemaker/ICD? {NO  Possible current pregnancy?NO  Is the patient on methotrexate? no  Current Complaints / other details: Married, no children, GxPO  BP 119/77 mmHg  Pulse 63  Temp(Src) 98.1 F (36.7 C) (Oral)  Resp 16  Ht 5' 5"  (1.651 m)  Wt 177 lb 12.8 oz (80.65 kg)  BMI 29.59 kg/m2

## 2015-06-17 ENCOUNTER — Encounter: Payer: Self-pay | Admitting: Radiation Oncology

## 2015-06-17 ENCOUNTER — Ambulatory Visit
Admission: RE | Admit: 2015-06-17 | Discharge: 2015-06-17 | Disposition: A | Discharge status: Home / Self Care | Payer: BC Managed Care – PPO | Source: Ambulatory Visit | Attending: Radiation Oncology | Admitting: Radiation Oncology

## 2015-06-17 VITALS — BP 119/77 | HR 63 | Temp 98.1°F | Resp 16 | Ht 65.0 in | Wt 177.8 lb

## 2015-06-17 DIAGNOSIS — Z510 Encounter for antineoplastic radiation therapy: Secondary | ICD-10-CM | POA: Diagnosis not present

## 2015-06-17 DIAGNOSIS — C50311 Malignant neoplasm of lower-inner quadrant of right female breast: Secondary | ICD-10-CM

## 2015-06-17 NOTE — Progress Notes (Signed)
Radiation Oncology         (336) [phone] ________________________________  Name: Kara Meadows MRN: [id number]  Date: 06/17/2015  DOB: [birth year]  Follow-Up Visit Note  CC: Kara Oh, MD    ICD-9-CM ICD-10-CM   1. Breast cancer of lower-inner quadrant of right female breast 174.3 C50.311     Diagnosis: Kara Meadows is a 34 year old female presenting to clinic with breast cancer of lower-inner quadrant of right female breast.    Narrative:  The patient was initially seen in the multidisciplinary breast clinic. She proceeded to undergo partial mastectomy and sentinel node procedure. Surgery May 5 revealed patient have invasive ductal carcinoma. 2 sentinel lymph nodes were recovered none  of which showed metastasis. Tumor was 2.1 cm in size. Surgical margins were extremely close and therefore the patient underwent reexcision on June 9. Reexcision of several margins revealed no additional malignancy.  The patient returns today for follow-up post excision surgery. No issues with swelling, mobility or pain. No pain in the breast or nipple discharge.                               ALLERGIES:  is allergic to amoxicillin.  Meds: Current Outpatient Prescriptions  Medication Sig Dispense Refill  . FLUoxetine (PROZAC) 10 MG tablet Take 30 mg by mouth daily.     Marland Kitchen ibuprofen (ADVIL,MOTRIN) 400 MG tablet Take 400 mg by mouth every 8 (eight) hours as needed. Maybe once a week    . Levonorgestrel 13.5 MG IUD by Intrauterine route.    Marland Kitchen HYDROcodone-acetaminophen (NORCO) 5-325 MG per tablet Take 1 tablet by mouth every 6 (six) hours as needed for moderate pain. (Patient not taking: Reported on 06/17/2015) 30 tablet 0  . tamoxifen (NOLVADEX) 20 MG tablet Take 1 tablet (20 mg total) by mouth daily. Start September 1st (Patient not taking: Reported on 06/17/2015) 90 tablet 3   No current facility-administered medications for this encounter.    Physical Findings: The patient  is in no acute distress. Patient is alert and oriented.  height is 5\' 5"  (1.651 m) and weight is 177 lb 12.8 oz (80.65 kg). Her oral temperature is 98.1 F (36.7 C). Her blood pressure is 119/77 and her pulse is 63. Her respiration is 16. .  No significant changes.   Right Breast: CurviLinear scar on the lowerkk  areola border from excision surgery No palpable cervical, supraclavicular or axillary lymphoadenopathy General: Well-developed, in no acute distress HEENT: Normocephalic, atraumatic Cardiovascular: Regular rate and rhythm Respiratory: Clear to auscultation bilaterally GI: Soft, nontender, normal bowel sounds Extremities: No edema present   Lab Findings: Lab Results  Component Value Date   WBC 5.4 04/15/2015   HGB 13.6 06/04/2015   HCT 39.8 04/15/2015   MCV 88.9 04/15/2015   PLT 185 04/15/2015    Radiographic Findings: No results found.  Impression:  The patient is recovering from June 9th excision surgery . Reexcision have revealed clear margins. Good candidate for breast conservation.   Plan: We discussed the possible side effects and risks of treatment in addition to the possible benefits of treatment. We discussed the protocol for radiation treatment.  All of the patient's questions were answered. The patient does wish to proceed with this treatment. A simulation will be scheduled such that we can proceed with treatment planning.   -----------------------------------  Kara Promise, PhD, MD  This document serves as a record of  services personally performed by Gery Pray, MD. It was created on his behalf by Derek Mound, a trained medical scribe. The creation of this record is based on the scribe's personal observations and the provider's statements to them. This document has been checked and approved by the attending provider.

## 2015-06-17 NOTE — Progress Notes (Signed)
Please see the Nurse Progress Note in the MD Initial Consult Encounter for this patient. 

## 2015-06-19 ENCOUNTER — Ambulatory Visit
Admit: 2015-06-19 | Discharge: 2015-06-19 | Payer: PRIVATE HEALTH INSURANCE | Attending: Infectious Disease | Primary: Internal Medicine

## 2015-06-19 DIAGNOSIS — B182 Chronic viral hepatitis C: Secondary | ICD-10-CM

## 2015-06-19 NOTE — Progress Notes (Signed)
Subjective:      Patient ID: Alyssa Freeman is a 34 y.o. female.    HPI Comments: Alyssa Freeman returned for follow-up. She is a genotype 1a/1b baseline viral load about 5.8 logs treatment naive and F1 disease by [employer] testing. Currently she has completed 8 weeks of Zepatier and been tolerating very well without problems. Labs reviewed and discussed, noted HCV RNA non-detectable (reflective about 7 weeks of therapy). Anticipate patient to complete 12 weeks of medication and follow-up with me for EOT visit and labs. Plan discussed in details with patient and verbalized understanding.      Review of Systems   Constitutional: Positive for fatigue (a little). Negative for activity change, appetite change, chills, diaphoresis, fever and unexpected weight change.   HENT: Negative.  Negative for congestion, facial swelling and trouble swallowing.    Eyes: Negative.  Negative for photophobia and discharge.   Respiratory: Negative.  Negative for cough, chest tightness, shortness of breath and wheezing.    Cardiovascular: Negative.  Negative for chest pain, palpitations and leg swelling.   Gastrointestinal: Positive for diarrhea (daily after taking Zepatier). Negative for abdominal pain, constipation, nausea and vomiting.   Endocrine: Negative.  Negative for cold intolerance and heat intolerance.   Genitourinary: Negative.  Negative for dysuria, flank pain, frequency and hematuria.   Musculoskeletal: Negative.  Negative for arthralgias, back pain, joint swelling and myalgias.   Skin: Negative.  Negative for color change and rash.        Hair changes   Allergic/Immunologic: Negative.    Neurological: Negative.  Negative for dizziness, syncope, weakness, numbness and headaches.   Hematological: Negative.    Psychiatric/Behavioral: Negative.  Negative for agitation and sleep disturbance. The patient is not nervous/anxious.        Objective:   Physical Exam   Constitutional: She is oriented to person, place, and time. She appears  well-developed and well-nourished. No distress.   Mildly overweight     HENT:   Head: Normocephalic and atraumatic.   Nose: Nose normal.   Mouth/Throat: Oropharynx is clear and moist. No oropharyngeal exudate.   Eyes: Conjunctivae and EOM are normal. Pupils are equal, round, and reactive to light. Right eye exhibits no discharge. Left eye exhibits no discharge. No scleral icterus.   Neck: Normal range of motion. Neck supple. No thyromegaly present.   Cardiovascular: Normal rate, regular rhythm and normal heart sounds.    No murmur heard.  Pulmonary/Chest: Breath sounds normal. No respiratory distress. She has no wheezes. She has no rales.   Abdominal: Soft. Bowel sounds are normal. She exhibits no distension and no mass. There is no tenderness. There is no rebound.   Musculoskeletal: She exhibits no edema or tenderness.   Lymphadenopathy:     She has no cervical adenopathy.   Neurological: She is alert and oriented to person, place, and time. No cranial nerve deficit.   Skin: Skin is warm and dry. No rash noted. She is not diaphoretic. No erythema.   Psychiatric: She has a normal mood and affect. Thought content normal.       Assessment:      1. Chronic hepatitis C without hepatic coma (HCC)  Hepatitis A hepatitis B combined vaccine IM           Plan:      34 years-old female with HCV genotype 1a/1b baseline viral load about 5.8 logs treatment naive and F1 disease by [employer] testing.    1. Continue Zepatier  2. Check labs in about  4 weeks and see me for EOT visit.  Plan discussed in details with patient.  Spent >51% of time discussing, counseling and coordinating care for patient. Time spent 15 minutes.  Discussed with the patient and all questioned fully answered. She will call me if any problems arise.      Simone Curia DO FACP FIDSA FASCP

## 2015-06-22 ENCOUNTER — Telehealth: Payer: Self-pay

## 2015-06-22 NOTE — Telephone Encounter (Signed)
Patient safety and health consideration rcvd from [employer] dtd 06/02/15.  Reviewed by Dr. Lindi Adie, Sent to scan.

## 2015-06-26 ENCOUNTER — Encounter: Attending: Internal Medicine | Primary: Internal Medicine

## 2015-06-30 ENCOUNTER — Ambulatory Visit
Admission: RE | Admit: 2015-06-30 | Discharge: 2015-06-30 | Disposition: A | Discharge status: Home / Self Care | Payer: BC Managed Care – PPO | Source: Ambulatory Visit | Attending: Radiation Oncology | Admitting: Radiation Oncology

## 2015-06-30 ENCOUNTER — Encounter

## 2015-06-30 DIAGNOSIS — Z510 Encounter for antineoplastic radiation therapy: Secondary | ICD-10-CM | POA: Diagnosis not present

## 2015-06-30 DIAGNOSIS — C50311 Malignant neoplasm of lower-inner quadrant of right female breast: Secondary | ICD-10-CM

## 2015-06-30 MED ORDER — SERTRALINE HCL 100 MG PO TABS
100 MG | ORAL_TABLET | ORAL | 11 refills | Status: DC
Start: 2015-06-30 — End: 2015-07-02

## 2015-06-30 NOTE — Progress Notes (Signed)
  Radiation Oncology         [phone] ________________________________  Name: Kara Meadows MRN: [id number]  Date: 06/30/2015  DOB: Jul 27, 1981  SIMULATION AND TREATMENT PLANNING NOTE    ICD-9-CM ICD-10-CM   1. Breast cancer of lower-inner quadrant of right female breast 174.3 C50.311     DIAGNOSIS:     ICD-9-CM ICD-10-CM   1. Breast cancer of lower-inner quadrant of right female breast 174.3 C50.311           NARRATIVE:  The patient was brought to the [facility].  Identity was confirmed.  All relevant records and images related to the planned course of therapy were reviewed.  The patient freely provided informed written consent to proceed with treatment after reviewing the details related to the planned course of therapy. The consent form was witnessed and verified by the simulation staff.  Then, the patient was set-up in a stable reproducible  supine position for radiation therapy.  CT images were obtained.  Surface markings were placed.  The CT images were loaded into the planning software.  Then the target and avoidance structures were contoured.  Treatment planning then occurred.  The radiation prescription was entered and confirmed.  Then, I designed and supervised the construction of a total of 3 medically necessary complex treatment devices.  I have requested : 3D Simulation  I have requested a DVH of the following structures: Right breast, lumpectomy cavity, lungs, heart.  I have ordered:dose calc.  PLAN:  The patient will receive 50.4 Gy in 28 fractions followed by a boost to the lumpectomy cavity of 10 gray and a cumulative dose to the lumpectomy cavity of 60.4 gray  ________________________________  -----------------------------------  Blair Promise, PhD, MD

## 2015-07-01 ENCOUNTER — Encounter: Payer: Self-pay | Admitting: Radiation Oncology

## 2015-07-01 NOTE — Progress Notes (Signed)
Received FMLA paperwork that has been given to the nurse to fill out.

## 2015-07-02 ENCOUNTER — Ambulatory Visit
Admit: 2015-07-02 | Discharge: 2015-07-02 | Payer: PRIVATE HEALTH INSURANCE | Attending: Internal Medicine | Primary: Internal Medicine

## 2015-07-02 DIAGNOSIS — F1920 Other psychoactive substance dependence, uncomplicated: Secondary | ICD-10-CM

## 2015-07-02 NOTE — Progress Notes (Signed)
Subjective:  Alyssa Freeman is a 34 y.o. 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 sertraline (ZOLOFT) 100 MG tablet TAKE 1 AND 1/2 TABLETS BY MOUTH EVERY DAY 45 tablet 11   . Elbasvir-Grazoprevir 50-100 MG TABS Take 1 tablet by mouth daily     . traZODone (DESYREL) 100 MG tablet Take 0.5 tablets by mouth nightly 30 tablet 2   . omeprazole (PRILOSEC) 20 MG capsule Take 20 mg by mouth once     . etonogestrel (NEXPLANON) 68 MG implant Inject 68 mg into the skin once Indications: Left Arm Implant       No current facility-administered medications for this visit.          Past Medical History   Diagnosis Date   . Depression    . GERD (gastroesophageal reflux disease)    . Hypertension    . Liver disease      HCV GT 1a/1b   . Rash    . Sleep disorder      family history includes Depression in her mother; High Blood Pressure in her father and mother.   reports that she has been smoking Cigarettes.  She has a 10.00 pack-year smoking history. She has never used smokeless tobacco.    Review of Systems   Constitutional: Negative for chills and fever.   Respiratory: Negative for cough and shortness of breath.    Cardiovascular: Negative for chest pain and palpitations.   Gastrointestinal: Negative for diarrhea, nausea and vomiting.       Objective:    Vitals:    07/02/15 1457   BP: 126/70   Site: Right Arm   Position: Sitting   Weight: 187 lb (84.8 kg)   Height: 5\' 2"  (1.575 m)     Physical Exam   Constitutional: She appears well-developed and well-nourished.   HENT:   Head: Normocephalic and atraumatic.   Mouth/Throat: No oropharyngeal exudate.   Neck: Normal range of motion.   Cardiovascular: Normal  rate, regular rhythm and normal heart sounds.  Exam reveals no gallop and no friction rub.    No murmur heard.  Pulmonary/Chest: Effort normal. No respiratory distress. She has no wheezes. She has no rales.   Abdominal: Soft.       ASSESSMENT/PLAN:    1. Drug dependence, episodic use (HCC)    2. Hepatitis C virus infection without hepatic coma, unspecified chronicity    3. Depression, unspecified depression type          Medications Discontinued During This Encounter   Medication Reason   . desoximetasone (TOPICORT) 0.05 % cream    . sertraline (ZOLOFT) 100 MG tablet          FOLLOWUP      Return in about 6 months (around 01/02/2016).    Electronically signed by Tommy MedalMatthew Tonya Wantz, MD on 07/02/15 at 3:01 PM

## 2015-07-02 NOTE — Addendum Note (Signed)
Encounter addended by: Gery Pray, MD on: 07/02/2015  7:19 PM<BR>     Documentation filed: Notes Section

## 2015-07-02 NOTE — Progress Notes (Signed)
  Radiation Oncology         [phone] ________________________________  Name: Kara Meadows MRN: [id number]  Date: 06/30/2015  DOB: 05-05-1981  Optical Surface Tracking Plan:  Since intensity modulated radiotherapy (IMRT) and 3D conformal radiation treatment methods are predicated on accurate and precise positioning for treatment, intrafraction motion monitoring is medically necessary to ensure accurate and safe treatment delivery.  The ability to quantify intrafraction motion without excessive ionizing radiation dose can only be performed with optical surface tracking. Accordingly, surface imaging offers the opportunity to obtain 3D measurements of patient position throughout IMRT and 3D treatments without excessive radiation exposure.  I am ordering optical surface tracking for this patient's upcoming course of radiotherapy. ________________________________  Gery Pray, MD 07/02/2015 7:18 PM    Reference:   Particia Jasper, et al. Surface imaging-based analysis of intrafraction motion for breast radiotherapy patients.Journal of [place], n. 6, nov. 2014. ISSN [id number].   Available at: <http://www.jacmp.org/index.php/jacmp/article/view/4957>.

## 2015-07-03 LAB — COMPREHENSIVE METABOLIC PANEL
ALT: 10 U/L — ABNORMAL LOW (ref 12–78)
AST: 11 U/L — ABNORMAL LOW (ref 15–37)
Albumin,Serum: 3.7 g/dL (ref 3.4–5.0)
Alkaline Phosphatase: 84 U/L (ref 45–117)
Anion Gap: 9 NA
BUN: 7 mg/dL (ref 7–25)
CO2: 23 mmol/L (ref 21–32)
Calcium: 9.1 mg/dL (ref 8.2–10.1)
Chloride: 107 mmol/L (ref 98–109)
Creatinine: 0.78 mg/dL (ref 0.55–1.40)
EGFR IF NonAfrican American: >60 mL/min (ref >60)
Glucose: 89 mg/dL (ref 70–100)
Potassium: 3.9 mmol/L (ref 3.5–5.1)
Sodium: 139 mmol/L (ref 135–145)
Total Bilirubin: 0.3 mg/dL (ref 0.2–1.0)
Total Protein: 7.2 g/dL (ref 6.4–8.2)
eGFR African American: >60 mL/min (ref >60)

## 2015-07-03 LAB — LIPID PANEL
Chol/HDL Ratio: 5 NA
Cholesterol: 250 mg/dL — AB (ref <200)
HDL: 51 mg/dL (ref 40–59)
LDL Cholesterol: 181 mg/dL — AB (ref <100)
Triglycerides: 88 mg/dL (ref <150)

## 2015-07-07 ENCOUNTER — Ambulatory Visit: Payer: BC Managed Care – PPO | Admitting: Radiation Oncology

## 2015-07-09 ENCOUNTER — Encounter: Payer: Self-pay | Admitting: Radiation Oncology

## 2015-07-09 DIAGNOSIS — Z510 Encounter for antineoplastic radiation therapy: Secondary | ICD-10-CM | POA: Diagnosis not present

## 2015-07-09 NOTE — Progress Notes (Signed)
Faxed completed FMLA paperwork to patient at [phone]

## 2015-07-13 ENCOUNTER — Ambulatory Visit
Admission: RE | Admit: 2015-07-13 | Discharge: 2015-07-13 | Disposition: A | Discharge status: Home / Self Care | Payer: BC Managed Care – PPO | Source: Ambulatory Visit | Attending: Radiation Oncology | Admitting: Radiation Oncology

## 2015-07-13 DIAGNOSIS — Z510 Encounter for antineoplastic radiation therapy: Secondary | ICD-10-CM | POA: Diagnosis not present

## 2015-07-13 DIAGNOSIS — C50311 Malignant neoplasm of lower-inner quadrant of right female breast: Secondary | ICD-10-CM

## 2015-07-13 MED ORDER — ALRA NON-METALLIC DEODORANT (RAD-ONC)
1.0000 "application " | Freq: Once | TOPICAL | Status: AC
  Administered 2015-07-13: 1 via TOPICAL

## 2015-07-13 MED ORDER — RADIAPLEXRX EX GEL
Freq: Once | CUTANEOUS | Status: AC
  Administered 2015-07-13: 17:00:00 via TOPICAL

## 2015-07-13 NOTE — Progress Notes (Signed)
Pt here for patient teaching.  Pt given Radiation and You booklet, skin care instructions, Alra deodorant and Radiaplex gel.  Reviewed areas of pertinence such as fatigue, skin changes, breast tenderness and breast swelling . Pt able to give teach back of to pat skin and use unscented/gentle soap,apply Radiaplex bid, avoid applying anything to skin within 4 hours of treatment and to use an electric razor if they must shave. Pt demonstrated understanding and verbalizes understanding of information given and will contact nursing with any questions or concerns.          

## 2015-07-14 ENCOUNTER — Ambulatory Visit
Admission: RE | Admit: 2015-07-14 | Discharge: 2015-07-14 | Disposition: A | Discharge status: Home / Self Care | Payer: BC Managed Care – PPO | Source: Ambulatory Visit | Attending: Radiation Oncology | Admitting: Radiation Oncology

## 2015-07-14 ENCOUNTER — Telehealth: Payer: Self-pay | Admitting: *Deleted

## 2015-07-14 ENCOUNTER — Encounter: Payer: Self-pay | Admitting: Radiation Oncology

## 2015-07-14 VITALS — BP 113/78 | HR 90 | Temp 98.7°F | Resp 20 | Wt 174.3 lb

## 2015-07-14 DIAGNOSIS — C50311 Malignant neoplasm of lower-inner quadrant of right female breast: Secondary | ICD-10-CM

## 2015-07-14 DIAGNOSIS — Z510 Encounter for antineoplastic radiation therapy: Secondary | ICD-10-CM | POA: Diagnosis not present

## 2015-07-14 NOTE — Telephone Encounter (Signed)
Spoke to pt concerning 1st xrt. Denies needs. Relate doing well and without complaints. Encourage pt to call with questions or concerns. Received verbal understanding.

## 2015-07-14 NOTE — Progress Notes (Signed)
Weekly rad txs right breast, 1st tx/33 completed, no skin changes, starting radiaplex gel today, no pain, 8:27 AM BP 113/78 mmHg  Pulse 90  Temp(Src) 98.7 F (37.1 C) (Oral)  Resp 20  Wt 174 lb 4.8 oz (79.062 kg)  Wt Readings from Last 3 Encounters:  07/14/15 174 lb 4.8 oz (79.062 kg)  06/17/15 177 lb 12.8 oz (80.65 kg)  06/04/15 183 lb (83.008 kg)

## 2015-07-14 NOTE — Progress Notes (Signed)
  Radiation Oncology         (336) [phone] ________________________________  Name: Kara Meadows MRN: [id number]  Date: 07/14/2015  DOB: 1981/06/19  Weekly Radiation Therapy Management  DIAGNOSIS:    ICD-9-CM ICD-10-CM   1. Breast cancer of lower-inner quadrant of right female breast 174.3 C50.311              Current Dose: 1.8 Gy     Planned Dose:  60.4 Gy  Narrative . . . . . . . . The patient presents for routine under treatment assessment.                                   The patient is without complaint. Pleased with first treatment today.                                 Set-up films were reviewed.                                 The chart was checked. Physical Findings. . .  weight is 174 lb 4.8 oz (79.062 kg). Her oral temperature is 98.7 F (37.1 C). Her blood pressure is 113/78 and her pulse is 90. Her respiration is 20. . The lungs are clear. The heart has a regular rhythm and rate. the right breast is well-healed without radiation reaction at this point. Impression . . . . . . . The patient is tolerating radiation. Plan . . . . . . . . . . . . Continue treatment as planned.  ________________________________   Blair Promise, PhD, MD

## 2015-07-15 ENCOUNTER — Ambulatory Visit
Admission: RE | Admit: 2015-07-15 | Discharge: 2015-07-15 | Disposition: A | Discharge status: Home / Self Care | Payer: BC Managed Care – PPO | Source: Ambulatory Visit | Attending: Radiation Oncology | Admitting: Radiation Oncology

## 2015-07-15 DIAGNOSIS — Z510 Encounter for antineoplastic radiation therapy: Secondary | ICD-10-CM | POA: Diagnosis not present

## 2015-07-16 ENCOUNTER — Ambulatory Visit
Admission: RE | Admit: 2015-07-16 | Discharge: 2015-07-16 | Disposition: A | Discharge status: Home / Self Care | Payer: BC Managed Care – PPO | Source: Ambulatory Visit | Attending: Radiation Oncology | Admitting: Radiation Oncology

## 2015-07-16 DIAGNOSIS — Z510 Encounter for antineoplastic radiation therapy: Secondary | ICD-10-CM | POA: Diagnosis not present

## 2015-07-17 ENCOUNTER — Ambulatory Visit
Admission: RE | Admit: 2015-07-17 | Discharge: 2015-07-17 | Disposition: A | Discharge status: Home / Self Care | Payer: BC Managed Care – PPO | Source: Ambulatory Visit | Attending: Radiation Oncology | Admitting: Radiation Oncology

## 2015-07-17 DIAGNOSIS — Z510 Encounter for antineoplastic radiation therapy: Secondary | ICD-10-CM | POA: Diagnosis not present

## 2015-07-20 ENCOUNTER — Ambulatory Visit
Admission: RE | Admit: 2015-07-20 | Discharge: 2015-07-20 | Disposition: A | Discharge status: Home / Self Care | Payer: BC Managed Care – PPO | Source: Ambulatory Visit | Attending: Radiation Oncology | Admitting: Radiation Oncology

## 2015-07-20 DIAGNOSIS — Z510 Encounter for antineoplastic radiation therapy: Secondary | ICD-10-CM | POA: Diagnosis not present

## 2015-07-21 ENCOUNTER — Ambulatory Visit
Admission: RE | Admit: 2015-07-21 | Discharge: 2015-07-21 | Disposition: A | Discharge status: Home / Self Care | Payer: BC Managed Care – PPO | Source: Ambulatory Visit | Attending: Radiation Oncology | Admitting: Radiation Oncology

## 2015-07-21 ENCOUNTER — Ambulatory Visit: Payer: BC Managed Care – PPO | Admitting: Radiation Oncology

## 2015-07-21 DIAGNOSIS — Z510 Encounter for antineoplastic radiation therapy: Secondary | ICD-10-CM | POA: Diagnosis not present

## 2015-07-22 ENCOUNTER — Encounter: Payer: Self-pay | Admitting: Radiation Oncology

## 2015-07-22 ENCOUNTER — Ambulatory Visit
Admission: RE | Admit: 2015-07-22 | Discharge: 2015-07-22 | Disposition: A | Discharge status: Home / Self Care | Payer: BC Managed Care – PPO | Source: Ambulatory Visit | Attending: Radiation Oncology | Admitting: Radiation Oncology

## 2015-07-22 VITALS — BP 115/76 | HR 65 | Temp 98.4°F | Resp 16 | Wt 176.6 lb

## 2015-07-22 DIAGNOSIS — C50311 Malignant neoplasm of lower-inner quadrant of right female breast: Secondary | ICD-10-CM

## 2015-07-22 DIAGNOSIS — Z510 Encounter for antineoplastic radiation therapy: Secondary | ICD-10-CM | POA: Diagnosis not present

## 2015-07-22 NOTE — Progress Notes (Signed)
  Radiation Oncology         (336) [phone] ________________________________  Name: Kara Meadows MRN: [id number]  Date: 07/22/2015  DOB: 11/19/1981  Weekly Radiation Therapy Management  DIAGNOSIS:    ICD-9-CM ICD-10-CM   1. Breast cancer of lower-inner quadrant of right female breast 174.3 C50.311              Current Dose: 12.6 Gy     Planned Dose:  60.4 Gy  Narrative: The patient presents for routine under treatment assessment. . She is continuing to use prescribed Radiaplex as needed. The patient stated that her appetite and energy levels are currently good. She stated that she is experiencing tenderness in the treated breast. The chart was checked and set-up films were reviewed.   Physical Findings:  weight is 176 lb 9.6 oz (80.105 kg). Her oral temperature is 98.4 F (36.9 C). Her blood pressure is 115/76 and her pulse is 65. Her respiration is 16. Marland Kitchen Her lungs are clear, the heart has a regular rhythm and rate.  She has mild erythema with slight swelling, but her skin is intact.  Impression: Kara Meadows is a 34 year old female presenting to clinic in regards to her cancer of the right female breast. The patient is tolerating radiation.  Plan: The patient was advised to continue treatment as planned and use of Radiaplex as needed. If she develops any further questions or concerns, she was advised to contact Dr. Earney Hamburg, MD, PhD.  This document serves as a record of services personally performed by Gery Pray, MD. It was created on his behalf by Lenn Cal, a trained medical scribe. The creation of this record is based on the scribe's personal observations and the provider's statements to them. This document has been checked and approved by the attending provider.    ________________________________   Blair Promise, PhD, MD

## 2015-07-22 NOTE — Progress Notes (Signed)
Weekly right breast  7/33 completed, mild erythema,skin intact, tenderness  Slight swelling, using radiaplex bid, appetite good,BP 115/76 mmHg  Pulse 65  Temp(Src) 98.4 F (36.9 C) (Oral)  Resp 16  Wt 176 lb 9.6 oz (80.105 kg)  Wt Readings from Last 3 Encounters:  07/22/15 176 lb 9.6 oz (80.105 kg)  07/14/15 174 lb 4.8 oz (79.062 kg)  06/17/15 177 lb 12.8 oz (80.65 kg)   8:35 AM

## 2015-07-23 ENCOUNTER — Ambulatory Visit
Admission: RE | Admit: 2015-07-23 | Discharge: 2015-07-23 | Disposition: A | Discharge status: Home / Self Care | Payer: BC Managed Care – PPO | Source: Ambulatory Visit | Attending: Radiation Oncology | Admitting: Radiation Oncology

## 2015-07-23 DIAGNOSIS — Z510 Encounter for antineoplastic radiation therapy: Secondary | ICD-10-CM | POA: Diagnosis not present

## 2015-07-24 ENCOUNTER — Ambulatory Visit
Admission: RE | Admit: 2015-07-24 | Discharge: 2015-07-24 | Disposition: A | Discharge status: Home / Self Care | Payer: BC Managed Care – PPO | Source: Ambulatory Visit | Attending: Radiation Oncology | Admitting: Radiation Oncology

## 2015-07-24 ENCOUNTER — Encounter: Attending: Infectious Disease | Primary: Internal Medicine

## 2015-07-24 DIAGNOSIS — Z510 Encounter for antineoplastic radiation therapy: Secondary | ICD-10-CM | POA: Diagnosis not present

## 2015-07-27 ENCOUNTER — Ambulatory Visit
Admission: RE | Admit: 2015-07-27 | Discharge: 2015-07-27 | Disposition: A | Discharge status: Home / Self Care | Payer: BC Managed Care – PPO | Source: Ambulatory Visit | Attending: Radiation Oncology | Admitting: Radiation Oncology

## 2015-07-27 DIAGNOSIS — Z510 Encounter for antineoplastic radiation therapy: Secondary | ICD-10-CM | POA: Diagnosis not present

## 2015-07-28 ENCOUNTER — Ambulatory Visit
Admission: RE | Admit: 2015-07-28 | Discharge: 2015-07-28 | Disposition: A | Discharge status: Home / Self Care | Payer: BC Managed Care – PPO | Source: Ambulatory Visit | Attending: Radiation Oncology | Admitting: Radiation Oncology

## 2015-07-28 ENCOUNTER — Encounter: Payer: Self-pay | Admitting: Radiation Oncology

## 2015-07-28 ENCOUNTER — Ambulatory Visit
Admission: RE | Admit: 2015-07-28 | Discharge: 2015-07-28 | Disposition: A | Discharge status: Home / Self Care | Payer: BC Managed Care – PPO | Source: Ambulatory Visit | Attending: Family Medicine | Admitting: Family Medicine

## 2015-07-28 VITALS — BP 120/71 | HR 64 | Temp 98.5°F | Resp 16 | Ht 65.0 in | Wt 174.5 lb

## 2015-07-28 DIAGNOSIS — C50311 Malignant neoplasm of lower-inner quadrant of right female breast: Secondary | ICD-10-CM

## 2015-07-28 DIAGNOSIS — Z510 Encounter for antineoplastic radiation therapy: Secondary | ICD-10-CM | POA: Diagnosis not present

## 2015-07-28 DIAGNOSIS — E041 Nontoxic single thyroid nodule: Secondary | ICD-10-CM

## 2015-07-28 NOTE — Progress Notes (Signed)
Weekly Management Note Current Dose:  21.6 Gy  Projected Dose: 60.4 Gy   Narrative:  The patient presents for routine under treatment assessment.  CBCT/MVCT images/Port film x-rays were reviewed.  The chart was checked. Right breast tenderness. Reports using radiaplex as directed. States she is actively trying to lose weight.   Physical Findings: Weight: 174 lb 8 oz (79.153 kg). Right breast is slightly pink.  Impression:  The patient is tolerating radiation.  Plan: Continue treatment as planned. I advised her to continue using the radiaplex. I complimented her on her weight loss.  This document serves as a record of services personally performed by Thea Silversmith, MD. It was created on her behalf by Darcus Austin, a trained medical scribe. The creation of this record is based on the scribe's personal observations and the provider's statements to them. This document has been checked and approved by the attending provider.

## 2015-07-28 NOTE — Progress Notes (Signed)
Kara Meadows has completed 11 fractions to her right breast.  She denies pain but does report tenderness in her right breast.  She denies fatigue.  The skin on her right breast is red.  She is using radiaplex gel.    BP 120/71 mmHg  Pulse 64  Temp(Src) 98.5 F (36.9 C) (Oral)  Resp 16  Ht 5\' 5"  (1.651 m)  Wt 174 lb 8 oz (79.153 kg)  BMI 29.04 kg/m2

## 2015-07-29 ENCOUNTER — Ambulatory Visit
Admission: RE | Admit: 2015-07-29 | Discharge: 2015-07-29 | Disposition: A | Discharge status: Home / Self Care | Payer: BC Managed Care – PPO | Source: Ambulatory Visit | Attending: Radiation Oncology | Admitting: Radiation Oncology

## 2015-07-29 DIAGNOSIS — Z510 Encounter for antineoplastic radiation therapy: Secondary | ICD-10-CM | POA: Diagnosis not present

## 2015-07-30 ENCOUNTER — Ambulatory Visit
Admission: RE | Admit: 2015-07-30 | Discharge: 2015-07-30 | Disposition: A | Discharge status: Home / Self Care | Payer: BC Managed Care – PPO | Source: Ambulatory Visit | Attending: Radiation Oncology | Admitting: Radiation Oncology

## 2015-07-30 DIAGNOSIS — Z510 Encounter for antineoplastic radiation therapy: Secondary | ICD-10-CM | POA: Diagnosis not present

## 2015-07-31 ENCOUNTER — Ambulatory Visit
Admission: RE | Admit: 2015-07-31 | Discharge: 2015-07-31 | Disposition: A | Discharge status: Home / Self Care | Payer: BC Managed Care – PPO | Source: Ambulatory Visit | Attending: Radiation Oncology | Admitting: Radiation Oncology

## 2015-07-31 DIAGNOSIS — Z510 Encounter for antineoplastic radiation therapy: Secondary | ICD-10-CM | POA: Diagnosis not present

## 2015-08-03 ENCOUNTER — Ambulatory Visit
Admission: RE | Admit: 2015-08-03 | Discharge: 2015-08-03 | Disposition: A | Discharge status: Home / Self Care | Payer: BC Managed Care – PPO | Source: Ambulatory Visit | Attending: Radiation Oncology | Admitting: Radiation Oncology

## 2015-08-03 DIAGNOSIS — Z510 Encounter for antineoplastic radiation therapy: Secondary | ICD-10-CM | POA: Diagnosis not present

## 2015-08-03 LAB — COMPREHENSIVE METABOLIC PANEL
ALT: 10 U/L — ABNORMAL LOW (ref 12–78)
AST: 16 U/L (ref 15–37)
Albumin,Serum: 3.9 g/dL (ref 3.4–5.0)
Alkaline Phosphatase: 80 U/L (ref 45–117)
Anion Gap: 10 NA
BUN: 10 mg/dL (ref 7–25)
CO2: 23 mmol/L (ref 21–32)
Calcium: 9.4 mg/dL (ref 8.2–10.1)
Chloride: 106 mmol/L (ref 98–109)
Creatinine: 0.68 mg/dL (ref 0.55–1.40)
EGFR IF NonAfrican American: >60 mL/min (ref >60)
Glucose: 75 mg/dL (ref 70–100)
Potassium: 4.1 mmol/L (ref 3.5–5.1)
Sodium: 139 mmol/L (ref 135–145)
Total Bilirubin: 0.4 mg/dL (ref 0.2–1.0)
Total Protein: 7.4 g/dL (ref 6.4–8.2)
eGFR African American: >60 mL/min (ref >60)

## 2015-08-03 LAB — CBC WITH AUTO DIFFERENTIAL
Absolute Baso #: 0 10*3/uL (ref 0.0–0.2)
Absolute Eos #: 0.1 10*3/uL (ref 0.0–0.5)
Absolute Lymph #: 1.9 10*3/uL (ref 1.0–4.3)
Absolute Mono #: 0.7 10*3/uL (ref 0.0–0.8)
Absolute Neut #: 8 10*3/uL — ABNORMAL HIGH (ref 1.8–7.0)
Basophils: 0.4 %
Eosinophils: 1.3 %
Granulocytes %: 73.9 %
Hematocrit: 43.6 % (ref 35.0–47.0)
Hemoglobin: 14.2 g/dL (ref 11.7–16.0)
Lymphocyte %: 18 %
MCH: 28.6 pg (ref 26.0–34.0)
MCHC: 32.6 % (ref 32.0–36.0)
MCV: 87.8 fL (ref 79.0–98.0)
MPV: 8.7 fL (ref 7.4–10.4)
Monocytes: 6.4 %
Platelets: 284 10*3/uL (ref 140–440)
RBC: 4.97 10*6/uL (ref 3.80–5.20)
RDW: 14.2 % (ref 11.5–14.5)
WBC: 10.8 10*3/uL — ABNORMAL HIGH (ref 3.6–10.7)

## 2015-08-04 ENCOUNTER — Ambulatory Visit
Admission: RE | Admit: 2015-08-04 | Discharge: 2015-08-04 | Disposition: A | Discharge status: Home / Self Care | Payer: BC Managed Care – PPO | Source: Ambulatory Visit | Attending: Radiation Oncology | Admitting: Radiation Oncology

## 2015-08-04 ENCOUNTER — Encounter: Payer: Self-pay | Admitting: Radiation Oncology

## 2015-08-04 VITALS — BP 108/66 | HR 65 | Temp 98.3°F | Resp 16 | Ht 65.0 in | Wt 175.5 lb

## 2015-08-04 DIAGNOSIS — C50311 Malignant neoplasm of lower-inner quadrant of right female breast: Secondary | ICD-10-CM | POA: Insufficient documentation

## 2015-08-04 DIAGNOSIS — Z510 Encounter for antineoplastic radiation therapy: Secondary | ICD-10-CM | POA: Diagnosis not present

## 2015-08-04 MED ORDER — RADIAPLEXRX EX GEL
Freq: Once | CUTANEOUS | Status: AC
  Administered 2015-08-04: 09:00:00 via TOPICAL

## 2015-08-04 NOTE — Progress Notes (Signed)
  Radiation Oncology         (336) [phone] ________________________________  Name: Kara Meadows MRN: [id number]  Date: 08/04/2015  DOB: Apr 04, 1981  Weekly Radiation Therapy Management  Breast cancer of lower-inner quadrant of right female breast   Staging form: Breast, AJCC 7th Edition     Clinical stage from 04/15/2015: Stage IA (T1c, N0, M0) - Unsigned     Pathologic stage from 05/05/2015: Stage IIA (T2, N0, cM0) - Signed by Enid Cutter, MD on 05/12/2015       Staging comments: Staged on final lumpectomy specimen by Dr. Donato Heinz    Current Dose: 28.8 Gy     Planned Dose:  60.4 Gy  Narrative . . . . . . . . The patient presents for routine under treatment assessment.                                   The patient is without complaint except for some mild fatigue. No itching or discomfort within the breast.                                 Set-up films were reviewed.                                 The chart was checked. Physical Findings. . .  height is 5\' 5"  (1.651 m) and weight is 175 lb 8 oz (79.606 kg). Her oral temperature is 98.3 F (36.8 C). Her blood pressure is 108/66 and her pulse is 65. Her respiration is 16. . The lungs are clear. The heart has a regular rhythm and rate. The right breast area shows erythema and mild hyperpigmentation changes. No skin breakdown  Impression . . . . . . . The patient is tolerating radiation. Plan . . . . . . . . . . . . Continue treatment as planned.  ________________________________   Blair Promise, PhD, MD

## 2015-08-04 NOTE — Progress Notes (Signed)
Kara  Meadows has completed 16 fractions to her right breast.  She denies pain.  She reports fatigue that started last week.  The skin on her right breast is red.  She is using radiaplex gel and needs a refill.  Another tube has been given.  BP 108/66 mmHg  Pulse 65  Temp(Src) 98.3 F (36.8 C) (Oral)  Resp 16  Ht 5\' 5"  (1.651 m)  Wt 175 lb 8 oz (79.606 kg)  BMI 29.20 kg/m2

## 2015-08-05 ENCOUNTER — Encounter: Payer: Self-pay | Admitting: Radiation Oncology

## 2015-08-05 ENCOUNTER — Ambulatory Visit
Admission: RE | Admit: 2015-08-05 | Discharge: 2015-08-05 | Disposition: A | Discharge status: Home / Self Care | Payer: BC Managed Care – PPO | Source: Ambulatory Visit | Attending: Radiation Oncology | Admitting: Radiation Oncology

## 2015-08-05 DIAGNOSIS — Z510 Encounter for antineoplastic radiation therapy: Secondary | ICD-10-CM | POA: Diagnosis not present

## 2015-08-05 LAB — HEP C RNA, QUANT (VIRAL LOAD)
Hepatitis C RNA Quant Log: NOT DETECTED {Log_IU} (ref <1.18)
Hepatitis C RNA Quantitative: NOT DETECTED [IU]/mL (ref <15)

## 2015-08-05 NOTE — Progress Notes (Signed)
  Radiation Oncology         [phone] ________________________________  Name: Kara Meadows MRN: [id number]  Date: 08/05/2015  DOB: 1981-07-29  Electron beam Simulation Note  Status: outpatient  NARRATIVE: Earlier today the patient underwent additional planning for radiation therapy directed at the right breast area. The patient's treatment planning CT scan was reviewed and she had set up of a custom electron cutout field directed at the lumpectomy cavity within the right breast. The patient will be treated with an electron monte Carlo plan using 12 Me V electrons prescribed to the 100% isodose line.  Plan patient is to receive 5 additional treatments at 2 gray per fraction for a boost dose of 10 gray and a cumulative dose of 60.4 gray. A special port plan is requested for treatment.  -----------------------------------  Blair Promise, PhD, MD

## 2015-08-06 ENCOUNTER — Ambulatory Visit
Admission: RE | Admit: 2015-08-06 | Discharge: 2015-08-06 | Disposition: A | Discharge status: Home / Self Care | Payer: BC Managed Care – PPO | Source: Ambulatory Visit | Attending: Radiation Oncology | Admitting: Radiation Oncology

## 2015-08-06 DIAGNOSIS — Z510 Encounter for antineoplastic radiation therapy: Secondary | ICD-10-CM | POA: Diagnosis not present

## 2015-08-07 ENCOUNTER — Ambulatory Visit
Admission: RE | Admit: 2015-08-07 | Discharge: 2015-08-07 | Disposition: A | Discharge status: Home / Self Care | Payer: BC Managed Care – PPO | Source: Ambulatory Visit | Attending: Radiation Oncology | Admitting: Radiation Oncology

## 2015-08-07 DIAGNOSIS — Z510 Encounter for antineoplastic radiation therapy: Secondary | ICD-10-CM | POA: Diagnosis not present

## 2015-08-10 ENCOUNTER — Ambulatory Visit
Admission: RE | Admit: 2015-08-10 | Discharge: 2015-08-10 | Disposition: A | Discharge status: Home / Self Care | Payer: BC Managed Care – PPO | Source: Ambulatory Visit | Attending: Radiation Oncology | Admitting: Radiation Oncology

## 2015-08-10 DIAGNOSIS — Z510 Encounter for antineoplastic radiation therapy: Secondary | ICD-10-CM | POA: Diagnosis not present

## 2015-08-11 ENCOUNTER — Ambulatory Visit
Admission: RE | Admit: 2015-08-11 | Discharge: 2015-08-11 | Disposition: A | Discharge status: Home / Self Care | Payer: BC Managed Care – PPO | Source: Ambulatory Visit | Attending: Radiation Oncology | Admitting: Radiation Oncology

## 2015-08-11 ENCOUNTER — Encounter: Payer: Self-pay | Admitting: Radiation Oncology

## 2015-08-11 VITALS — BP 120/79 | HR 71 | Temp 98.4°F | Resp 16 | Ht 65.0 in | Wt 172.1 lb

## 2015-08-11 DIAGNOSIS — Z510 Encounter for antineoplastic radiation therapy: Secondary | ICD-10-CM | POA: Diagnosis not present

## 2015-08-11 DIAGNOSIS — C50311 Malignant neoplasm of lower-inner quadrant of right female breast: Secondary | ICD-10-CM

## 2015-08-11 NOTE — Progress Notes (Signed)
Kara Meadows has completed 21 fractions to her right breast.  She reports soreness and sensitivity in her right nipple area.  She reports fatigue and is having to take a nap in the afternoon.  The skin on her right breast is red.  She reports it did peel around her nipple area.  She is using radiaplex.  BP 120/79 mmHg  Pulse 71  Temp(Src) 98.4 F (36.9 C) (Oral)  Resp 16  Ht 5\' 5"  (1.651 m)  Wt 172 lb 1.6 oz (78.064 kg)  BMI 28.64 kg/m2

## 2015-08-11 NOTE — Progress Notes (Signed)
  Radiation Oncology         (336) [phone] ________________________________  Name: Kara Meadows MRN: [id number]  Date: 08/11/2015  DOB: [birth year]  Weekly Radiation Therapy Management    ICD-9-CM ICD-10-CM   1. Breast cancer of lower-inner quadrant of right female breast 174.3 C50.311     Current Dose: 37.8 Gy     Planned Dose:  60.4 Gy  Narrative . . . . . . . . The patient presents for routine under treatment assessment.                                   The patient reports soreness and sensitivity in her right nipple area. She reports fatigue and is having to take a nap in the afternoon.                                 Set-up films were reviewed.                                 The chart was checked. Physical Findings. . .  height is 5\' 5"  (1.651 m) and weight is 172 lb 1.6 oz (78.064 kg). Her oral temperature is 98.4 F (36.9 C). Her blood pressure is 120/79 and her pulse is 71. Her respiration is 16. . The lungs are clear. The heart has a regular rhythm and rate. The right breast area shows diffuse erythema without any moist desquamation. Impression . . . . . . . The patient is tolerating radiation. Plan . . . . . . . . . . . . Continue treatment as planned.  ________________________________   Blair Promise, PhD, MD

## 2015-08-12 ENCOUNTER — Ambulatory Visit
Admission: RE | Admit: 2015-08-12 | Discharge: 2015-08-12 | Disposition: A | Discharge status: Home / Self Care | Payer: BC Managed Care – PPO | Source: Ambulatory Visit | Attending: Radiation Oncology | Admitting: Radiation Oncology

## 2015-08-12 DIAGNOSIS — Z510 Encounter for antineoplastic radiation therapy: Secondary | ICD-10-CM | POA: Diagnosis not present

## 2015-08-13 ENCOUNTER — Inpatient Hospital Stay: Admission: RE | Admit: 2015-08-13 | Payer: Self-pay | Source: Ambulatory Visit | Admitting: Radiation Oncology

## 2015-08-13 ENCOUNTER — Ambulatory Visit
Admission: RE | Admit: 2015-08-13 | Discharge: 2015-08-13 | Disposition: A | Discharge status: Home / Self Care | Payer: BC Managed Care – PPO | Source: Ambulatory Visit | Attending: Radiation Oncology | Admitting: Radiation Oncology

## 2015-08-13 DIAGNOSIS — C50311 Malignant neoplasm of lower-inner quadrant of right female breast: Secondary | ICD-10-CM

## 2015-08-13 DIAGNOSIS — Z510 Encounter for antineoplastic radiation therapy: Secondary | ICD-10-CM | POA: Diagnosis not present

## 2015-08-13 MED ORDER — BIAFINE EX EMUL
Freq: Every day | CUTANEOUS | Status: DC
  Administered 2015-08-13: 08:00:00 via TOPICAL

## 2015-08-13 NOTE — Progress Notes (Signed)
Patient presented to the clinic following radiation treatment. Patient reports radiaplex burns when she applies it to her right/treated breast. Dime size area of desquamation noted in right mammary fold. Provided patient with Biafine to use instead of radiaplex. Encouraged patient to apply neosporin to area of desquamation. Also, provided patient with non adherent dressings to apply under her breast to absorb moisture and friction while she runs.

## 2015-08-14 ENCOUNTER — Ambulatory Visit
Admission: RE | Admit: 2015-08-14 | Discharge: 2015-08-14 | Disposition: A | Discharge status: Home / Self Care | Payer: BC Managed Care – PPO | Source: Ambulatory Visit | Attending: Radiation Oncology | Admitting: Radiation Oncology

## 2015-08-14 DIAGNOSIS — Z510 Encounter for antineoplastic radiation therapy: Secondary | ICD-10-CM | POA: Diagnosis not present

## 2015-08-17 ENCOUNTER — Ambulatory Visit: Payer: BC Managed Care – PPO

## 2015-08-17 ENCOUNTER — Ambulatory Visit
Admission: RE | Admit: 2015-08-17 | Discharge: 2015-08-17 | Disposition: A | Discharge status: Home / Self Care | Payer: BC Managed Care – PPO | Source: Ambulatory Visit | Attending: Radiation Oncology | Admitting: Radiation Oncology

## 2015-08-17 NOTE — Telephone Encounter (Signed)
S:  The [facility]  B:  She had a nexplanon implanted about 2 years ago.  A:  The implant is in her left upper forearm and is well healed.  She would like to have it explanted.  R:  See PCP within 2 Weeks - Wants to get birth control implant removed.  Given that this is elective and is only because her periods are irregular, she is waiting until September 15 at 8:00 am.  She will call the office if her symptoms change necessitating a sooner appointment.  She verbalizes understanding of all instructions and plan of care.

## 2015-08-17 NOTE — Telephone Encounter (Signed)
Appointment made for nexplanon removal was changed to 09/18/2015 with Dr. Courtney Paris at [facility] as the previous scheduled provided did not remove the devices per the [facility] nurses.

## 2015-08-18 ENCOUNTER — Ambulatory Visit
Admission: RE | Admit: 2015-08-18 | Discharge: 2015-08-18 | Disposition: A | Discharge status: Home / Self Care | Payer: BC Managed Care – PPO | Source: Ambulatory Visit | Attending: Radiation Oncology | Admitting: Radiation Oncology

## 2015-08-18 ENCOUNTER — Encounter: Payer: Self-pay | Admitting: Radiation Oncology

## 2015-08-18 VITALS — BP 117/76 | HR 86 | Resp 16 | Wt 173.5 lb

## 2015-08-18 DIAGNOSIS — Z510 Encounter for antineoplastic radiation therapy: Secondary | ICD-10-CM | POA: Diagnosis not present

## 2015-08-18 DIAGNOSIS — C50311 Malignant neoplasm of lower-inner quadrant of right female breast: Secondary | ICD-10-CM

## 2015-08-18 NOTE — Progress Notes (Signed)
  Radiation Oncology         (336) [phone] ________________________________  Name: Kara Meadows MRN: [id number]  Date: 08/18/2015  DOB: 23-Aug-1981  Weekly Radiation Therapy Management    ICD-9-CM ICD-10-CM   1. Breast cancer of lower-inner quadrant of right female breast 174.3 C50.311     Current Dose: 45 Gy     Planned Dose:  60.4 Gy  Narrative . . . . . . . . The patient presents for routine under treatment assessment.                                   The patient has noticed discomfort in the inframammary fold and axillary region. She is applying Neosporin ointment to the inframammary fold and will consider this for her axillary region. She denies any drainage from either site. She is not needing pain medication for these issues.                                 Set-up films were reviewed.                                 The chart was checked. Physical Findings. . .  weight is 173 lb 8 oz (78.699 kg). Her blood pressure is 117/76 and her pulse is 86. Her respiration is 16. .  The lungs are clear. The heart has a regular rhythm and rate. The right breast area shows erythema and dry desquamation but no significant moist desquamation. Impression . . . . . . . The patient is tolerating radiation. Plan . . . . . . . . . . . . Continue treatment as planned.  ________________________________   Blair Promise, PhD, MD

## 2015-08-18 NOTE — Progress Notes (Signed)
  Radiation Oncology         [phone] ________________________________  Name: Kara Meadows MRN: [id number]  Date: 08/18/2015  DOB: 17-Jul-1981  Simulation Verification Note - Electron Beam    ICD-9-CM ICD-10-CM   1. Breast cancer of lower-inner quadrant of right female breast 174.3 C50.311     Status: outpatient  NARRATIVE: The patient was brought to the treatment unit and placed in the planned treatment position. The clinical setup was verified. Then port films were obtained and uploaded to the radiation oncology medical record software.  The treatment beams were carefully compared against the planned radiation fields. The position location and shape of the radiation fields was reviewed. They targeted volume of tissue appears to be appropriately covered by the radiation beams. Organs at risk appear to be excluded as planned.  Based on my personal review, I approved the simulation verification. The patient's treatment will proceed as planned.  -----------------------------------  Blair Promise, PhD, MD

## 2015-08-18 NOTE — Progress Notes (Signed)
Weight and vitals stable. Denies pain. Reports mild fatigue mostly presents midday. Hyperpigmentation of right breast with desquamation noted right mammary fold and axilla. Reports using biafine and neosporin as directed.  BP 117/76 mmHg  Pulse 86  Resp 16  Wt 173 lb 8 oz (78.699 kg) Wt Readings from Last 3 Encounters:  08/18/15 173 lb 8 oz (78.699 kg)  08/11/15 172 lb 1.6 oz (78.064 kg)  08/04/15 175 lb 8 oz (79.606 kg)

## 2015-08-19 ENCOUNTER — Ambulatory Visit
Admission: RE | Admit: 2015-08-19 | Discharge: 2015-08-19 | Disposition: A | Discharge status: Home / Self Care | Payer: BC Managed Care – PPO | Source: Ambulatory Visit | Attending: Radiation Oncology | Admitting: Radiation Oncology

## 2015-08-19 DIAGNOSIS — Z510 Encounter for antineoplastic radiation therapy: Secondary | ICD-10-CM | POA: Diagnosis not present

## 2015-08-20 ENCOUNTER — Ambulatory Visit
Admission: RE | Admit: 2015-08-20 | Discharge: 2015-08-20 | Disposition: A | Discharge status: Home / Self Care | Payer: BC Managed Care – PPO | Source: Ambulatory Visit | Attending: Radiation Oncology | Admitting: Radiation Oncology

## 2015-08-20 DIAGNOSIS — Z510 Encounter for antineoplastic radiation therapy: Secondary | ICD-10-CM | POA: Diagnosis not present

## 2015-08-21 ENCOUNTER — Ambulatory Visit: Payer: BC Managed Care – PPO

## 2015-08-21 ENCOUNTER — Ambulatory Visit
Admission: RE | Admit: 2015-08-21 | Discharge: 2015-08-21 | Disposition: A | Discharge status: Home / Self Care | Payer: BC Managed Care – PPO | Source: Ambulatory Visit | Attending: Radiation Oncology | Admitting: Radiation Oncology

## 2015-08-21 DIAGNOSIS — Z510 Encounter for antineoplastic radiation therapy: Secondary | ICD-10-CM | POA: Diagnosis not present

## 2015-08-22 ENCOUNTER — Ambulatory Visit: Payer: BC Managed Care – PPO

## 2015-08-24 ENCOUNTER — Ambulatory Visit
Admission: RE | Admit: 2015-08-24 | Discharge: 2015-08-24 | Disposition: A | Discharge status: Home / Self Care | Payer: BC Managed Care – PPO | Source: Ambulatory Visit | Attending: Radiation Oncology | Admitting: Radiation Oncology

## 2015-08-24 ENCOUNTER — Ambulatory Visit: Payer: BC Managed Care – PPO

## 2015-08-24 DIAGNOSIS — Z510 Encounter for antineoplastic radiation therapy: Secondary | ICD-10-CM | POA: Diagnosis not present

## 2015-08-25 ENCOUNTER — Encounter: Payer: Self-pay | Admitting: Radiation Oncology

## 2015-08-25 ENCOUNTER — Ambulatory Visit
Admission: RE | Admit: 2015-08-25 | Discharge: 2015-08-25 | Disposition: A | Discharge status: Home / Self Care | Payer: BC Managed Care – PPO | Source: Ambulatory Visit | Attending: Radiation Oncology | Admitting: Radiation Oncology

## 2015-08-25 VITALS — BP 116/76 | HR 82 | Temp 98.2°F | Resp 16 | Ht 65.0 in | Wt 171.5 lb

## 2015-08-25 DIAGNOSIS — Z510 Encounter for antineoplastic radiation therapy: Secondary | ICD-10-CM | POA: Diagnosis not present

## 2015-08-25 DIAGNOSIS — C50311 Malignant neoplasm of lower-inner quadrant of right female breast: Secondary | ICD-10-CM

## 2015-08-25 NOTE — Progress Notes (Signed)
Kara Meadows has completed 30 fractions to her right breast.  She reports soreness in her right breast.  She reports fatigue.  The skin on her right breast is red and peeling.  She is using biafine.    BP 116/76 mmHg  Pulse 82  Temp(Src) 98.2 F (36.8 C) (Oral)  Resp 16  Ht 5\' 5"  (1.651 m)  Wt 171 lb 8 oz (77.792 kg)  BMI 28.54 kg/m2

## 2015-08-25 NOTE — Progress Notes (Signed)
  Radiation Oncology         [phone] ________________________________  Name: Kara Meadows MRN: [id number]  Date: 08/25/2015  DOB: 19-Sep-1981  Weekly Radiation Therapy Management    ICD-9-CM ICD-10-CM   1. Breast cancer of lower-inner quadrant of right female breast 174.3 C50.311     Current Dose: 54.4 Gy     Planned Dose:  60.4 Gy  Narrative . . . . . . . . The patient presents for routine under treatment assessment.                                   The patient has some soreness within her right breast and moderate fatigue. Have written her extension for staying out of work until September 19 in light of her fatigue issues.                                 Set-up films were reviewed.                                 The chart was checked. Physical Findings. . .  height is 5\' 5"  (1.651 m) and weight is 171 lb 8 oz (77.792 kg). Her oral temperature is 98.2 F (36.8 C). Her blood pressure is 116/76 and her pulse is 82. Her respiration is 16. . Diffuse erythema noted in the right breast. No moist desquamation Impression . . . . . . . The patient is tolerating radiation. Plan . . . . . . . . . . . . Continue treatment as planned.  ________________________________   Blair Promise, PhD, MD

## 2015-08-26 ENCOUNTER — Ambulatory Visit
Admission: RE | Admit: 2015-08-26 | Discharge: 2015-08-26 | Disposition: A | Discharge status: Home / Self Care | Payer: BC Managed Care – PPO | Source: Ambulatory Visit | Attending: Radiation Oncology | Admitting: Radiation Oncology

## 2015-08-26 DIAGNOSIS — Z510 Encounter for antineoplastic radiation therapy: Secondary | ICD-10-CM | POA: Diagnosis not present

## 2015-08-27 ENCOUNTER — Ambulatory Visit
Admission: RE | Admit: 2015-08-27 | Discharge: 2015-08-27 | Disposition: A | Discharge status: Home / Self Care | Payer: BC Managed Care – PPO | Source: Ambulatory Visit | Attending: Radiation Oncology | Admitting: Radiation Oncology

## 2015-08-27 ENCOUNTER — Ambulatory Visit: Payer: BC Managed Care – PPO

## 2015-08-27 DIAGNOSIS — Z510 Encounter for antineoplastic radiation therapy: Secondary | ICD-10-CM | POA: Insufficient documentation

## 2015-08-27 DIAGNOSIS — C50311 Malignant neoplasm of lower-inner quadrant of right female breast: Secondary | ICD-10-CM | POA: Diagnosis not present

## 2015-08-28 ENCOUNTER — Encounter: Payer: Self-pay | Admitting: Radiation Oncology

## 2015-08-28 ENCOUNTER — Encounter: Payer: Self-pay | Admitting: *Deleted

## 2015-08-28 ENCOUNTER — Ambulatory Visit
Admission: RE | Admit: 2015-08-28 | Discharge: 2015-08-28 | Disposition: A | Discharge status: Home / Self Care | Payer: BC Managed Care – PPO | Source: Ambulatory Visit | Attending: Radiation Oncology | Admitting: Radiation Oncology

## 2015-08-28 ENCOUNTER — Ambulatory Visit: Payer: BC Managed Care – PPO

## 2015-08-28 VITALS — BP 125/82 | HR 89 | Temp 98.5°F | Resp 18 | Wt 170.9 lb

## 2015-08-28 DIAGNOSIS — C50311 Malignant neoplasm of lower-inner quadrant of right female breast: Secondary | ICD-10-CM | POA: Diagnosis not present

## 2015-08-28 DIAGNOSIS — Z510 Encounter for antineoplastic radiation therapy: Secondary | ICD-10-CM | POA: Insufficient documentation

## 2015-08-28 NOTE — Progress Notes (Addendum)
Weekly rad tx right breast 33/33 completed, erytjema, under inframmary fold has peeled,healing was using neosporin in that area,biafine cream elsewhere on breast, tender, no other c/o 1 month f/u appt card given, gave flyer  On [facility] will e-mail Lourena Simmonds is interested in joining 8:23 AM BP 125/82 mmHg  Pulse 89  Temp(Src) 98.5 F (36.9 C) (Oral)  Resp 18  Wt 170 lb 14.4 oz (77.52 kg)  Wt Readings from Last 3 Encounters:  08/28/15 170 lb 14.4 oz (77.52 kg)  08/25/15 171 lb 8 oz (77.792 kg)  08/18/15 173 lb 8 oz (78.699 kg)

## 2015-08-28 NOTE — Progress Notes (Signed)
  Department of Radiation Oncology  Phone:  [phone] Fax:        [phone]  Weekly Treatment Note    Name: Kara Meadows Date: 08/31/2015 MRN: [id number] DOB: 05-17-1981   Current dose: 60.4 Gy  Current fraction: 33   MEDICATIONS: Current Outpatient Prescriptions  Medication Sig Dispense Refill  . emollient (BIAFINE) cream Apply topically as needed.    Marland Kitchen FLUoxetine (PROZAC) 10 MG capsule     . hyaluronate sodium (RADIAPLEXRX) GEL Apply 1 application topically 2 (two) times daily.    Marland Kitchen ibuprofen (ADVIL,MOTRIN) 400 MG tablet Take 400 mg by mouth every 8 (eight) hours as needed. Maybe once a week    . Levonorgestrel 13.5 MG IUD by Intrauterine route.    . non-metallic deodorant Kara Meadows) MISC Apply 1 application topically daily as needed.    . venlafaxine XR (EFFEXOR-XR) 75 MG 24 hr capsule     . tamoxifen (NOLVADEX) 20 MG tablet Take 1 tablet (20 mg total) by mouth daily. Start September 1st (Patient not taking: Reported on 06/17/2015) 90 tablet 3   No current facility-administered medications for this encounter.     ALLERGIES: Amoxicillin   LABORATORY DATA:  Lab Results  Component Value Date   WBC 5.4 04/15/2015   HGB 13.6 06/04/2015   HCT 39.8 04/15/2015   MCV 88.9 04/15/2015   PLT 185 04/15/2015   Lab Results  Component Value Date   NA 141 04/15/2015   K 4.2 04/15/2015   CO2 24 04/15/2015   Lab Results  Component Value Date   ALT 16 04/15/2015   AST 18 04/15/2015   ALKPHOS 58 04/15/2015   BILITOT <0.20 04/15/2015     NARRATIVE: Kara Meadows was seen today for weekly treatment management. The chart was checked and the patient's films were reviewed. Weekly rad tx right breast 33/33 completed. Erythema, under inframmary fold has peeled. She reports using neosporin in that area and biafine cream elsewhere on the right breast. Reports the breast is tender. No other c/o. 1 month f/u appt card and a flyer on [facility] were given. Will e-mail Kara Meadows.  Pt is interested in joining.  PHYSICAL EXAMINATION: weight is 170 lb 14.4 oz (77.52 kg). Her oral temperature is 98.5 F (36.9 C). Her blood pressure is 125/82 and her pulse is 89. Her respiration is 18.  Diffuse erythema in the right breast area without any majors areas of desquamation.  ASSESSMENT: The patient did satisfactorily with treatment.  PLAN: The patient will follow-up in our clinic in 1 month.  This document serves as a record of services personally performed by Kyung Rudd, MD. It was created on his behalf by Darcus Austin, a trained medical scribe. The creation of this record is based on the scribe's personal observations and the provider's statements to them. This document has been checked and approved by the attending provider.

## 2015-09-02 ENCOUNTER — Ambulatory Visit
Admit: 2015-09-02 | Discharge: 2015-09-02 | Payer: PRIVATE HEALTH INSURANCE | Attending: Infectious Disease | Primary: Internal Medicine

## 2015-09-02 DIAGNOSIS — B182 Chronic viral hepatitis C: Secondary | ICD-10-CM

## 2015-09-02 NOTE — Progress Notes (Signed)
Subjective:      Patient ID: Alyssa Freeman is a 34 y.o. female.    HPI Comments: Alyssa Freeman returned for follow-up. She is a genotype 1a/1b baseline viral load about 5.8 logs treatment naive and F1 disease by [employer] testing. She completed 12 weeks with Zepatier and been tolerating very well without problems. Labs reviewed and discussed, noted HCV RNA non-detectable (reflecting post treatment about 1 month). Will check HCV RNA in about 2 months for [facility] assessment. Plan discussed in details with patient and verbalized understanding      Review of Systems   Constitutional: Negative for activity change, appetite change, chills, diaphoresis, fatigue (no longer an issue), fever and unexpected weight change.   HENT: Negative.  Negative for congestion, facial swelling and trouble swallowing.    Eyes: Negative.  Negative for photophobia and discharge.   Respiratory: Negative.  Negative for cough, chest tightness, shortness of breath and wheezing.    Cardiovascular: Negative.  Negative for chest pain, palpitations and leg swelling.   Gastrointestinal: Negative.  Negative for abdominal pain, constipation, diarrhea, nausea and vomiting.        Minor heartburn depends on diet   Endocrine: Negative.  Negative for cold intolerance and heat intolerance.   Genitourinary: Negative.  Negative for dysuria, flank pain, frequency and hematuria.   Musculoskeletal: Negative.  Negative for arthralgias, back pain, joint swelling and myalgias.   Skin: Negative.  Negative for color change and rash.        Hair changes   Allergic/Immunologic: Negative.    Neurological: Negative.  Negative for dizziness, syncope, weakness, numbness and headaches.   Hematological: Negative.    Psychiatric/Behavioral: Positive for agitation. Negative for sleep disturbance. The patient is nervous/anxious (controlled).        Objective:   Physical Exam   Constitutional: She is oriented to person, place, and time. She appears well-developed and well-nourished. She  is cooperative. No distress.   HENT:   Head: Normocephalic and atraumatic.   Right Ear: External ear normal.   Left Ear: External ear normal.   Nose: Nose normal.   Mouth/Throat: Oropharynx is clear and moist. No oropharyngeal exudate.   Eyes: Conjunctivae and EOM are normal. Pupils are equal, round, and reactive to light. No scleral icterus.   Neck: Normal range of motion. Neck supple. No JVD present. No tracheal deviation present. No thyromegaly present.   Cardiovascular: Normal rate, regular rhythm and normal heart sounds.  Exam reveals no gallop and no friction rub.    No murmur heard.  Pulmonary/Chest: Effort normal and breath sounds normal. No stridor. No respiratory distress. She has no wheezes. She has no rales. She exhibits no tenderness.   Abdominal: Soft. Bowel sounds are normal. She exhibits no distension and no mass. There is no hepatosplenomegaly. There is no tenderness. There is no rebound and no guarding.   Musculoskeletal: Normal range of motion. She exhibits no edema or tenderness.   Lymphadenopathy:     She has no cervical adenopathy.   Neurological: She is alert and oriented to person, place, and time. She has normal strength. No cranial nerve deficit or sensory deficit. Coordination and gait normal.   Skin: Skin is warm and dry. No rash noted. She is not diaphoretic. No cyanosis or erythema. No pallor. Nails show no clubbing.   Psychiatric: She has a normal mood and affect. Her speech is normal and behavior is normal. Judgment and thought content normal. Cognition and memory are normal.   Nursing note and vitals reviewed.  Assessment:      1. Chronic hepatitis C without hepatic coma (HCC)  Hepatitis A hepatitis B combined vaccine IM   2. Need for prophylactic vaccination and inoculation against viral hepatitis  Hepatitis A hepatitis B combined vaccine IM           Plan:       Chronic HCV genotype 1a/1b baseline viral load about 5.8 logs treatment naive and F1 disease by [employer] and  completed 12 weeks with Zepatier.  1. Check HCV RNA in about 2 months for [facility] assessment.  2. Discussed about healthy life-style and EtOH usage.  See back as needed.    Spent >51% of time discussing, counseling and coordinating care for patient. Time spent 15 minutes.  Discussed with the patient and all questioned fully answered. She will call me if any problems arise.      Simone Curia DO FACP FIDSA FASCP

## 2015-09-10 ENCOUNTER — Encounter: Attending: Advanced Practice Midwife | Primary: Internal Medicine

## 2015-09-13 ENCOUNTER — Encounter: Payer: Self-pay | Admitting: Radiation Oncology

## 2015-09-13 NOTE — Progress Notes (Signed)
  Radiation Oncology         [phone] ________________________________  Name: Kara Meadows MRN: [id number]  Date: 09/13/2015  DOB: 1981-12-23  End of Treatment Note  Diagnosis:   ICD-9-CM ICD-10-CM   1. Breast cancer of lower-inner quadrant of right female breast         T2, N0    Indication for treatment:  Breast conservation therapy       Radiation treatment dates:   July 19 through September 2  Site/dose:   Right breast 50.4 gray,  lumpectomy cavity boost 10 gray in 5 fractions  Beams/energy:   3-D conformal for initial breast set up tangential fields, custom electron cutout field for her lumpectomy cavity boost, 12 MEV  Narrative: The patient tolerated radiation treatment relatively well.   Some discomfort/soreness within the breast and fatigue, no moist desquamation, diffuse erythema  Plan: The patient has completed radiation treatment. The patient will return to radiation oncology clinic for routine followup in one month. I advised them to call or return sooner if they have any questions or concerns related to their recovery or treatment.  -----------------------------------  Blair Promise, PhD, MD

## 2015-09-18 ENCOUNTER — Ambulatory Visit
Admit: 2015-09-18 | Discharge: 2015-09-18 | Payer: PRIVATE HEALTH INSURANCE | Attending: Obstetrics & Gynecology | Primary: Internal Medicine

## 2015-09-18 DIAGNOSIS — Z3046 Encounter for surveillance of implantable subdermal contraceptive: Secondary | ICD-10-CM

## 2015-09-18 MED ORDER — NORETHINDRONE 0.35 MG PO TABS
0.35 MG | ORAL_TABLET | Freq: Every day | ORAL | 4 refills | Status: DC
Start: 2015-09-18 — End: 2016-12-29

## 2015-09-18 NOTE — Progress Notes (Signed)
34 y.o.  09/18/2015    Chief Complaint   Patient presents with   ??? Procedure     Nexplanon Removal        HPI:  Alyssa Freeman is a 34 y.o. female is requesting to have her Implanon removed. It was placed on 2014. She plans on using POP for contraception.      Past Medical History   Diagnosis Date   ??? Depression    ??? GERD (gastroesophageal reflux disease)    ??? Hypertension    ??? Liver disease      HCV GT 1a/1b   ??? Rash    ??? Sleep disorder      Past Surgical History   Procedure Laterality Date   ??? Cholecystectomy     ??? Femur surgery Right      with hardware placement     Family History   Problem Relation Age of Onset   ??? High Blood Pressure Mother    ??? Depression Mother    ??? High Blood Pressure Father      Social History   Substance Use Topics   ??? Smoking status: Current Every Day Smoker     Packs/day: 0.50     Years: 20.00     Types: Cigarettes   ??? Smokeless tobacco: Never Used      Comment: Patient is a 1/2 pack a day smoker and has smoked since the age of 13 years   ??? Alcohol use No      Comment: 07/12/2014 sober date       MEDS:  Current Outpatient Prescriptions on File Prior to Visit   Medication Sig Dispense Refill   ??? sertraline (ZOLOFT) 100 MG tablet TAKE 1 AND 1/2 TABLETS BY MOUTH EVERY DAY 45 tablet 11   ??? traZODone (DESYREL) 100 MG tablet Take 0.5 tablets by mouth nightly 30 tablet 2   ??? omeprazole (PRILOSEC) 20 MG capsule Take 20 mg by mouth once       No current facility-administered medications on file prior to visit.        ALLERGY:  Allergies as of 09/18/2015   ??? (No Known Allergies)       OB History   No data available       PHYSICAL EXAM :   Vitals:    09/18/15 1509   BP: 120/71   Site: Left Arm   Position: Sitting   Pulse: 85   Weight: 187 lb (84.8 kg)     CONSTITUTIONAL: Well nourished, well-developed no acute distress  RESPIRATORY: Normal effort   SKIN: Intact, dry Implant palpable in medial aspect of the upper left arm.   NEUROLOGICAL: no gross motor or sensory deficits noted. Marland Kitchen   PSYCHIATRIC Normal  mood and affect, A&O x3.    PROCEDURE:  Patient's last menstrual period was 09/01/2015.  The patient was positioned comfortably on our procedure table. She was consented earlier in the appointment and the procedure risk and complications were reviewed. A sterile prep and drape was completed and 1ml of 2% lidocaine with epi was used for local anesthetic. A small skin incision was made just beyond the proximal tip of the insert and utilizing blunt dissection the contraceptive implant was grasped and removed. A  steri-strip and bandaid was applied. The patient tolerated the procedure well.     ASSESSMENT/PLAN:  Alyssa Freeman was seen today for procedure.    Diagnoses and all orders for this visit:    Nexplanon removal    Other orders  -  norethindrone (ORTHO MICRONOR) 0.35 MG tablet; Take 1 tablet by mouth daily      No Follow-up on file.  She is to notify our office if any swelling, redness, temperature, or limb restriction or numbness.Showers are allowed in 36 hours. She may take Tylenol for any pain.

## 2015-09-23 ENCOUNTER — Other Ambulatory Visit: Payer: Self-pay | Admitting: Adult Health

## 2015-09-23 DIAGNOSIS — C50311 Malignant neoplasm of lower-inner quadrant of right female breast: Secondary | ICD-10-CM

## 2015-10-02 ENCOUNTER — Encounter: Payer: Self-pay | Admitting: Hematology and Oncology

## 2015-10-02 ENCOUNTER — Telehealth: Payer: Self-pay | Admitting: Hematology and Oncology

## 2015-10-02 ENCOUNTER — Ambulatory Visit (HOSPITAL_BASED_OUTPATIENT_CLINIC_OR_DEPARTMENT_OTHER): Payer: BC Managed Care – PPO | Admitting: Hematology and Oncology

## 2015-10-02 VITALS — BP 129/80 | HR 85 | Temp 98.3°F | Resp 18 | Ht 65.0 in | Wt 175.2 lb

## 2015-10-02 DIAGNOSIS — Z170 Estrogen receptor positive status [ER+]: Secondary | ICD-10-CM

## 2015-10-02 DIAGNOSIS — Z79811 Long term (current) use of aromatase inhibitors: Secondary | ICD-10-CM | POA: Diagnosis not present

## 2015-10-02 DIAGNOSIS — C50311 Malignant neoplasm of lower-inner quadrant of right female breast: Secondary | ICD-10-CM

## 2015-10-02 NOTE — Assessment & Plan Note (Signed)
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 I stressed on the importance of compliance with tamoxifen. Patient understands that she cannot pregnant on tamoxifen has to use contraception. Return to clinic in 3 months for follow-up.

## 2015-10-02 NOTE — Telephone Encounter (Signed)
Appointments made and avs printed for pateint °

## 2015-10-02 NOTE — Progress Notes (Signed)
Patient Care Team: Carlos Levering, PA-C as PCP - General (Family Medicine) Erroll Luna, MD as Consulting Physician (General Surgery) Nicholas Lose, MD as Consulting Physician (Hematology and Oncology) Gery Pray, MD as Consulting Physician (Radiation Oncology) Rockwell Germany, RN as Registered Nurse Mauro Kaufmann, RN as Registered Nurse Holley Bouche, NP as Nurse Practitioner (Nurse Practitioner)  DIAGNOSIS: Breast cancer of lower-inner quadrant of right female breast [facility])   Staging form: Breast, AJCC 7th Edition     Clinical stage from 04/15/2015: Stage IA (T1c, N0, M0) - Unsigned     Pathologic stage from 05/05/2015: Stage IIA (T2, N0, cM0) - Signed by Enid Cutter, MD on 05/12/2015       Staging comments: Staged on final lumpectomy specimen by Dr. 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, ER 100%, PR 100%, Ki-67 30%, HER-2 negative   04/10/2015 Breast MRI Right breast mass 1.7 x 1.7 x 0.9 cm, lymph nodes negative   04/30/2015 Surgery  Right breast lumpectomy: IDC 2.1 cm, positive for LVI, DCIS less than 0.01 millimeters from inferior margin, 2 SLN negative, grade 3 , ER 99%, PR 100%, HER-2 negative, Ki-67 24%, Oncotype 17 (11% ROR)   06/04/2015 Surgery Margins re-excision Negative   07/14/2015 - 08/28/2015 Radiation Therapy Adjuvant XRT Right breast 50.4 gray, lumpectomy cavity boost 10 gray in 5 fractions   09/14/2015 -  Anti-estrogen oral therapy Tamoxifen 20 mg daily    CHIEF COMPLIANT: follow-up on tamoxifen  INTERVAL HISTORY: Kara Meadows is a 34 year old above-mentioned history of right-sided breast cancer who is currently on tamoxifen adjuvant therapy. She tolerated it extremely well so far. She's been on it for one and half weeks. Denies any hot flashes or myalgias. She does have episodes of depression and  she changed her antidepressant from Paxil to Effexor. She is being closely followed by her psychiatrist. Patient had been to [facility] and they recommended ovarian suppression with anastrozole. But she does not want to go on ovarian suppression.  REVIEW OF SYSTEMS:   Constitutional: Denies fevers, chills or abnormal weight loss Eyes: Denies blurriness of vision Ears, nose, mouth, throat, and face: Denies mucositis or sore throat Respiratory: Denies cough, dyspnea or wheezes Cardiovascular: Denies palpitation, chest discomfort or lower extremity swelling Gastrointestinal:  Denies nausea, heartburn or change in bowel habits Skin: Denies abnormal skin rashes Lymphatics: Denies new lymphadenopathy or easy bruising Neurological:Denies numbness, tingling or new weaknesses Behavioral/Psych: depression  All other systems were reviewed with the patient and are negative.  I have reviewed the past medical history, past surgical history, social history and family history with the patient and they are unchanged from previous note.  ALLERGIES:  is allergic to amoxicillin.  MEDICATIONS:  Current Outpatient Prescriptions  Medication Sig Dispense Refill  . emollient (BIAFINE) cream Apply topically as needed.    Marland Kitchen FLUoxetine (PROZAC) 10 MG capsule     . hyaluronate sodium (RADIAPLEXRX) GEL Apply 1 application topically 2 (two) times daily.    Marland Kitchen ibuprofen (ADVIL,MOTRIN) 400 MG tablet Take 400 mg by mouth every 8 (eight) hours as needed. Maybe once a week    . Levonorgestrel 13.5 MG IUD by Intrauterine route.    . non-metallic deodorant Jethro Poling) MISC Apply 1 application topically daily as needed.    . tamoxifen (NOLVADEX) 20 MG tablet Take 1 tablet (20  mg total) by mouth daily. Start September 1st (Patient not taking: Reported on 06/17/2015) 90 tablet 3  . venlafaxine XR (EFFEXOR-XR) 75 MG 24 hr capsule      No current facility-administered medications for this visit.    PHYSICAL EXAMINATION: ECOG  PERFORMANCE STATUS: 1 - Symptomatic but completely ambulatory  Filed Vitals:   10/02/15 0910  BP: 129/80  Pulse: 85  Temp: 98.3 F (36.8 C)  Resp: 18   Filed Weights   10/02/15 0910  Weight: 175 lb 3.2 oz (79.47 kg)    GENERAL:alert, no distress and comfortable SKIN: skin color, texture, turgor are normal, no rashes or significant lesions EYES: normal, Conjunctiva are pink and non-injected, sclera clear OROPHARYNX:no exudate, no erythema and lips, buccal mucosa, and tongue normal  NECK: supple, thyroid normal size, non-tender, without nodularity LYMPH:  no palpable lymphadenopathy in the cervical, axillary or inguinal LUNGS: clear to auscultation and percussion with normal breathing effort HEART: regular rate & rhythm and no murmurs and no lower extremity edema ABDOMEN:abdomen soft, non-tender and normal bowel sounds Musculoskeletal:no cyanosis of digits and no clubbing  NEURO: alert & oriented x 3 with fluent speech, no focal motor/sensory deficits  LABORATORY DATA:  I have reviewed the data as listed   Chemistry      Component Value Date/Time   NA 141 04/15/2015 1203   K 4.2 04/15/2015 1203   CO2 24 04/15/2015 1203   BUN 12.9 04/15/2015 1203   CREATININE 0.8 04/15/2015 1203      Component Value Date/Time   CALCIUM 8.9 04/15/2015 1203   ALKPHOS 58 04/15/2015 1203   AST 18 04/15/2015 1203   ALT 16 04/15/2015 1203   BILITOT <0.20 04/15/2015 1203       Lab Results  Component Value Date   WBC 5.4 04/15/2015   HGB 13.6 06/04/2015   HCT 39.8 04/15/2015   MCV 88.9 04/15/2015   PLT 185 04/15/2015   NEUTROABS 2.6 04/15/2015   ASSESSMENT & PLAN:  Breast cancer of lower-inner quadrant of right female breast Right breast lumpectomy 04/30/2015: Invasive ductal carcinoma 2.1 cm, positive for lymphovascular invasion, DCIS less than 0.01 millimeters from inferior margin, 2 sentinel nodes negative, grade 3 , ER 99%, PR 100%, HER-2 negative, Ki-67 24%, Oncotype 17 (11% ROR),  status post radiation completed 08/28/2015, started tamoxifen 09/15/2015  Tamoxifen toxicities: 1. Tolerating tamoxifen extremely well without any side effects. Denies any hot flashes or myalgias.  I did not recommend ovarian suppression given the fact that she was not high risk. I stressed on the importance of compliance with tamoxifen. Patient understands that she cannot pregnant on tamoxifen has to use contraception. Return to clinic in 6 months for follow-up.  No orders of the defined types were placed in this encounter.   The patient has a good understanding of the overall plan. she agrees with it. she will call with any problems that may develop before the next visit here.   Rulon Eisenmenger, MD

## 2015-10-15 ENCOUNTER — Encounter: Payer: BC Managed Care – PPO | Admitting: Nurse Practitioner

## 2015-10-15 ENCOUNTER — Ambulatory Visit: Payer: BC Managed Care – PPO | Admitting: Radiation Oncology

## 2015-10-26 ENCOUNTER — Encounter: Payer: Self-pay | Admitting: Oncology

## 2015-10-27 ENCOUNTER — Encounter: Payer: Self-pay | Admitting: Radiation Oncology

## 2015-10-27 ENCOUNTER — Ambulatory Visit (HOSPITAL_BASED_OUTPATIENT_CLINIC_OR_DEPARTMENT_OTHER): Payer: BC Managed Care – PPO | Admitting: Nurse Practitioner

## 2015-10-27 ENCOUNTER — Encounter: Payer: Self-pay | Admitting: Nurse Practitioner

## 2015-10-27 ENCOUNTER — Ambulatory Visit
Admission: RE | Admit: 2015-10-27 | Discharge: 2015-10-27 | Disposition: A | Discharge status: Home / Self Care | Payer: BC Managed Care – PPO | Source: Ambulatory Visit | Attending: Radiation Oncology | Admitting: Radiation Oncology

## 2015-10-27 VITALS — BP 127/86 | HR 78 | Temp 98.6°F | Resp 18 | Ht 65.0 in | Wt 182.5 lb

## 2015-10-27 VITALS — BP 123/77 | HR 79 | Temp 98.2°F | Resp 20 | Wt 183.6 lb

## 2015-10-27 DIAGNOSIS — C50311 Malignant neoplasm of lower-inner quadrant of right female breast: Secondary | ICD-10-CM | POA: Diagnosis not present

## 2015-10-27 DIAGNOSIS — Z79811 Long term (current) use of aromatase inhibitors: Secondary | ICD-10-CM | POA: Diagnosis not present

## 2015-10-27 DIAGNOSIS — G4700 Insomnia, unspecified: Secondary | ICD-10-CM | POA: Diagnosis not present

## 2015-10-27 NOTE — Progress Notes (Signed)
  Radiation Oncology         [phone] ________________________________  Name: Kara Meadows MRN: [id number]  Date: 10/27/2015  DOB: 08/22/1981  Follow-Up Visit Note  CC: Edrick Oh, MD    ICD-9-CM ICD-10-CM   1. Breast cancer of lower-inner quadrant of right female breast ([place]) 174.3 C50.311     Diagnosis:   T2, N0 invasive ductal carcinoma of the right breast  Interval Since Last Radiation:  2  months  Narrative:  The patient returns today for routine follow-up.  She is doing well at this time. She has minimal fatigue. She has mild the discomfort in the nipple areolar complex area. She denies any nipple discharge or bleeding. Patient is tolerating her tamoxifen well with minimal hot flashes. No reports of swelling in the right arm or hand.                              ALLERGIES:  is allergic to amoxicillin.  Meds: Current Outpatient Prescriptions  Medication Sig Dispense Refill  . emollient (BIAFINE) cream Apply topically as needed.    Marland Kitchen ibuprofen (ADVIL,MOTRIN) 400 MG tablet Take 400 mg by mouth every 8 (eight) hours as needed. Maybe once a week    . Levonorgestrel 13.5 MG IUD by Intrauterine route.    . tamoxifen (NOLVADEX) 20 MG tablet Take 1 tablet (20 mg total) by mouth daily. Start September 1st 90 tablet 3  . venlafaxine XR (EFFEXOR-XR) 75 MG 24 hr capsule Take 75 mg by mouth daily with breakfast.      No current facility-administered medications for this encounter.    Physical Findings: The patient is in no acute distress. Patient is alert and oriented.  weight is 183 lb 9.6 oz (83.28 kg). Her oral temperature is 98.2 F (36.8 C). Her blood pressure is 123/77 and her pulse is 79. Her respiration is 20. Marland Kitchen  No palpable subclavicular or axillary adenopathy. The lungs are clear to auscultation. The heart has regular rhythm and rate. The left breast reveals no palpable mass or nipple discharge. The right breast shows some mild  hyperpigmentation changes. There continues to be some edema in the nipple areolar complex area. No dominant mass appreciated in the breast nipple discharge or bleeding. Skin has healed well Lab Findings: Lab Results  Component Value Date   WBC 5.4 04/15/2015   HGB 13.6 06/04/2015   HCT 39.8 04/15/2015   MCV 88.9 04/15/2015   PLT 185 04/15/2015    Radiographic Findings: No results found.  Impression:  The patient is recovering from the effects of radiation.  No signs of recurrence on clinical exam today  Plan:  Routine follow-up in 3 months.  ____________________________________ Gery Pray, MD

## 2015-10-27 NOTE — Progress Notes (Addendum)
Follow up s/p right breast rad txs 07/14/15-08/28/15, breast well healed, near nipple incisionstill scabs at times,dryness, using vitamin e lotion and biafine still,started Tomaxifen 20mg  daily end of September,2016, and takes Effexor 225mg  daily,has hot flashes occasionally and sharp pains in breast occasionally, energy level improving,saw Dr. Brantley Stage, a few weeks ago, follow up in 6 months 8:19 AM BP 123/77 mmHg  Pulse 79  Temp(Src) 98.2 F (36.8 C) (Oral)  Resp 20  Wt 183 lb 9.6 oz (83.28 kg)  Wt Readings from Last 3 Encounters:  10/27/15 183 lb 9.6 oz (83.28 kg)  10/02/15 175 lb 3.2 oz (79.47 kg)  08/28/15 170 lb 14.4 oz (77.52 kg)

## 2015-10-27 NOTE — Progress Notes (Signed)
CLINIC:  Cancer Survivorship   REASON FOR VISIT:  Routine follow-up post-treatment for a recent history of breast cancer.  BRIEF ONCOLOGIC HISTORY:    Breast cancer of lower-inner quadrant of right female breast ([place])   04/06/2015 Mammogram Right breast: 8 x 15 mm ill-defined focal asymmetry within the LIQ   04/06/2015 Breast US Right breast: 1.5 x 0.8 x 1.4 cm irregular hypoechoic area/mass at the 4 o'clock position, 5 cm from the nipple.    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    04/30/2015 Definitive Surgery  Right breast lumpectomy/SLNB ([place]):: IDC 2.1 cm, positive for LVI, DCIS less than 0.01 millimeters from inferior margin, 2 SLN negative, grade 3 , ER 99%, PR 100%, HER-2 negative, Ki-67 24%   04/30/2015 Pathologic Stage Stage IIA: pT2 pN0   04/30/2015 Oncotype testing Oncotype 17 (11% ROR). No chemotherapy.   06/04/2015 Surgery Margins re-excision Negative   07/14/2015 - 08/28/2015 Radiation Therapy Adjuvant XRT ([place]): Right breast 50.4 Gy over 28 fractions; lumpectomy cavity boost 10 Gy over 5 fractions. Total dose: 60 Gy   09/14/2015 -  Anti-estrogen oral therapy Tamoxifen 20 mg daily. No ovarian suppression (low risk, pt preference). Planned duration of therapy: 5 years    INTERVAL HISTORY:  Kara Meadows presents to the [facility] today for our initial meeting to review her survivorship care plan detailing her treatment course for breast cancer, as well as monitoring long-term side effects of that  treatment, education regarding health maintenance, screening, and overall wellness and health promotion.     Overall, Kara Meadows reports doing well since completing her radiation therapy approximately two months ago.  She saw Dr. Sondra Come immediately prior to this appointment who felt that she is doing well although she still has a bit of swelling around her nipple for which he will see her back in three months.  Her skin changes have diminished and she continues with fatigue.  She continues with some intermittent shooting pain along her breast, approximately 3X/week that is self limiting.  She has some thickening in her right breast following radiation, but denies mass or lesion. She reports a good appetite and denies weight loss. She continues with night sweats, which predate her cancer diagnosis, without fever or chills.  She is tolerating the tamoxifen well stating that the night sweats have not worsened since she began therapy, and she denies any cough, shortness of breath, or vaginal bleeding.  She has not headache or bone pain, although has tightness in her bilateral calves (which also predates her cancer diagnosis / initiation of tamoxifen) for which she sees PT.  She has increased anxiety and stressors here over the last few weeks and has had some difficulty sleeping.  REVIEW OF SYSTEMS:  General: Night sweats and fatigue as above.  Minimal hot flashes. HEENT: Denies visual changes, hearing loss, trouble swallowing or mouth sores. Cardiac: Denies palpitations, chest pain, and lower extremity edema.  Respiratory: Denies dyspnea on exertion.  GI: Mild bloating, constipation, and gas. Denies abdominal pain, diarrhea, nausea, or vomiting.  GU: Denies dysuria, hematuria, vaginal bleeding, vaginal discharge, or vaginal dryness.  Musculoskeletal: Denies joint or bone pain.  Neuro: Denies headache or recent falls. Denies peripheral neuropathy. Skin: Denies rash, pruritis, or open wounds.  Breast: Denies  any new nodularity, masses, tenderness, nipple changes, or nipple discharge.  Psych: Insomnia and anxiety as above. Denies depression or memory loss.   A 14-point review of systems was completed and was negative, except as noted above.   ONCOLOGY TREATMENT TEAM:  1. Surgeon:  Dr. Brantley Stage at [facility]  2. Medical Oncologist: Dr. Lindi Adie 3. Radiation Oncologist: Dr. Sondra Come    PAST MEDICAL/SURGICAL HISTORY:  Past Medical History  Diagnosis Date  . Anxiety   . Depression   . Migraines     rare  . Breast cancer of lower-outer quadrant of right female breast ([place]) 04/10/2015    ER+/PR+/Her2-  . Radiation 07/14/15-08/28/15    right breast 50.4 gray, lumpectomy boost 10 gray   Past Surgical History  Procedure Laterality Date  . Wisdom tooth extraction    . Lasik    . Radioactive seed guided mastectomy with axillary sentinel lymph node biopsy Right 04/30/2015    Procedure: RADIOACTIVE SEED GUIDED RIGHT LUMPECTOMY WITH RIGHT  AXILLARY SENTINEL LYMPH NODE BIOPSY;  Surgeon: Erroll Luna, MD;  Location: [facility];  Service: General;  Laterality: Right;  . Re-excision of breast lumpectomy Right 06/04/2015    Procedure: RIGHT BREAST RE-EXCISION OF BREAST LUMPECTOMY;  Surgeon: Erroll Luna, MD;  Location: [facility];  Service: General;  Laterality: Right;     ALLERGIES:  Allergies  Allergen Reactions  . Amoxicillin Rash    AS A CHILD     CURRENT MEDICATIONS:  Current Outpatient Prescriptions on File Prior to Visit  Medication Sig Dispense Refill  . emollient (BIAFINE) cream Apply topically as needed.    Marland Kitchen ibuprofen (ADVIL,MOTRIN) 400 MG tablet Take 400 mg by mouth every 8 (eight) hours as needed. Maybe once a week    . Levonorgestrel 13.5 MG IUD by Intrauterine route.    . tamoxifen (NOLVADEX) 20 MG tablet Take 1 tablet (20 mg total) by mouth daily. Start September 1st 90 tablet 3  . venlafaxine XR (EFFEXOR-XR) 75 MG 24 hr capsule Take 75 mg  by mouth daily with breakfast.      No current facility-administered medications on file prior to visit.     ONCOLOGIC FAMILY HISTORY:  Family History  Problem Relation Age of Onset  . Hyperlipidemia Mother   . Hypertension Mother   . Hypertension Father   . Hyperlipidemia Father   . Melanoma Father 38  . Hypertension Brother   . Prostate cancer Maternal Uncle 87  . Breast cancer Paternal Aunt 40  . Breast cancer Paternal Grandmother 25  . COPD Maternal Grandfather   . Heart attack Paternal Grandfather      GENETIC COUNSELING/TESTING: Breast/Ovarian panel ([facility]) panel performed 04/17/2015 reveals no clinically significant variant at ATM, BARD1, BRCA1, BRCA2, BRIP1, CDH1, CHEK2, EPCAM, FANCC, MLH1, MSH2, MSH6, NBN, PALB2, PMS2, PTEN, RAD51C, RAD51D, STK11, TP53, and XRCC2.   SOCIAL HISTORY:  GENETTE HUERTAS is married and lives with her spouse in [place], [place].  She has no children.  Ms. Graber is currently working in [place], attempting to work from home at least part of the week.  She denies any current or history of tobacco or illicit drug use.  She uses alcohol rarely.   PHYSICAL EXAMINATION:  Vital Signs:  Filed Vitals:   10/27/15 0907  BP: 127/86  Pulse: 78  Temp: 98.6 F (37 C)  Resp: 18   ECOG Performance Status: 0 General: Well-nourished, well-appearing female in no acute distress.  She is unaccompanied in clinic today.   HEENT: Head is atraumatic and normocephalic.  Pupils equal and reactive to light and accomodation. Conjunctivae clear without exudate.  Sclerae anicteric. Oral mucosa is pink, moist, and intact without lesions.  Oropharynx is pink without lesions or erythema.  Lymph: No cervical, supraclavicular, infraclavicular, or axillary lymphadenopathy noted on palpation.  GI: Abdomen soft and round. No tenderness to palpation.   GU: Deferred.  Neuro: No focal deficits. Steady gait.  Psych: Mood and affect normal and appropriate for  situation.  Extremities: No edema, cyanosis, or clubbing.  Skin: Warm and dry. No open lesions noted.   LABORATORY DATA:  None for this visit.  DIAGNOSTIC IMAGING:  None for this visit.     ASSESSMENT AND PLAN:   1. History of breast cancer: Stage IIA invasive ductal carcinoma of the right breast, ER/PR+, HER2/neu negative, S/P lumpectomy and adjuvant radiation therapy, now on adjuvant endocrine therapy with tamoxifen followed in a program of surveillance.   Ms. Lehenbauer is doing well without clinical symptoms worrisome for disease recurrence.  She will follow-up with her medical oncologist,  Dr. Lindi Adie, in April 2017 with history and physical exam per surveillance protocol.  She will continue her anti-estrogen therapy with tamoxifen as prescribed by  Dr. Lindi Adie. She was instructed to make Dr. Lindi Adie or myself aware if she begins to experience any side effects of the medication and I could see her back in clinic to help manage those side effects, as needed. Though the incidence is low, there is an associated risk of endometrial cancer with anti-estrogen therapies like Tamoxifen.  Ms. Lish was encouraged to contact Dr. Lindi Adie or myself with any vaginal bleeding while taking Tamoxifen. Other side effects of Tamoxifen were again reviewed with her as well. A comprehensive survivorship care plan and treatment summary was reviewed with the patient today detailing her breast cancer diagnosis, treatment course, potential late/long-term effects of treatment, appropriate follow-up care with recommendations for the future, and patient education resources.  A copy of this summary, along with a letter will be sent to the patient's primary care provider via in basket message after today's visit.  Ms. Mounsey is welcome to return to the Survivorship Clinic in the future, as needed; no follow-up will be scheduled at this time.    2. Insomnia  I believe that Ms. Griffith's complains of insomnia are related to her  increased anxiety.  In addition to discussing interventions for her anxiety (please see below), I encouraged her to limit caffiene and spicy foods in the evening, set a regular sleep and wake schedule, and consider the use of melatonin to aid in her sleep.    3. Cancer screening:  Due to Ms. Dales's history and her age, she should receive screening for skin cancers, colon cancer, and gynecologic cancers.  The information and recommendations are listed on the patient's comprehensive care plan/treatment summary and were reviewed in detail with the patient.    4. Health maintenance and wellness promotion: Ms. Dardis was encouraged to continue to consume 5-7 servings of fruits and vegetables per day. She is a vegan and already well familiar with the benefits of fruits and vegetables. We reviewed the "Nutrition for Breast Cancer Survivors" handout.  She was also encouraged to engage in moderate to  vigorous exercise for 30 minutes per day most days of the week. We discussed the [employer] fitness program, which is designed for cancer survivors to help them become more physically fit after cancer treatments, and she states that she is signed up to begin the next session after the first of the year.  She was instructed to limit her alcohol consumption and continue to abstain from tobacco use.   5. Support services/counseling: Ms. Behan has been experiencing increased anxiety, episodes of tearfulness, and stress over the last few weeks.   It is not uncommon for this period of the patient's cancer care trajectory to be one of many emotions and stressors. We discussed the increased anxiety that can occur following completion of treatment and ways to combat it.  She has seen a therapist in the past and has a good relationship with them.  She is interested in resuming that relationship now that treatment has completed and hopefully she is about to have regular transportation again due to car issues. We discussed an  opportunity for her to participate in the next session of [facility] ("Finding Your New Normal") support group series designed for patients after they have completed treatment, and she is already signed up for it.   Ms. Leanos was encouraged to take advantage of our many other support services programs, support groups, and/or counseling in coping with her new life as a cancer survivor after completing anti-cancer treatment.  She was offered support today through active listening and expressive supportive counseling.  Ms. Millon will report if she is unable to return to see her therapist or if it is ineffective. She was given information regarding our available services and encouraged to contact me with any questions or for help enrolling in any of our support group/programs.    A total of 50 minutes of face-to-face time was spent with this patient with greater than 50% of that time in counseling and care-coordination.   Sylvan Cheese, NP  Survivorship Program [facility] [phone]   Note: PRIMARY CARE PROVIDER Wynelle Fanny [phone]

## 2015-11-12 ENCOUNTER — Telehealth

## 2015-11-12 NOTE — Telephone Encounter (Signed)
Pt would like referral for ingroin toe nail.

## 2015-12-22 ENCOUNTER — Encounter: Attending: Internal Medicine | Primary: Internal Medicine

## 2015-12-22 MED ORDER — OMEPRAZOLE 20 MG PO CPDR
20 MG | ORAL_CAPSULE | Freq: Every day | ORAL | 5 refills | Status: DC
Start: 2015-12-22 — End: 2016-06-20

## 2016-01-25 ENCOUNTER — Encounter

## 2016-01-25 MED ORDER — TRAZODONE HCL 100 MG PO TABS
100 MG | ORAL_TABLET | Freq: Every evening | ORAL | 2 refills | Status: DC
Start: 2016-01-25 — End: 2016-08-09

## 2016-01-25 NOTE — Telephone Encounter (Signed)
[facility] called on refill line for refill on Trazadone 

## 2016-01-29 ENCOUNTER — Ambulatory Visit
Admit: 2016-01-29 | Discharge: 2016-01-29 | Payer: PRIVATE HEALTH INSURANCE | Attending: Internal Medicine | Primary: Internal Medicine

## 2016-01-29 ENCOUNTER — Encounter

## 2016-01-29 DIAGNOSIS — R002 Palpitations: Secondary | ICD-10-CM

## 2016-01-29 MED ORDER — ASPIRIN EC 81 MG PO TBEC
81 MG | ORAL_TABLET | Freq: Every day | ORAL | 3 refills | Status: DC
Start: 2016-01-29 — End: 2016-05-03

## 2016-01-29 MED ORDER — MUPIROCIN 2 % EX OINT
2 % | CUTANEOUS | 0 refills | Status: AC
Start: 2016-01-29 — End: 2016-02-05

## 2016-01-29 NOTE — Progress Notes (Signed)
Subjective:  Alyssa Freeman is a 35 y.o. female who presents with chief complaint of Palpitations      HPI:  Aflutter:  Patient states she has been on caffiene pills  She states she has had palpitations x 3 weeks  She states no chest pains no SOB  She is able to exercise and she stopped taking caffiene   She denies any drug use     Boil:  Patient has small boil on R breast midline  samll lesion about 1 cm no masses     Depression:  Patient states she is stable on her antidepressant  She states she is under more stress lately     Current Meds: reviewed below  Current Outpatient Prescriptions   Medication Sig Dispense Refill   . mupirocin (BACTROBAN) 2 % ointment Apply 3 times daily. 15 g 0   . aspirin EC 81 MG EC tablet Take 1 tablet by mouth daily 30 tablet 3   . traZODone (DESYREL) 100 MG tablet Take 0.5 tablets by mouth nightly 30 tablet 2   . omeprazole (PRILOSEC) 20 MG delayed release capsule Take 1 capsule by mouth Daily 30 capsule 5   . clobetasol (TEMOVATE) 0.05 % cream APPLY TWICE DAILY TO ITCHING ON HANDS AT NEEDED  2   . norethindrone (ORTHO MICRONOR) 0.35 MG tablet Take 1 tablet by mouth daily 84 tablet 4   . sertraline (ZOLOFT) 100 MG tablet TAKE 1 AND 1/2 TABLETS BY MOUTH EVERY DAY 45 tablet 11     No current facility-administered medications for this visit.          Past Medical History   Diagnosis Date   . Depression    . GERD (gastroesophageal reflux disease)    . Hypertension    . Liver disease      HCV GT 1a/1b   . Rash    . Sleep disorder      family history includes Depression in her mother; High Blood Pressure in her father and mother.   reports that she has been smoking Cigarettes.  She has a 10.00 pack-year smoking history. She has never used smokeless tobacco.    Review of Systems   Constitutional: Negative for chills and fever.   Respiratory: Negative for cough and shortness of breath.    Cardiovascular: Negative for chest pain and palpitations.   Gastrointestinal: Negative for diarrhea,  nausea and vomiting.       Objective:    Vitals:    01/29/16 0909   BP: 122/72   Weight: 193 lb (87.5 kg)   Height:  (1.575 m)     Physical Exam   Constitutional: She appears well-developed and well-nourished.   HENT:   Head: Normocephalic and atraumatic.   Mouth/Throat: No oropharyngeal exudate.   Neck: Normal range of motion.   Cardiovascular: Normal rate, regular rhythm and normal heart sounds.  Exam reveals no gallop and no friction rub.    No murmur heard.  Pulmonary/Chest: Effort normal. No respiratory distress. She has no wheezes. She has no rales.   Abdominal: Soft. Bowel sounds are normal.   Skin:   Small 1 cm boil R breast      EKG rate controlled 79 aflutter   ASSESSMENT/PLAN:    1. Palpitations  - EKG 12 Lead  - TSH without Reflex; Future  - Comprehensive Metabolic Panel; Future  - CBC; Future  - ECHO Complete 2D W Doppler W Color; Future    2. Boil  - mupirocin (  BACTROBAN) 2 % ointment; Apply 3 times daily.  Dispense: 15 g; Refill: 0    3. Depression, unspecified depression type    4. Atrial flutter, unspecified type (HCC)  - [facility]- Roosvelt Harps, MD  - aspirin EC 81 MG EC tablet; Take 1 tablet by mouth daily  Dispense: 30 tablet; Refill: 3      Goals     None          There are no discontinued medications.      FOLLOWUP      Return in about 4 weeks (around 02/26/2016).    Electronically signed by Tommy Medal, MD on 01/29/16 at 9:26 AM

## 2016-01-29 NOTE — Progress Notes (Addendum)
[facility]  Cardiology Note    DATE of SERVICE: 02/02/16  TIME of SERVICE: 1:20 PM                   Chief Complaint:  Chief Complaint   Patient presents with   ??? Atrial Fibrillation        History of Present Illness:   Alyssa Freeman is a 35 y.o. female complaining of palpitations which started about 2 weeks ago. She also started going to the gym and using caffeine tablets at about the same time. She has multiple episodes daily each lasting  A few minutes.She went to her PCP's office where an EKG done showed Afib vs Aflutter vs artifact. She denies fever chills. She has since stopped using the caffeine tablets yet her palpitations persist. Feels like she is more stressed lately as she has 2 jobs.She has  No chest pain or pressure, no SOB, no LOC, no syncope, no leg swelling, no orthopnea, no PND.       Past Medical History:  Past Medical History   Diagnosis Date   ??? Depression    ??? GERD (gastroesophageal reflux disease)    ??? Hypertension    ??? Liver disease      HCV GT 1a/1b   ??? Rash    ??? Sleep disorder         Past Surgical History  Past Surgical History   Procedure Laterality Date   ??? Cholecystectomy     ??? Femur surgery Right      with hardware placement       Family History  Family History   Problem Relation Age of Onset   ??? High Blood Pressure Mother    ??? Depression Mother    ??? High Blood Pressure Father         Social History  Social History   Substance Use Topics   ??? Smoking status: Current Every Day Smoker     Packs/day: 0.50     Years: 20.00     Types: Cigarettes   ??? Smokeless tobacco: Never Used      Comment: Patient is a 1/2 pack a day smoker and has smoked since the age of 13 years   ??? Alcohol use No      Comment: 07/12/2014 sober date        Allergies:   No Known Allergies    Medications:    Current Outpatient Prescriptions:   ???  mupirocin (BACTROBAN) 2 % ointment, Apply 3 times daily., Disp: 15 g, Rfl: 0  ???  aspirin EC 81 MG EC tablet, Take 1 tablet by mouth daily, Disp: 30 tablet,  Rfl: 3  ???  traZODone (DESYREL) 100 MG tablet, Take 0.5 tablets by mouth nightly, Disp: 30 tablet, Rfl: 2  ???  omeprazole (PRILOSEC) 20 MG delayed release capsule, Take 1 capsule by mouth Daily, Disp: 30 capsule, Rfl: 5  ???  clobetasol (TEMOVATE) 0.05 % cream, APPLY TWICE DAILY TO ITCHING ON HANDS AT NEEDED, Disp: , Rfl: 2  ???  norethindrone (ORTHO MICRONOR) 0.35 MG tablet, Take 1 tablet by mouth daily, Disp: 84 tablet, Rfl: 4  ???  sertraline (ZOLOFT) 100 MG tablet, TAKE 1 AND 1/2 TABLETS BY MOUTH EVERY DAY, Disp: 45 tablet, Rfl: 11    Review of Systems:  Review of Systems   Constitutional: Negative for chills, diaphoresis and fever.   HENT: Negative for nosebleeds.    Eyes: Negative for visual disturbance.   Respiratory:  Negative for cough, shortness of breath and wheezing.    Cardiovascular: Positive for palpitations. Negative for chest pain and leg swelling.   Gastrointestinal: Negative for abdominal pain, blood in stool, constipation, diarrhea, nausea and vomiting.   Genitourinary: Negative for hematuria.   Musculoskeletal: Negative for myalgias.   Skin: Negative for rash.   Neurological: Negative for dizziness and syncope.   Hematological: Does not bruise/bleed easily.   Psychiatric/Behavioral: Negative for dysphoric mood and suicidal ideas.        Denies Depression       Physical Examination:  Vitals:   Vitals:    01/29/16 1443   BP: 114/64   Site: Left Arm   Position: Sitting   Cuff Size: Large Adult   Pulse: 98   Weight: 193 lb 9.6 oz (87.8 kg)   Height:  (1.575 m)     Body mass index is 35.41 kg/(m^2).    Physical Exam   Constitutional: She appears well-developed.   HENT:   Head: Atraumatic.   Eyes: EOM are normal.   Neck: No JVD present.   Cardiovascular: Normal rate, regular rhythm, S1 normal, S2 normal, normal heart sounds and intact distal pulses.  Exam reveals no gallop and no friction rub.    No murmur heard.  Occasional extra beats   Pulmonary/Chest: She has no wheezes. She exhibits no tenderness.    Abdominal: She exhibits no distension and no mass. There is no tenderness. There is no rebound and no guarding.   Musculoskeletal: She exhibits no edema or tenderness.   Neurological: She is alert.   Skin: No rash noted. No erythema.   Psychiatric: She has a normal mood and affect.       Laboratory Tests:   Lab Results   Component Value Date    WBC 12.8 (H) 01/29/2016    HGB 13.5 01/29/2016    HCT 40.9 01/29/2016    MCV 87.1 01/29/2016    PLT 290 01/29/2016     Lab Results   Component Value Date    NA 139 01/29/2016    K 4.0 01/29/2016    CL 106 01/29/2016    CO2 21 01/29/2016    BUN 13 01/29/2016    CREATININE 0.71 01/29/2016    GLUCOSE 98 01/29/2016    CALCIUM 8.7 01/29/2016      Lab Results   Component Value Date    NA 139 01/29/2016    K 4.0 01/29/2016    CL 106 01/29/2016    CO2 21 01/29/2016    BUN 13 01/29/2016    CREATININE 0.71 01/29/2016    GLUCOSE 98 01/29/2016    CALCIUM 8.7 01/29/2016    PROT 7.0 01/29/2016    LABALBU 3.7 01/29/2016    BILITOT 0.2 01/29/2016    ALKPHOS 78 01/29/2016    AST 19 01/29/2016    ALT 11 (L) 01/29/2016         Lab Results   Component Value Date    CHOL 250 (A) 07/02/2015     Lab Results   Component Value Date    TRIG 88 07/02/2015     Lab Results   Component Value Date    HDL 51 07/02/2015     Lab Results   Component Value Date    LDLCHOLESTEROL 181 (A) 07/02/2015       Cardiac Tests:  ZOX:WRUEA  Rhythm   WITHIN NORMAL LIMITS  No results found for: LVEF, LVEFMODE        Assessment and  Plan:    ICD-10-CM ICD-9-CM    1. Heart palpitations R00.2 785.1    Differentials include PVC's, PAC's, versus SVT.  This may be related to her caffeine tablet consumption.   will obtain a event monitor .  Management would depend on findings.   in the meantime have asked her  Not to resume taking The caffeine tablets,.   she had blood drawn for TSH today,    also awaiting echo, ordered today by PCP.      I, Dr.Pietrolungo saw and evaluated the patient.  I personally obtained the key and critical  portions of the history and physical exam.  I reviewed the chart, the fellow's visit documentation, and discussed the patient with the fellow. I agree with the Fellow's medical decision making and have edited the note to reflect my findings and my assessment and plan.

## 2016-01-30 LAB — COMPREHENSIVE METABOLIC PANEL
ALT: 11 U/L — ABNORMAL LOW (ref 12–78)
AST: 19 U/L (ref 15–37)
Albumin,Serum: 3.7 g/dL (ref 3.4–5.0)
Alkaline Phosphatase: 78 U/L (ref 45–117)
Anion Gap: 12 NA
BUN: 13 mg/dL (ref 7–25)
CO2: 21 mmol/L (ref 21–32)
Calcium: 8.7 mg/dL (ref 8.2–10.1)
Chloride: 106 mmol/L (ref 98–109)
Creatinine: 0.71 mg/dL (ref 0.55–1.40)
EGFR IF NonAfrican American: >60 mL/min (ref >60)
Glucose: 98 mg/dL (ref 70–100)
Potassium: 4 mmol/L (ref 3.5–5.1)
Sodium: 139 mmol/L (ref 135–145)
Total Bilirubin: 0.2 mg/dL (ref 0.2–1.0)
Total Protein: 7 g/dL (ref 6.4–8.2)
eGFR African American: >60 mL/min (ref >60)

## 2016-01-30 LAB — CBC
Hematocrit: 40.9 % (ref 35.0–47.0)
Hemoglobin: 13.5 g/dL (ref 11.7–16.0)
MCH: 28.8 pg (ref 26.0–34.0)
MCHC: 33.1 % (ref 32.0–36.0)
MCV: 87.1 fL (ref 79.0–98.0)
MPV: 8.5 fL (ref 7.4–10.4)
Platelets: 290 10*3/uL (ref 140–440)
RBC: 4.7 10*6/uL (ref 3.80–5.20)
RDW: 13.5 % (ref 11.5–14.5)
WBC: 12.8 10*3/uL — ABNORMAL HIGH (ref 3.6–10.7)

## 2016-01-30 LAB — TSH: TSH: 1.98 uU/mL (ref 0.358–3.740)

## 2016-02-02 ENCOUNTER — Encounter: Payer: Self-pay | Admitting: Radiation Oncology

## 2016-02-02 ENCOUNTER — Ambulatory Visit
Admission: RE | Admit: 2016-02-02 | Discharge: 2016-02-02 | Disposition: A | Discharge status: Home / Self Care | Payer: BC Managed Care – PPO | Source: Ambulatory Visit | Attending: Radiation Oncology | Admitting: Radiation Oncology

## 2016-02-02 VITALS — BP 126/80 | HR 85 | Temp 100.1°F | Ht 65.0 in | Wt 183.7 lb

## 2016-02-02 DIAGNOSIS — C50311 Malignant neoplasm of lower-inner quadrant of right female breast: Secondary | ICD-10-CM

## 2016-02-02 NOTE — Progress Notes (Signed)
I, Dr. Sherald Balbuena saw and evaluated the patient.  I personally obtained the key and critical portions of the history and physical exam.  I reviewed the chart, the fellow's visit documentation, and discussed the patient with the fellow. I agree with the Fellow's medical decision making and have edited the note to reflect my findings and my assessment and plan.

## 2016-02-02 NOTE — Progress Notes (Signed)
  Radiation Oncology         [phone] ________________________________  Name: Kara Meadows MRN: [id number]  Date: 02/02/2016  DOB: 10/29/1981  Follow-Up Visit Note  CC: Edrick Oh, MD    ICD-9-CM ICD-10-CM   1. 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  She denies any pain in the breast area nipple discharge or bleeding. She continues on tamoxifen but has been having some issues with mild depression and is working on adjusting her effexor-xr medicine concerning this issue. She continues to work her usual schedule. Denies any swelling in her right arm. Patient has recently been started on Z-Pak for sinus infection. She is feeling better but her temperature is mildly elevated this morning.                              ALLERGIES:  is allergic to amoxicillin.  Meds: Current Outpatient Prescriptions  Medication Sig Dispense Refill  . ibuprofen (ADVIL,MOTRIN) 400 MG tablet Take 400 mg by mouth every 8 (eight) hours as needed. Maybe once a week    . Levonorgestrel 13.5 MG IUD by Intrauterine route.    . tamoxifen (NOLVADEX) 20 MG tablet Take 1 tablet (20 mg total) by mouth daily. Start September 1st 90 tablet 3  . venlafaxine XR (EFFEXOR-XR) 75 MG 24 hr capsule Take 75 mg by mouth daily with breakfast.     . azithromycin (ZITHROMAX) 250 MG tablet     . busPIRone (BUSPAR) 30 MG tablet      No current facility-administered medications for this encounter.    Physical Findings: The patient is in no acute distress. Patient is alert and oriented.  height is 5\' 5"  (1.651 m) and weight is 183 lb 11.2 oz (83.326 kg). Her oral temperature is 100.1 F (37.8 C). Her blood pressure is 126/80 and her pulse is 85. Marland Kitchen  No palpable supraclavicular or axillary  adenopathy. Lungs are clear to auscultation. The heart has a regular rhythm and rate. Examination left breast reveals no mass or nipple discharge. Examination right breast reveals mild hyperpigmentation changes. Patient continues to have some mild edema in the nipple areolar complex area but this has improved over the past 3 months. No dominant mass appreciated in the breast nipple discharge or bleeding.  Lab Findings: Lab Results  Component Value Date   WBC 5.4 04/15/2015   HGB 13.6 06/04/2015   HCT 39.8 04/15/2015   MCV 88.9 04/15/2015   PLT 185 04/15/2015    Radiographic Findings: No results found.  Impression: No evidence of recurrence on clinical exam today  Plan:  When necessary follow-up in radiation oncology. The patient will continue close follow-up with medical oncology.  ____________________________________ Gery Pray, MD

## 2016-02-02 NOTE — Progress Notes (Signed)
Kara Meadows is here for follow-up, denies any pain, reports fatigue, taking tamoxifen, pt currently on z-pack for sinus infection and has a fever of 100.1 at visit time, slightly hyperpigmentation on right breast, also reports chaffing in the nipple area  BP 126/80 mmHg  Pulse 85  Temp(Src) 100.1 F (37.8 C) (Oral)  Ht 5\' 5"  (1.651 m)  Wt 183 lb 11.2 oz (83.326 kg)  BMI 30.57 kg/m2

## 2016-02-04 LAB — EJECTION FRACTION PERCENTAGE: Left Ventricular Ejection Fraction: 65 %

## 2016-02-04 LAB — ECHOCARDIOGRAM COMPLETE 2D W DOPPLER W COLOR: Left Ventricular Ejection Fraction: 65

## 2016-02-25 ENCOUNTER — Encounter: Payer: Self-pay | Admitting: Adult Health

## 2016-02-25 ENCOUNTER — Ambulatory Visit
Admit: 2016-02-25 | Discharge: 2016-02-25 | Payer: PRIVATE HEALTH INSURANCE | Attending: Internal Medicine | Primary: Internal Medicine

## 2016-02-25 DIAGNOSIS — I4892 Unspecified atrial flutter: Secondary | ICD-10-CM

## 2016-02-25 MED ORDER — VARENICLINE TARTRATE 0.5 MG X 11 & 1 MG X 42 PO MISC
0.5 MG X 11 & 1 MG X 42 | ORAL | 0 refills | Status: DC
Start: 2016-02-25 — End: 2016-04-21

## 2016-02-25 NOTE — Progress Notes (Signed)
Subjective:  Alyssa Freeman is a 35 y.o. female who presents with chief complaint of 1 Month Follow-Up      HPI:  Aflutter:  Patient states she is doing well Echo is fine   She states she is stable no further episodes lab work was reviewed  Patient did see cardiology and got holter       Boil:  Patient states she still has small amount of pus she is expressing from her lesion         Current Meds: reviewed below  Current Outpatient Prescriptions   Medication Sig Dispense Refill   . varenicline (CHANTIX STARTING MONTH PAK) 0.5 MG X 11 & 1 MG X 42 tablet Take by mouth. 53 each 0   . aspirin EC 81 MG EC tablet Take 1 tablet by mouth daily 30 tablet 3   . traZODone (DESYREL) 100 MG tablet Take 0.5 tablets by mouth nightly 30 tablet 2   . omeprazole (PRILOSEC) 20 MG delayed release capsule Take 1 capsule by mouth Daily 30 capsule 5   . clobetasol (TEMOVATE) 0.05 % cream APPLY TWICE DAILY TO ITCHING ON HANDS AT NEEDED  2   . norethindrone (ORTHO MICRONOR) 0.35 MG tablet Take 1 tablet by mouth daily 84 tablet 4   . sertraline (ZOLOFT) 100 MG tablet TAKE 1 AND 1/2 TABLETS BY MOUTH EVERY DAY 45 tablet 11     No current facility-administered medications for this visit.          Past Medical History   Diagnosis Date   . Depression    . GERD (gastroesophageal reflux disease)    . Hypertension    . Liver disease      HCV GT 1a/1b   . Rash    . Sleep disorder      family history includes Depression in her mother; High Blood Pressure in her father and mother.   reports that she has been smoking Cigarettes.  She has a 10.00 pack-year smoking history. She has never used smokeless tobacco.    Review of Systems   Constitutional: Negative for chills and fever.   Respiratory: Negative for cough and shortness of breath.    Cardiovascular: Negative for chest pain and palpitations.   Gastrointestinal: Negative for diarrhea, nausea and vomiting.       Objective:    Vitals:    02/25/16 1320   BP: 120/78   Weight: 199 lb (90.3 kg)   Height: 5'  2" (1.575 m)     Physical Exam   Constitutional: She appears well-developed and well-nourished.   HENT:   Head: Normocephalic and atraumatic.   Mouth/Throat: No oropharyngeal exudate.   Neck: Normal range of motion.   Cardiovascular: Normal rate, regular rhythm and normal heart sounds.  Exam reveals no gallop and no friction rub.    No murmur heard.  Pulmonary/Chest: Effort normal. No respiratory distress. She has no wheezes. She has no rales.   Abdominal: Soft.       ASSESSMENT/PLAN:    1. Atrial flutter, unspecified type (HCC)    2. Boil    3. Smoking  - varenicline (CHANTIX STARTING MONTH PAK) 0.5 MG X 11 & 1 MG X 42 tablet; Take by mouth.  Dispense: 53 each; Refill: 0     New med listed  above : education and most common cautions and side effects discussed with patient     Goals     None  There are no discontinued medications.      FOLLOWUP      Return in about 6 months (around 08/27/2016).    Electronically signed by Tommy MedalMatthew Marckus Hanover, MD on 02/25/16 at 1:26 PM

## 2016-02-25 NOTE — Progress Notes (Signed)
A birthday card was mailed to the patient today on behalf of the Survivorship Program at [facility].   Gretchen Dawson, NP Survivorship Program [facility] [phone]  

## 2016-03-09 NOTE — Procedures (Signed)
[facility]                        [facility]                      [facility]                          EVENT RECORDER REPORT       PATIENT: Alyssa Freeman, Alyssa     MEDICAL RECORD #: [id number]          DATE OF SERVICE:02/03/2016     [facility]               ACCOUNT #:        [id number]     DATE OF BIRTH:    [birth year]           AGE:              34  REFERRING PHYSICIAN: Chinedu Era SkeenAngela E Igwe, MD  DICTATING FELLOW:    ---  SUPERVISING          Tyrone AppleMichael A. Corbitt Cloke, MD  PHYSICIAN:                        Electronically Authenticated                  Tyrone AppleMichael A. Nimesh Riolo, MD 03/15/2016 12:15 P    The preliminary dictation may be dictated by a Cardiology fellowship  physician and will be noted above.  The supervising physician is  responsible for editing the report and by signing the report states  the approval of the accuracy of the document.     ORDER:  Abnormal electrocardiogram.    LENGTH OF RECORDING:   02/03/16 to 03/03/16    REASON FOR THE TEST:  Abnormal electrocardiogram.    RESULTS:     1. The baseline transmission was sinus rhythm and normal.     2. On February 8 the patient felt fluttering, and the rhythm was     sinus rhythm and normal.     3. On February 10 the patient felt chest pain, and the rhythm was     sinus rhythm and normal.     4. On February 10 the patient felt a rapid heartbeat while     cleaning.  The rhythm was sinus tachycardia at 135bpm.     5. On February 10 while climbing stairs the patient felt a rapid     heartbeat.  The rhythm was sinus tachycardia at 148bpm.    ASSESSMENT:  Normal event monitor.        Casimiro NeedleMichael A. Diamon Reddinger, MD      DOD: 03/08/2016  05:33 P  MP/mlh  DOT: 03/09/2016 12:25 P  Job Number: [id number]  Document Number: [id number]  cc: Chinedu Era SkeenAngela E Igwe, MD       Carnella GuadalajaraJoseph F. Pietrolungo, DO     [address]     [address]     [place] [id number]

## 2016-03-18 NOTE — Telephone Encounter (Signed)
Pt notified.  She states that she is feeling better since stopping the caffeine tablets.

## 2016-03-18 NOTE — Telephone Encounter (Signed)
-----   Message from Chinedu Era SkeenAngela E Igwe, MD sent at 03/16/2016  9:50 AM EDT -----  Please inform patient her event monitor result was normal. No arrythmia was seen.

## 2016-04-01 ENCOUNTER — Telehealth: Payer: Self-pay | Admitting: Hematology and Oncology

## 2016-04-01 ENCOUNTER — Ambulatory Visit (HOSPITAL_BASED_OUTPATIENT_CLINIC_OR_DEPARTMENT_OTHER): Payer: BC Managed Care – PPO | Admitting: Hematology and Oncology

## 2016-04-01 ENCOUNTER — Encounter: Payer: Self-pay | Admitting: Hematology and Oncology

## 2016-04-01 VITALS — BP 143/83 | HR 84 | Temp 98.3°F | Resp 18 | Ht 65.0 in | Wt 183.6 lb

## 2016-04-01 DIAGNOSIS — Z79811 Long term (current) use of aromatase inhibitors: Secondary | ICD-10-CM | POA: Diagnosis not present

## 2016-04-01 DIAGNOSIS — C50311 Malignant neoplasm of lower-inner quadrant of right female breast: Secondary | ICD-10-CM

## 2016-04-01 DIAGNOSIS — F329 Major depressive disorder, single episode, unspecified: Secondary | ICD-10-CM

## 2016-04-01 DIAGNOSIS — Z170 Estrogen receptor positive status [ER+]: Secondary | ICD-10-CM | POA: Diagnosis not present

## 2016-04-01 NOTE — Telephone Encounter (Signed)
appt made and avs printed. Pt will contact [facility] to sch annual mammo

## 2016-04-01 NOTE — Progress Notes (Signed)
Patient Care Team: Carlos Levering, PA-C as PCP - General (Family Medicine) Erroll Luna, MD as Consulting Physician (General Surgery) Nicholas Lose, MD as Consulting Physician (Hematology and Oncology) Gery Pray, MD as Consulting Physician (Radiation Oncology) Rockwell Germany, RN as Registered Nurse Mauro Kaufmann, RN as Registered Nurse Holley Bouche, NP as Nurse Practitioner (Nurse Practitioner) Sylvan Cheese, NP as Nurse Practitioner (Hematology and Oncology)  DIAGNOSIS: Breast cancer of lower-inner quadrant of right female breast [facility])   Staging form: Breast, AJCC 7th Edition     Clinical stage from 04/15/2015: Stage IA (T1c, N0, M0) - Unsigned     Pathologic stage from 05/05/2015: Stage IIA (T2, N0, cM0) - Signed by Enid Cutter, MD on 05/12/2015       Staging comments: Staged on final lumpectomy specimen by Dr. Donato Heinz    SUMMARY OF ONCOLOGIC HISTORY:   Breast cancer of lower-inner quadrant of right female breast ([place])   04/06/2015 Mammogram Right breast: 8 x 15 mm ill-defined focal asymmetry within the LIQ   04/06/2015 Breast US Right breast: 1.5 x 0.8 x 1.4 cm irregular hypoechoic area/mass at the 4 o'clock position, 5 cm from the nipple.    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    04/30/2015 Definitive Surgery  Right breast lumpectomy/SLNB ([facility]):: IDC 2.1 cm, positive for LVI, DCIS less than 0.01 millimeters from inferior margin, 2 SLN negative, grade 3 , ER 99%, PR 100%, HER-2 negative, Ki-67  24%   04/30/2015 Pathologic Stage Stage IIA: pT2 pN0   04/30/2015 Oncotype testing Oncotype 17 (11% ROR). No chemotherapy.   06/04/2015 Surgery Margins re-excision Negative   07/14/2015 - 08/28/2015 Radiation Therapy Adjuvant XRT ([facility]): Right breast 50.4 Gy over 28 fractions; lumpectomy cavity boost 10 Gy over 5 fractions. Total dose: 60 Gy   09/14/2015 -  Anti-estrogen oral therapy Tamoxifen 20 mg daily. No ovarian suppression (low risk, pt preference). Planned duration of therapy: 5 years   10/27/2015 Survivorship Survivorship visit completed today and copy of care plan provided to patient.    CHIEF COMPLIANT: Severe depression. Tamoxifen  INTERVAL HISTORY: Kara Meadows is a 35 year old with above-mentioned history of right breast cancer currently on tamoxifen therapy she was tolerating the treatment fairly well but recently she has developed profound depression that is not getting better in spite of being on Effexor 75 mg dosage. She feels very miserable. Apart from this she denies any hot flashes or myalgias.  REVIEW OF SYSTEMS:   Constitutional: Denies fevers, chills or abnormal weight loss Eyes: Denies blurriness of vision Ears, nose, mouth, throat, and face: Denies mucositis or sore throat Respiratory: Denies cough, dyspnea or wheezes Cardiovascular: Denies palpitation, chest discomfort Gastrointestinal:  Denies nausea, heartburn or change in bowel habits Skin: Denies abnormal skin rashes Lymphatics: Denies new lymphadenopathy or easy bruising Neurological:Denies numbness, tingling or new weaknesses Behavioral/Psych: Major depression  Extremities: No lower extremity edema Breast:  denies any pain or lumps or nodules in either breasts All other systems were reviewed with the patient and are negative.  I have reviewed the past medical history, past surgical history, social history and family history with the patient and they are unchanged from previous note.  ALLERGIES:  is allergic to  amoxicillin.  MEDICATIONS:  Current Outpatient Prescriptions  Medication Sig Dispense Refill  . azithromycin (ZITHROMAX) 250 MG tablet     . busPIRone (BUSPAR) 30 MG tablet     . ibuprofen (ADVIL,MOTRIN) 400 MG tablet Take 400 mg by mouth every 8 (eight) hours as needed. Maybe once a week    . Levonorgestrel 13.5 MG IUD by Intrauterine route.    . tamoxifen (NOLVADEX) 20 MG tablet Take 1 tablet (20 mg total) by mouth daily. Start September 1st 90 tablet 3  . venlafaxine XR (EFFEXOR-XR) 75 MG 24 hr capsule Take 75 mg by mouth daily with breakfast.      No current facility-administered medications for this visit.    PHYSICAL EXAMINATION: ECOG PERFORMANCE STATUS: 1 - Symptomatic but completely ambulatory  Filed Vitals:   04/01/16 1059  BP: 143/83  Pulse: 84  Temp: 98.3 F (36.8 C)  Resp: 18   Filed Weights   04/01/16 1059  Weight: 183 lb 9.6 oz (83.28 kg)    GENERAL:alert, no distress and comfortable SKIN: skin color, texture, turgor are normal, no rashes or significant lesions EYES: normal, Conjunctiva are pink and non-injected, sclera clear OROPHARYNX:no exudate, no erythema and lips, buccal mucosa, and tongue normal  NECK: supple, thyroid normal size, non-tender, without nodularity LYMPH:  no palpable lymphadenopathy in the cervical, axillary or inguinal LUNGS: clear to auscultation and percussion with normal breathing effort HEART: regular rate & rhythm and no murmurs and no lower extremity edema ABDOMEN:abdomen soft, non-tender and normal bowel sounds MUSCULOSKELETAL:no cyanosis of digits and no clubbing  NEURO: alert & oriented x 3 with fluent speech, no focal motor/sensory deficits EXTREMITIES: No lower extremity edema  LABORATORY DATA:  I have reviewed the data as listed   Chemistry      Component Value Date/Time   NA 141 04/15/2015 1203   K 4.2 04/15/2015 1203   CO2 24 04/15/2015 1203   BUN 12.9 04/15/2015 1203   CREATININE 0.8 04/15/2015 1203        Component Value Date/Time   CALCIUM 8.9 04/15/2015 1203   ALKPHOS 58 04/15/2015 1203   AST 18 04/15/2015 1203   ALT 16 04/15/2015 1203   BILITOT <0.20 04/15/2015 1203       Lab Results  Component Value Date   WBC 5.4 04/15/2015   HGB 13.6 06/04/2015   HCT 39.8 04/15/2015   MCV 88.9 04/15/2015   PLT 185 04/15/2015   NEUTROABS 2.6 04/15/2015   ASSESSMENT & PLAN:  Breast cancer of lower-inner quadrant of right female breast Right breast lumpectomy 04/30/2015: Invasive ductal carcinoma 2.1 cm, positive for lymphovascular invasion, DCIS less than 0.01 millimeters from inferior margin, 2 sentinel nodes negative, grade 3 , ER 99%, PR 100%, HER-2 negative, Ki-67 24%, Oncotype 17 (11% ROR), status post radiation completed 08/28/2015, started tamoxifen 09/15/2015  Tamoxifen toxicities: 1. Severe depression: We discussed decreasing the dosage of tamoxifen to 10 mg daily. After 3 months and 10 mg she will slowly increase it to 20 mg every other day alternating with 10 mg dosage. If she cannot tolerate that she will go back to 10 mg daily. She is currently on Effexor. Denies any hot flashes or myalgias.  If in spite of lowering the dosage if symptoms continue with the depression, we can switch her to ovarian suppression  along with anastrozole therapy.  Breast Cancer Surveillance: 1. Breast exam 04/01/2016: No palpable abnormalities of breast cancer 2. Mammogram will need to be scheduled for this month.   I stressed on the importance of compliance with tamoxifen. Patient understands that she cannot pregnant on tamoxifen has to use contraception. Return to clinic in 6 months for follow-up.   Orders Placed This Encounter  Procedures  . MM DIAG BREAST TOMO BILATERAL    Standing Status: Future     Number of Occurrences:      Standing Expiration Date: 06/01/2017    Order Specific Question:  Reason for Exam (SYMPTOM  OR DIAGNOSIS REQUIRED)    Answer:  Annual mammogram with Right breast cancer     Order Specific Question:  Is the patient pregnant?    Answer:  No    Order Specific Question:  Preferred imaging location?    Answer:  [facility]   The patient has a good understanding of the overall plan. she agrees with it. she will call with any problems that may develop before the next visit here.   Rulon Eisenmenger, MD 04/01/2016

## 2016-04-01 NOTE — Assessment & Plan Note (Signed)
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 Tolerating tamoxifen extremely well without any side effects. Denies any hot flashes or myalgias.  Breast Cancer Surveillance: 1. Breast exam 04/01/2016: No palpable abnormalities of breast cancer 2. Mammogram will need to be scheduled for this month.   I stressed on the importance of compliance with tamoxifen. Patient understands that she cannot pregnant on tamoxifen has to use contraception. Return to clinic in 6 months for follow-up.

## 2016-04-11 ENCOUNTER — Ambulatory Visit
Admission: RE | Admit: 2016-04-11 | Discharge: 2016-04-11 | Disposition: A | Discharge status: Home / Self Care | Payer: BC Managed Care – PPO | Source: Ambulatory Visit | Attending: Hematology and Oncology | Admitting: Hematology and Oncology

## 2016-04-11 DIAGNOSIS — C50311 Malignant neoplasm of lower-inner quadrant of right female breast: Secondary | ICD-10-CM

## 2016-04-21 ENCOUNTER — Encounter

## 2016-04-22 MED ORDER — CHANTIX STARTING MONTH PAK 0.5 MG X 11 & 1 MG X 42 PO TABS
0.511142 MG X 11 & 1 MG X 42 | ORAL_TABLET | ORAL | 0 refills | Status: DC
Start: 2016-04-22 — End: 2016-05-03

## 2016-05-03 ENCOUNTER — Ambulatory Visit
Admit: 2016-05-03 | Discharge: 2016-05-03 | Payer: PRIVATE HEALTH INSURANCE | Attending: Internal Medicine | Primary: Internal Medicine

## 2016-05-03 DIAGNOSIS — J329 Chronic sinusitis, unspecified: Secondary | ICD-10-CM

## 2016-05-03 DIAGNOSIS — B9689 Other specified bacterial agents as the cause of diseases classified elsewhere: Secondary | ICD-10-CM

## 2016-05-03 MED ORDER — AMOXICILLIN-POT CLAVULANATE 875-125 MG PO TABS
875-125 MG | ORAL_TABLET | Freq: Two times a day (BID) | ORAL | 0 refills | Status: AC
Start: 2016-05-03 — End: 2016-05-10

## 2016-05-03 MED ORDER — FLUCONAZOLE 150 MG PO TABS
150 MG | ORAL_TABLET | Freq: Once | ORAL | 0 refills | Status: AC
Start: 2016-05-03 — End: 2016-05-03

## 2016-05-03 MED ORDER — IPRATROPIUM BROMIDE 0.03 % NA SOLN
0.03 % | Freq: Three times a day (TID) | NASAL | 3 refills | Status: DC
Start: 2016-05-03 — End: 2016-07-21

## 2016-05-03 NOTE — Progress Notes (Signed)
Subjective:  Alyssa Freeman is a 35 y.o. female who presents with chief complaint of Sinusitis      HPI:  Sinusitis: patient c/o sinus congestion  + fever + chills  She states green drainage     Allergic rhinitis: patient state she has runny nose itching eyes     Depression:  Patient presents for follow up of depression:   Condition  Stable:   Yes     No   Patient is compliant with medications:  Yes   No   Side effects:  Yes   No   Suicide ideations:   Yes   No   Homicide ideations  Yes   No     Mood:   Better     Worse    Same   Sleep changes:     Better    Worse     Same    Fatigue:  Yes   No   Problems with Concentration:  Yes   No   Anxiety:    Better    Worse     Same      None  Weight loss:  Yes   No         Current Meds: reviewed below  Current Outpatient Prescriptions   Medication Sig Dispense Refill   . amoxicillin-clavulanate (AUGMENTIN) 875-125 MG per tablet Take 1 tablet by mouth 2 times daily for 7 days 14 tablet 0   . fluconazole (DIFLUCAN) 150 MG tablet Take 1 tablet by mouth once for 1 dose 2 tablet 0   . ipratropium (ATROVENT) 0.03 % nasal spray 2 sprays by Nasal route 3 times daily 1 Bottle 3   . traZODone (DESYREL) 100 MG tablet Take 0.5 tablets by mouth nightly 30 tablet 2   . omeprazole (PRILOSEC) 20 MG delayed release capsule Take 1 capsule by mouth Daily 30 capsule 5   . clobetasol (TEMOVATE) 0.05 % cream APPLY TWICE DAILY TO ITCHING ON HANDS AT NEEDED  2   . norethindrone (ORTHO MICRONOR) 0.35 MG tablet Take 1 tablet by mouth daily 84 tablet 4   . sertraline (ZOLOFT) 100 MG tablet TAKE 1 AND 1/2 TABLETS BY MOUTH EVERY DAY 45 tablet 11     No current facility-administered medications for this visit.          Past Medical History:   Diagnosis Date   . Depression    . GERD (gastroesophageal reflux disease)    . Hypertension    . Liver disease     HCV GT 1a/1b   . Rash    . Sleep disorder      family history includes Depression in her  mother; High Blood Pressure in her father and mother; No Known Problems in her brother and sister.   reports that she has been smoking Cigarettes.  She has a 10.00 pack-year smoking history. She has never used smokeless tobacco.    Review of Systems   Constitutional: Negative for chills and fever.   Respiratory: Negative for cough and shortness of breath.    Cardiovascular: Negative for chest pain and palpitations.   Gastrointestinal: Negative for diarrhea, nausea and vomiting.       Objective:  Wt Readings from Last 3 Encounters:   05/03/16 195 lb (88.5 kg)   02/25/16 199 lb (90.3 kg)   01/29/16 193 lb 9.6 oz (87.8 kg)       Vitals:    05/03/16 1434   BP: 118/80   Weight: 195 lb (  88.5 kg)   Height: 5\' 2"  (1.575 m)       Physical Exam   Constitutional: She appears well-developed and well-nourished.   HENT:   Head: Normocephalic and atraumatic.   Mouth/Throat: No oropharyngeal exudate.   + sinusitis   Eyes: Conjunctivae are normal. Pupils are equal, round, and reactive to light.   Neck: Normal range of motion. Neck supple.   Cardiovascular: Normal rate, regular rhythm and normal heart sounds.  Exam reveals no gallop and no friction rub.    No murmur heard.  Pulmonary/Chest: Effort normal. No respiratory distress. She has no wheezes. She has no rales.   Abdominal: Soft. Bowel sounds are normal.   Skin: Skin is warm and dry.       Laboratory Tests: reviewed  Lab Results   Component Value Date    NA 139 01/29/2016    K 4.0 01/29/2016    CL 106 01/29/2016    CO2 21 01/29/2016    BUN 13 01/29/2016    CREATININE 0.71 01/29/2016    GLUCOSE 98 01/29/2016    CALCIUM 8.7 01/29/2016    PROT 7.0 01/29/2016    LABALBU 3.7 01/29/2016    BILITOT 0.2 01/29/2016    ALKPHOS 78 01/29/2016    AST 19 01/29/2016    ALT 11 (L) 01/29/2016           ASSESSMENT/PLAN:    1. Sinusitis, bacterial  - amoxicillin-clavulanate (AUGMENTIN) 875-125 MG per tablet; Take 1 tablet by mouth 2 times daily for 7 days  Dispense: 14 tablet; Refill: 0    2.  Seasonal allergic rhinitis due to pollen  - ipratropium (ATROVENT) 0.03 % nasal spray; 2 sprays by Nasal route 3 times daily  Dispense: 1 Bottle; Refill: 3    3. Depression, unspecified depression type  New med listed  above : education and most common cautions and side effects discussed with patient and patient demonstrates understanding    Goals     None          Medications Discontinued During This Encounter   Medication Reason   . aspirin EC 81 MG EC tablet Patient Choice   . CHANTIX STARTING MONTH PAK 0.5 MG X 11 & 1 MG X 42 tablet Patient Choice         FOLLOWUP      Return in about 4 months (around 09/03/2016).    Electronically signed by Tommy MedalMatthew Ulus Hazen, MD on 05/03/16 at 2:47 PM

## 2016-05-25 MED ORDER — SERTRALINE HCL 100 MG PO TABS
100 | ORAL_TABLET | Freq: Two times a day (BID) | ORAL | 11 refills | Status: DC
Start: 2016-05-25 — End: 2017-06-15

## 2016-05-25 NOTE — Telephone Encounter (Signed)
Pt asking if you can increase her Zoloft dose, she is currently taking 150mg  QD, but she has been very emotional lately and cannot control her temper, she tried increasing her dose to 200mg  for the last 3 days and it seemed to help some.

## 2016-06-03 ENCOUNTER — Encounter: Attending: Internal Medicine | Primary: Internal Medicine

## 2016-06-20 MED ORDER — OMEPRAZOLE 20 MG PO CPDR
20 MG | ORAL_CAPSULE | Freq: Every day | ORAL | 5 refills | Status: DC
Start: 2016-06-20 — End: 2016-12-14

## 2016-07-07 NOTE — Telephone Encounter (Signed)
I called the pt to reschedule canceled appointment.  Pt said that she was not at home and didn't have her calender with her and that she will call back to schedule

## 2016-07-21 ENCOUNTER — Ambulatory Visit
Admit: 2016-07-21 | Discharge: 2016-07-21 | Payer: PRIVATE HEALTH INSURANCE | Attending: Internal Medicine | Primary: Internal Medicine

## 2016-07-21 DIAGNOSIS — L2082 Flexural eczema: Secondary | ICD-10-CM

## 2016-07-21 MED ORDER — BETAMETHASONE DIPROPIONATE 0.05 % EX OINT
0.05 % | CUTANEOUS | 1 refills | Status: DC
Start: 2016-07-21 — End: 2017-03-09

## 2016-07-21 NOTE — Patient Instructions (Signed)
Stop all dairy  Next 2 weeks   ,  Metamucil twice a day

## 2016-07-21 NOTE — Progress Notes (Signed)
Subjective:  Alyssa Freeman is a 35 y.o. female who presents with chief complaint of Rash (Bilateral Elbows and Knee) and Irritable Bowel Syndrome      HPI:  Eczema Lesions: patient states she has new lesions on her elbows and knees  She is scrubbing with scubber in shower      Diarrhea :  Patient states she needs to go to bathroom with diarrhea when she eats  X 3 months      Depression:  Patient states she is taking zoloft and is much better with 200 mg daily  Less anxiety  No suicide no homicide  No side effects     Current Meds: reviewed below  Current Outpatient Prescriptions   Medication Sig Dispense Refill   . betamethasone dipropionate (DIPROLENE) 0.05 % ointment Apply topically daily. 15 g 1   . omeprazole (PRILOSEC) 20 MG delayed release capsule Take 1 capsule by mouth Daily 30 capsule 5   . sertraline (ZOLOFT) 100 MG tablet Take 1 tablet by mouth 2 times daily 60 tablet 11   . traZODone (DESYREL) 100 MG tablet Take 0.5 tablets by mouth nightly 30 tablet 2   . norethindrone (ORTHO MICRONOR) 0.35 MG tablet Take 1 tablet by mouth daily 84 tablet 4     No current facility-administered medications for this visit.          Past Medical History:   Diagnosis Date   . Depression    . GERD (gastroesophageal reflux disease)    . Hypertension    . Liver disease     HCV GT 1a/1b   . Rash    . Sleep disorder      family history includes Depression in her mother; High Blood Pressure in her father and mother; No Known Problems in her brother and sister.   reports that she has been smoking Cigarettes.  She has a 10.00 pack-year smoking history. She has never used smokeless tobacco.    Review of Systems   Constitutional: Negative for chills and fever.   Respiratory: Negative for cough and shortness of breath.    Cardiovascular: Negative for chest pain and palpitations.   Gastrointestinal: Negative for diarrhea, nausea and vomiting.       Objective:  Wt Readings from Last 3 Encounters:   07/21/16 196 lb (88.9 kg)   05/03/16  195 lb (88.5 kg)   02/25/16 199 lb (90.3 kg)       Vitals:    07/21/16 1341   BP: 120/78   Weight: 196 lb (88.9 kg)   Height: 5\' 2"  (1.575 m)       Physical Exam   Constitutional: She appears well-developed and well-nourished.   HENT:   Head: Normocephalic and atraumatic.   Mouth/Throat: No oropharyngeal exudate.   Eyes: Conjunctivae are normal. Pupils are equal, round, and reactive to light.   Neck: Normal range of motion. Neck supple.   Cardiovascular: Normal rate, regular rhythm and normal heart sounds.  Exam reveals no gallop and no friction rub.    No murmur heard.  Pulmonary/Chest: Effort normal. No respiratory distress. She has no wheezes. She has no rales.   Abdominal: Soft. Bowel sounds are normal.   Skin: Skin is warm and dry.   + eczema lesions elbows and knees red small < 2 cm          ASSESSMENT/PLAN:    1. Flexural eczema  - betamethasone dipropionate (DIPROLENE) 0.05 % ointment; Apply topically daily.  Dispense: 15 g; Refill: 1  -  [facility] - Leda Min, MD    2. Functional diarrhea    3. Dysthymia  New med listed  above : education and most common cautions and side effects discussed with patient and patient demonstrates understanding    Goals     None          Medications Discontinued During This Encounter   Medication Reason   . clobetasol (TEMOVATE) 0.05 % cream Therapy completed   . ipratropium (ATROVENT) 0.03 % nasal spray Patient Choice         FOLLOWUP      Return in about 3 months (around 10/21/2016).    Electronically signed by Tommy Medal, MD on 07/21/16 at 1:49 PM

## 2016-08-09 ENCOUNTER — Encounter

## 2016-08-09 MED ORDER — TRAZODONE HCL 100 MG PO TABS
100 MG | ORAL_TABLET | Freq: Every evening | ORAL | 2 refills | Status: DC
Start: 2016-08-09 — End: 2017-01-25

## 2016-08-09 NOTE — Telephone Encounter (Signed)
Pt needs refills on Trazodone.

## 2016-08-31 ENCOUNTER — Encounter: Attending: Internal Medicine | Primary: Internal Medicine

## 2016-09-01 ENCOUNTER — Other Ambulatory Visit: Payer: Self-pay | Admitting: Hematology and Oncology

## 2016-09-01 DIAGNOSIS — C50311 Malignant neoplasm of lower-inner quadrant of right female breast: Secondary | ICD-10-CM

## 2016-09-16 NOTE — Telephone Encounter (Signed)
I called pt and scheduled overdue f/u w/ Dr. Donetta Potts  10/07/16  2:30

## 2016-09-30 ENCOUNTER — Ambulatory Visit (HOSPITAL_BASED_OUTPATIENT_CLINIC_OR_DEPARTMENT_OTHER): Payer: BC Managed Care – PPO | Admitting: Hematology and Oncology

## 2016-09-30 ENCOUNTER — Encounter: Payer: Self-pay | Admitting: Hematology and Oncology

## 2016-09-30 DIAGNOSIS — N951 Menopausal and female climacteric states: Secondary | ICD-10-CM

## 2016-09-30 DIAGNOSIS — Z79811 Long term (current) use of aromatase inhibitors: Secondary | ICD-10-CM | POA: Diagnosis not present

## 2016-09-30 DIAGNOSIS — F329 Major depressive disorder, single episode, unspecified: Secondary | ICD-10-CM

## 2016-09-30 DIAGNOSIS — Z170 Estrogen receptor positive status [ER+]: Principal | ICD-10-CM

## 2016-09-30 DIAGNOSIS — C50311 Malignant neoplasm of lower-inner quadrant of right female breast: Secondary | ICD-10-CM | POA: Diagnosis not present

## 2016-09-30 NOTE — Progress Notes (Signed)
Patient Care Team: Carlos Levering, PA-C as PCP - General (Family Medicine) Erroll Luna, MD as Consulting Physician (General Surgery) Nicholas Lose, MD as Consulting Physician (Hematology and Oncology) Gery Pray, MD as Consulting Physician (Radiation Oncology) Rockwell Germany, RN as Registered Nurse Mauro Kaufmann, RN as Registered Nurse Holley Bouche, NP as Nurse Practitioner (Nurse Practitioner) Sylvan Cheese, NP as Nurse Practitioner (Hematology and Oncology)  DIAGNOSIS: Breast cancer of lower-inner quadrant of right female breast [facility])   Staging form: Breast, AJCC 7th Edition   - Clinical stage from 04/15/2015: Stage IA (T1c, N0, M0) - Unsigned   - Pathologic stage from 05/05/2015: Stage IIA (T2, N0, cM0) - Signed by Enid Cutter, MD on 05/12/2015         Staging comments: Staged on final lumpectomy specimen by Dr. Donato Heinz  SUMMARY OF ONCOLOGIC HISTORY:   Breast cancer of lower-inner quadrant of right female breast ([place])   04/06/2015 Mammogram    Right breast: 8 x 15 mm ill-defined focal asymmetry within the LIQ      04/06/2015 Breast US    Right breast: 1.5 x 0.8 x 1.4 cm irregular hypoechoic area/mass at the 4 o'clock position, 5 cm from the nipple.       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       04/30/2015 Definitive Surgery     Right breast lumpectomy/SLNB ([facility]):: IDC 2.1 cm, positive for LVI, DCIS less than 0.01 millimeters from inferior margin, 2 SLN  negative, grade 3 , ER 99%, PR 100%, HER-2 negative, Ki-67 24%      04/30/2015 Pathologic Stage    Stage IIA: pT2 pN0      04/30/2015 Oncotype testing    Oncotype 17 (11% ROR). No chemotherapy.      06/04/2015 Surgery    Margins re-excision Negative      07/14/2015 - 08/28/2015 Radiation Therapy    Adjuvant XRT ([facility]): Right breast 50.4 Gy over 28 fractions; lumpectomy cavity boost 10 Gy over 5 fractions. Total dose: 60 Gy      09/14/2015 -  Anti-estrogen oral therapy    Tamoxifen 20 mg daily. No ovarian suppression (low risk, pt preference). Planned duration of therapy: 5 years      10/27/2015 Survivorship    Survivorship visit completed today and copy of care plan provided to patient.       CHIEF COMPLIANT: Follow-up on tamoxifen, worsening depression  INTERVAL HISTORY: Gurpreet Mariani Runyon is a 35 year old with above-mentioned history of right breast cancer treated with lumpectomy adjuvant radiation and currently on tamoxifen 10 mg daily. She could not tolerate the 20 mg tamoxifen. She was on Paxil for depression but we had to take her off because of tamoxifen therapy. Effexor did not help as well as some other alternatives that she was prescribed. She is extremely distressed and needs help with her emotional state. Her work has also become very complicated. There  are multiple work-related problems as she works for [employer]. REVIEW OF SYSTEMS:   Constitutional: Denies fevers, chills or abnormal weight loss Eyes: Denies blurriness of vision Ears, nose, mouth, throat, and face: Denies mucositis or sore throat Respiratory: Denies cough, dyspnea or wheezes Cardiovascular: Denies palpitation, chest discomfort Gastrointestinal:  Denies nausea, heartburn or change in bowel habits Skin: Denies abnormal skin rashes Lymphatics: Denies new lymphadenopathy or easy bruising Neurological:Denies numbness, tingling or new weaknesses Behavioral/Psych: Worsening depression Extremities: No lower extremity  edema Breast:  denies any pain or lumps or nodules in either breasts All other systems were reviewed with the patient and are negative.  I have reviewed the past medical history, past surgical history, social history and family history with the patient and they are unchanged from previous note.  ALLERGIES:  is allergic to amoxicillin.  MEDICATIONS:  Current Outpatient Prescriptions  Medication Sig Dispense Refill  . azithromycin (ZITHROMAX) 250 MG tablet     . busPIRone (BUSPAR) 30 MG tablet     . ibuprofen (ADVIL,MOTRIN) 400 MG tablet Take 400 mg by mouth every 8 (eight) hours as needed. Maybe once a week    . Levonorgestrel 13.5 MG IUD by Intrauterine route.    . tamoxifen (NOLVADEX) 20 MG tablet TAKE 1 TABLET(20 MG) BY MOUTH DAILY. START SEPTEMBER 1 ST 90 tablet 0  . venlafaxine XR (EFFEXOR-XR) 75 MG 24 hr capsule Take 75 mg by mouth daily with breakfast.      No current facility-administered medications for this visit.     PHYSICAL EXAMINATION: ECOG PERFORMANCE STATUS: 1 - Symptomatic but completely ambulatory  Vitals:   09/30/16 0838  BP: 125/75  Pulse: 79  Resp: 18  Temp: 98.3 F (36.8 C)   Filed Weights   09/30/16 0838  Weight: 182 lb 12.8 oz (82.9 kg)    GENERAL:alert, no distress and comfortable SKIN: skin color, texture, turgor are normal, no rashes or significant lesions EYES: normal, Conjunctiva are pink and non-injected, sclera clear OROPHARYNX:no exudate, no erythema and lips, buccal mucosa, and tongue normal  NECK: supple, thyroid normal size, non-tender, without nodularity LYMPH:  no palpable lymphadenopathy in the cervical, axillary or inguinal LUNGS: clear to auscultation and percussion with normal breathing effort HEART: regular rate & rhythm and no murmurs and no lower extremity edema ABDOMEN:abdomen soft, non-tender and normal bowel sounds MUSCULOSKELETAL:no cyanosis of digits and no clubbing  NEURO: alert & oriented x 3 with fluent speech, no focal  motor/sensory deficits EXTREMITIES: No lower extremity edema  LABORATORY DATA:  I have reviewed the data as listed   Chemistry      Component Value Date/Time   NA 141 04/15/2015 1203   K 4.2 04/15/2015 1203   CO2 24 04/15/2015 1203   BUN 12.9 04/15/2015 1203   CREATININE 0.8 04/15/2015 1203      Component Value Date/Time   CALCIUM 8.9 04/15/2015 1203   ALKPHOS 58 04/15/2015 1203   AST 18 04/15/2015 1203   ALT 16 04/15/2015 1203   BILITOT <0.20 04/15/2015 1203       Lab Results  Component Value Date   WBC 5.4 04/15/2015   HGB 13.6 06/04/2015   HCT 39.8 04/15/2015   MCV 88.9 04/15/2015   PLT 185 04/15/2015   NEUTROABS 2.6 04/15/2015     ASSESSMENT & PLAN:  Breast cancer of lower-inner quadrant of right female breast Right breast lumpectomy 04/30/2015: Invasive ductal carcinoma 2.1 cm, positive for lymphovascular invasion, DCIS less than 0.01 millimeters from inferior margin, 2 sentinel  nodes negative, grade 3 , ER 99%, PR 100%, HER-2 negative, Ki-67 24%, Oncotype 17 (11% ROR), status post radiation completed 08/28/2015, started tamoxifen 09/15/2015  Tamoxifen toxicities: 1. Severe depression: In spite of only 10 mg of tamoxifen, her depression has continued to be significant. She is also experiencing severe hot flashes. Plan: 1. Stop tamoxifen 2. start Paxil (after seeing her psychiatrist) 3. I offered starting her on ovarian suppression with Zoladex monthly along with anastrozole 1 mg daily. I discussed with her that this would have no interaction with her preferred antidepressant which is Paxil and may help decrease her emotional roller coaster. I discussed with her that there are risks of myalgias and osteoporosis with this combination. She will need to start off calcium and vitamin D.   Breast Cancer Surveillance: 1. Breast exam 09/30/2016: No palpable abnormalities of breast cancer 2. Mammogram 04/11/2016: Benign, breast density category C   return to clinic in  3 months for follow-up. Patient will call us to inform us of her decision to start Zoladex and anastrozole.  No orders of the defined types were placed in this encounter.  The patient has a good understanding of the overall plan. she agrees with it. she will call with any problems that may develop before the next visit here.   Rulon Eisenmenger, MD 09/30/16

## 2016-09-30 NOTE — Assessment & Plan Note (Signed)
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 Severe depression: We discussed decreasing the dosage of tamoxifen to 10 mg daily. After 3 months and 10 mg she will slowly increase it to 20 mg every other day alternating with 10 mg dosage. If she cannot tolerate that she will go back to 10 mg daily. She is currently on Effexor. Denies any hot flashes or myalgias.  If in spite of lowering the dosage if symptoms continue with the depression, we can switch her to ovarian suppression along with anastrozole therapy.  Breast Cancer Surveillance: 1. Breast exam 09/30/2016: No palpable abnormalities of breast cancer 2. Mammogram 04/11/2016: Benign, breast density category C  I stressed on the importance of compliance with tamoxifen. Patient understands that she cannot pregnant on tamoxifen has to use contraception. Return to clinic in 6 months for follow-up.

## 2016-10-07 ENCOUNTER — Encounter: Attending: Internal Medicine | Primary: Internal Medicine

## 2016-10-25 ENCOUNTER — Ambulatory Visit
Admit: 2016-10-25 | Discharge: 2016-10-25 | Payer: PRIVATE HEALTH INSURANCE | Attending: Advanced Practice Midwife | Primary: Internal Medicine

## 2016-10-25 DIAGNOSIS — Z01419 Encounter for gynecological examination (general) (routine) without abnormal findings: Secondary | ICD-10-CM

## 2016-10-25 LAB — POCT WET PREP
Hyphal Yeast: NEGATIVE
Trichomonas, UA: NEGATIVE
Wet Prep: ABNORMAL

## 2016-10-25 MED ORDER — METRONIDAZOLE 500 MG PO TABS
500 MG | ORAL_TABLET | Freq: Two times a day (BID) | ORAL | 0 refills | Status: AC
Start: 2016-10-25 — End: 2016-11-01

## 2016-10-25 NOTE — Progress Notes (Signed)
Chief Complaint   Patient presents with   ??? Gynecologic Exam     Here for annual exam.        Patient's last menstrual period was 10/01/2016.     History:    Past Medical History:   Diagnosis Date   ??? Depression    ??? GERD (gastroesophageal reflux disease)    ??? Hypertension    ??? Liver disease     HCV GT 1a/1b   ??? Rash    ??? Sleep disorder        Past Surgical History:   Procedure Laterality Date   ??? CHOLECYSTECTOMY     ??? FEMUR SURGERY Right     with hardware placement       Family History   Problem Relation Age of Onset   ??? High Blood Pressure Mother    ??? Depression Mother    ??? High Blood Pressure Father    ??? No Known Problems Sister    ??? No Known Problems Brother        Social History     Social History   ??? Marital status: Single     Spouse name: N/A   ??? Number of children: N/A   ??? Years of education: N/A     Social History Main Topics   ??? Smoking status: Current Every Day Smoker     Packs/day: 0.50     Years: 20.00     Types: Cigarettes   ??? Smokeless tobacco: Never Used      Comment: Patient is a 1/2 pack a day smoker and has smoked since the age of 13 years   ??? 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 Sexual activity: Yes     Partners: Male      Comment: withdrawl      Other Topics Concern   ??? None     Social History Narrative   ??? None       Obstetric History    G4   P1   T1   P0   A3   L0     SAB0   TAB0   Ectopic0   Molar0   Multiple0   Live Births0       # Outcome Date GA Lbr Len/2nd Weight Sex Delivery Anes PTL Lv   4 Term 07/12/97    M Vag-Spont      3 AB            2 AB            1 AB                   Allergies:    No Known Allergies    Medications:    Current Outpatient Prescriptions on File Prior to Visit   Medication Sig Dispense Refill   ??? traZODone (DESYREL) 100 MG tablet Take 0.5 tablets by mouth nightly 30 tablet 2   ??? omeprazole (PRILOSEC) 20 MG delayed release capsule Take 1 capsule by mouth Daily 30  capsule 5   ??? sertraline (ZOLOFT) 100 MG tablet Take 1 tablet by mouth 2 times daily 60 tablet 11   ??? betamethasone dipropionate (DIPROLENE) 0.05 % ointment Apply topically daily. 15 g 1   ??? norethindrone (ORTHO MICRONOR) 0.35 MG tablet Take 1 tablet by mouth daily 84 tablet  4     No current facility-administered medications on file prior to visit.        HPI:  Last pap:dut today  Last mammogram:  Colonoscopy:    Menses:stopped POP 3 weeks ago and no menses yet. Did have spotting and bleeding on this pill.     Contraception:withdrawl method    Breast issues:No  Vaginal issues:No  Bladder issues:No    Sleep:meds for sleep    Anxiety/depression:Yes - on meds for this.     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   Body mass index is 35.96 kg/m??.    Review of Systems   Constitutional: Negative for activity change and fatigue.   Respiratory: Negative for chest tightness and shortness of breath.    Cardiovascular: Positive for palpitations. Negative for chest pain.   Gastrointestinal: Positive for diarrhea. Negative for blood in stool and constipation.        Has diarrhea daily. No blood.    Genitourinary: Negative for dysuria and urgency.   Musculoskeletal: Negative for arthralgias, back pain and myalgias.   Skin: Negative for rash.   Neurological: Negative for dizziness, numbness and headaches.   Psychiatric/Behavioral: Negative for dysphoric mood and sleep disturbance. The patient is not nervous/anxious.        Physical Exam   Constitutional: She is oriented to person, place, and time. She appears well-developed and well-nourished. No distress.   HENT:   Head: Normocephalic.   Neck: No tracheal deviation present. No thyroid mass and no thyromegaly present.   Cardiovascular: Normal rate, regular rhythm and normal heart sounds.       No systolic murmur is  present    No diastolic murmur is present   Pulmonary/Chest: Breath sounds normal. No respiratory distress. Right breast exhibits no inverted nipple, no mass, no nipple discharge, no skin change and no tenderness. Left breast exhibits no inverted nipple, no mass, no nipple discharge, no skin change and no tenderness.   Abdominal: Soft. She exhibits no distension and no mass. There is no tenderness.   Genitourinary: Uterus normal. There is no tenderness or lesion on the right labia. There is no tenderness or lesion on the left labia. Cervix exhibits no motion tenderness. Right adnexum displays no tenderness and no fullness. Left adnexum displays no tenderness and no fullness. No erythema or tenderness in the vagina. Vaginal discharge found.   Lymphadenopathy:        Right: No inguinal adenopathy present.        Left: No inguinal adenopathy present.   Neurological: She is alert and oriented to person, place, and time.   Skin: Skin is warm and dry.   Psychiatric: She has a normal mood and affect. Her speech is normal and behavior is normal.   Nursing note and vitals reviewed.    BP 133/74    Pulse 98    Ht 5\' 3"  (1.6 m)    Wt 203 lb (92.1 kg)    LMP 10/01/2016    BMI 35.96 kg/m??     Assessment/Plan    1. Well woman exam with routine gynecological exam  PAP SMEAR   2. Screening for STD (sexually transmitted disease)  C. Trachomatis / N. Gonorrhoeae, DNA   3. Bacterial vaginosis  C. Trachomatis / N. Gonorrhoeae,  DNA    POCT Wet Prep       Perform monthly self breast exam.   Continue good diet with emphasis on fruits and vegetables and lean meats.  Increase exercise.   Consider Vitamin D supplement in the winter - 1000 IU Vitamin D 3 per day.  Will treat bacterial vaginosis with Flagyl 500 mg BID for 7 days  Will precert nexplanon and have her back for placement.   Follow up in one year for annual exam.

## 2016-10-25 NOTE — Telephone Encounter (Signed)
Pt called to inform me that the Nexplanon is a covered benefit with her insurance.  Appt scheduled for 11/14/16 with Dr. Mendise/HCG on Friday 11/17

## 2016-10-26 ENCOUNTER — Encounter: Attending: Internal Medicine | Primary: Internal Medicine

## 2016-10-26 LAB — C. TRACHOMATIS / N. GONORRHOEAE, DNA
C. trachomatis DNA: NOT DETECTED
NEISSERIA GONORRHOEAE, DNA: NOT DETECTED

## 2016-11-01 LAB — HPV, HIGH RISK
HPV, Genotype 16: NOT DETECTED NA
HPV, Genotype 18: NOT DETECTED NA
Other HR HPV Genotypes: NOT DETECTED NA

## 2016-11-01 LAB — PAP SMEAR

## 2016-11-08 ENCOUNTER — Encounter

## 2016-11-09 NOTE — Telephone Encounter (Signed)
Order placed

## 2016-11-10 ENCOUNTER — Encounter

## 2016-11-10 LAB — HCG, QUANTITATIVE, PREGNANCY: hCG Quant: <1 m[IU]/mL (ref <3)

## 2016-11-11 ENCOUNTER — Encounter: Primary: Internal Medicine

## 2016-11-14 ENCOUNTER — Ambulatory Visit
Admit: 2016-11-14 | Discharge: 2016-11-14 | Payer: PRIVATE HEALTH INSURANCE | Attending: Obstetrics & Gynecology | Primary: Internal Medicine

## 2016-11-14 DIAGNOSIS — Z30017 Encounter for initial prescription of implantable subdermal contraceptive: Secondary | ICD-10-CM

## 2016-11-14 MED ORDER — ETONOGESTREL 68 MG SC IMPL
68 MG | Freq: Once | SUBCUTANEOUS | Status: AC
Start: 2016-11-14 — End: 2016-11-14
  Administered 2016-11-14: 16:00:00 68 mg via SUBCUTANEOUS

## 2016-11-14 NOTE — Progress Notes (Signed)
Nexplanon Insertion      Procedure:    The patient was positioned comfortably on our procedure table. She was consented earlier in the appointment and the procedure risk and complications were reviewed. She was marked on L side.   A sterile prep and drape was completed and 3ml of 2% Lidocaine for local anesthetic was utilized. The nexplanon rod was noted within the applicator.  The Nexplanon was inserted per protocol.  Placement was confirmed by palpating the device by me and the patient. A sterile dressing with a pressure wrap was applied. The patient tolerated the procedure well.        Assessment:  1. Nexplanon insertion         Plan:    Formal restrictions were discussed in detail. She is to notify our office if any swelling, redness, temperature, or limb restriction or numbness. No baths, Pools or Lakes for 2 days. Showers are allowed in 24 hours. Bandage may be removed in 24hrs.  She may take Tylenol or Motrin for pain.    F/U PRN.

## 2016-11-28 NOTE — Telephone Encounter (Signed)
S: Pt. Had a nexplanon placed 2 weeks ago and now her skin around where the implant is located is discolored.  B: Nexplanon placed on 11/20.  A: Pt. States the insertion site of nexplanon appears normal, but the skin around were the nexplanon is currently situated is red and yellow. She denies warmth at the site, but states it is tender. She denies drainage from area, denies fevers.  R: Pt. Offered an appt. Today but declines due to work schedule. [facility]  [facility]    Reason for Disposition  ??? Implant site looks infected (spreading redness, red streak, or pus) and NO fever    Protocols used: CONTRACEPTION - IMPLANT SYMPTOMS AND QUESTIONS-ADULT-[place]

## 2016-11-29 ENCOUNTER — Other Ambulatory Visit: Payer: Self-pay

## 2016-11-29 ENCOUNTER — Telehealth: Payer: Self-pay

## 2016-11-29 MED ORDER — ANASTROZOLE 1 MG PO TABS: 1.0000 mg | ORAL_TABLET | Freq: Every day | ORAL | 0 refills | Status: DC

## 2016-11-29 NOTE — Telephone Encounter (Signed)
Spoke with pt regarding her decision to proceed with zoladex injection and starting on anastrozole. Discussed with Dr.Gudena and will place order for pt to come in and have injections done. Pt states that she is available to come in this week. Will send scheduling message to make her an appt for her injections.

## 2016-11-30 ENCOUNTER — Telehealth: Payer: Self-pay | Admitting: Hematology and Oncology

## 2016-11-30 ENCOUNTER — Other Ambulatory Visit: Payer: Self-pay | Admitting: Hematology and Oncology

## 2016-11-30 ENCOUNTER — Ambulatory Visit
Admit: 2016-11-30 | Discharge: 2016-11-30 | Payer: PRIVATE HEALTH INSURANCE | Attending: Obstetrics & Gynecology | Primary: Internal Medicine

## 2016-11-30 DIAGNOSIS — T81509A Unspecified complication of foreign body accidentally left in body following unspecified procedure, initial encounter: Secondary | ICD-10-CM

## 2016-11-30 MED ORDER — METRONIDAZOLE 500 MG PO TABS
500 MG | ORAL_TABLET | Freq: Two times a day (BID) | ORAL | 0 refills | Status: AC
Start: 2016-11-30 — End: 2016-12-07

## 2016-11-30 NOTE — Progress Notes (Signed)
Chief Complaint   Patient presents with   ??? Wound Infection     check nexplanon site       No LMP recorded. Patient has had an implant.     History:    Past Medical History:   Diagnosis Date   ??? Depression    ??? GERD (gastroesophageal reflux disease)    ??? Hypertension    ??? Liver disease     HCV GT 1a/1b   ??? Rash    ??? Sleep disorder        Past Surgical History:   Procedure Laterality Date   ??? CHOLECYSTECTOMY     ??? FEMUR SURGERY Right     with hardware placement       Family History   Problem Relation Age of Onset   ??? High Blood Pressure Mother    ??? Depression Mother    ??? High Blood Pressure Father    ??? No Known Problems Sister    ??? No Known Problems Brother        Social History     Social History   ??? Marital status: Single     Spouse name: N/A   ??? Number of children: N/A   ??? Years of education: N/A     Social History Main Topics   ??? Smoking status: Current Every Day Smoker     Packs/day: 0.50     Years: 20.00     Types: Cigarettes   ??? Smokeless tobacco: Never Used      Comment: Patient is a 1/2 pack a day smoker and has smoked since the age of 13 years   ??? 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 Sexual activity: Yes     Partners: Male      Comment: withdrawl      Other Topics Concern   ??? Not on file     Social History Narrative   ??? No narrative on file       Allergies:    No Known Allergies    Medications:    Current Outpatient Prescriptions on File Prior to Visit   Medication Sig Dispense Refill   ??? traZODone (DESYREL) 100 MG tablet Take 0.5 tablets by mouth nightly 30 tablet 2   ??? betamethasone dipropionate (DIPROLENE) 0.05 % ointment Apply topically daily. 15 g 1   ??? omeprazole (PRILOSEC) 20 MG delayed release capsule Take 1 capsule by mouth Daily 30 capsule 5   ??? sertraline (ZOLOFT) 100 MG tablet Take 1 tablet by mouth 2 times daily 60 tablet 11   ??? norethindrone (ORTHO MICRONOR) 0.35 MG tablet Take 1 tablet by  mouth daily 84 tablet 4     No current facility-administered medications on file prior to visit.        HPI: pt with nexplanon insert 2 weeks ago with skin irritation at the site. Is not at the insert site but over the body of the rod.  No bleeding no fevers, did have some bruising , bna dslight + itch. Also bv has retrurned gets it after unprotected sex.     HPI    ROS:    Review of Systems   Constitutional: Negative.    Gastrointestinal: Negative.    Genitourinary: Positive for vaginal discharge. Negative for decreased urine volume, difficulty urinating, dyspareunia, dysuria, enuresis, flank pain, frequency,  genital sores, hematuria, menstrual problem, pelvic pain, urgency, vaginal bleeding and vaginal pain.   Skin: Positive for color change.        See hpi   Allergic/Immunologic: Negative.        Physical exam:    BP 120/77    Pulse 90    Wt 204 lb (92.5 kg)    BMI 36.14 kg/m??     Physical Exam   Constitutional: She is oriented to person, place, and time. She appears well-developed and well-nourished. No distress.   Neurological: She is alert and oriented to person, place, and time.   Skin: She is not diaphoretic.        Psychiatric: She has a normal mood and affect. Her behavior is normal. Judgment and thought content normal.       Assessment and Plan:    Alyssa Freeman was seen today for wound infection.    Diagnoses and all orders for this visit:    Post-op foreign body, initial encounter  To try tylenol and hydrocrtison, to call if worsens  BV (bacterial vaginosis)    Other orders  -     metroNIDAZOLE (FLAGYL) 500 MG tablet; Take 1 tablet by mouth 2 times daily for 7 days        No Follow-up on file.

## 2016-11-30 NOTE — Telephone Encounter (Signed)
sw pt to confirm 12/8 appt date/time per LOS

## 2016-12-02 ENCOUNTER — Ambulatory Visit (HOSPITAL_BASED_OUTPATIENT_CLINIC_OR_DEPARTMENT_OTHER): Payer: BC Managed Care – PPO

## 2016-12-02 ENCOUNTER — Telehealth: Payer: Self-pay | Admitting: Hematology and Oncology

## 2016-12-02 VITALS — BP 119/80 | HR 100 | Temp 98.2°F | Resp 18

## 2016-12-02 DIAGNOSIS — Z170 Estrogen receptor positive status [ER+]: Principal | ICD-10-CM

## 2016-12-02 DIAGNOSIS — Z5111 Encounter for antineoplastic chemotherapy: Secondary | ICD-10-CM | POA: Diagnosis not present

## 2016-12-02 DIAGNOSIS — C50311 Malignant neoplasm of lower-inner quadrant of right female breast: Secondary | ICD-10-CM | POA: Diagnosis not present

## 2016-12-02 MED ORDER — GOSERELIN ACETATE 3.6 MG ~~LOC~~ IMPL
3.6000 mg | DRUG_IMPLANT | Freq: Once | SUBCUTANEOUS | Status: AC
  Administered 2016-12-02: 3.6 mg via SUBCUTANEOUS
  Filled 2016-12-02: qty 3.6

## 2016-12-02 NOTE — Telephone Encounter (Signed)
Injection scheduled with next follow up visit. 12/02/16. A copy of the AVS report and appointment schedule was given to the patient, per 12/02/16.

## 2016-12-02 NOTE — Patient Instructions (Signed)
Goserelin injection What is this medicine? GOSERELIN (GOE se rel in) is similar to a hormone found in the body. It lowers the amount of sex hormones that the body makes. Men will have lower testosterone levels and women will have lower estrogen levels while taking this medicine. In men, this medicine is used to treat prostate cancer; the injection is either given once per month or once every 12 weeks. A once per month injection (only) is used to treat women with endometriosis, dysfunctional uterine bleeding, or advanced breast cancer. This medicine may be used for other purposes; ask your health care provider or pharmacist if you have questions. COMMON BRAND NAME(S): Zoladex What should I tell my health care provider before I take this medicine? They need to know if you have any of these conditions (some only apply to women): -diabetes -heart disease or previous heart attack -high blood pressure -high cholesterol -kidney disease -osteoporosis or low bone density -problems passing urine -spinal cord injury -stroke -tobacco smoker -an unusual or allergic reaction to goserelin, hormone therapy, other medicines, foods, dyes, or preservatives -pregnant or trying to get pregnant -breast-feeding How should I use this medicine? This medicine is for injection under the skin. It is given by a health care professional in a hospital or clinic setting. Men receive this injection once every 4 weeks or once every 12 weeks. Women will only receive the once every 4 weeks injection. Talk to your pediatrician regarding the use of this medicine in children. Special care may be needed. Overdosage: If you think you have taken too much of this medicine contact a poison control center or emergency room at once. NOTE: This medicine is only for you. Do not share this medicine with others. What if I miss a dose? It is important not to miss your dose. Call your doctor or health care professional if you are unable to  keep an appointment. What may interact with this medicine? -female hormones like estrogen -herbal or dietary supplements like black cohosh, chasteberry, or DHEA -female hormones like testosterone -prasterone This list may not describe all possible interactions. Give your health care provider a list of all the medicines, herbs, non-prescription drugs, or dietary supplements you use. Also tell them if you smoke, drink alcohol, or use illegal drugs. Some items may interact with your medicine. What should I watch for while using this medicine? Visit your doctor or health care professional for regular checks on your progress. Your symptoms may appear to get worse during the first weeks of this therapy. Tell your doctor or healthcare professional if your symptoms do not start to get better or if they get worse after this time. Your bones may get weaker if you take this medicine for a long time. If you smoke or frequently drink alcohol you may increase your risk of bone loss. A family history of osteoporosis, chronic use of drugs for seizures (convulsions), or corticosteroids can also increase your risk of bone loss. Talk to your doctor about how to keep your bones strong. This medicine should stop regular monthly menstration in women. Tell your doctor if you continue to menstrate. Women should not become pregnant while taking this medicine or for 12 weeks after stopping this medicine. Women should inform their doctor if they wish to become pregnant or think they might be pregnant. There is a potential for serious side effects to an unborn child. Talk to your health care professional or pharmacist for more information. Do not breast-feed an infant while taking   this medicine. Men should inform their doctors if they wish to father a child. This medicine may lower sperm counts. Talk to your health care professional or pharmacist for more information. What side effects may I notice from receiving this  medicine? Side effects that you should report to your doctor or health care professional as soon as possible: -allergic reactions like skin rash, itching or hives, swelling of the face, lips, or tongue -bone pain -breathing problems -changes in vision -chest pain -feeling faint or lightheaded, falls -fever, chills -pain, swelling, warmth in the leg -pain, tingling, numbness in the hands or feet -signs and symptoms of low blood pressure like dizziness; feeling faint or lightheaded, falls; unusually weak or tired -stomach pain -swelling of the ankles, feet, hands -trouble passing urine or change in the amount of urine -unusually high or low blood pressure -unusually weak or tired Side effects that usually do not require medical attention (report to your doctor or health care professional if they continue or are bothersome): -change in sex drive or performance -changes in breast size in both males and females -changes in emotions or moods -headache -hot flashes -irritation at site where injected -loss of appetite -skin problems like acne, dry skin -vaginal dryness This list may not describe all possible side effects. Call your doctor for medical advice about side effects. You may report side effects to FDA at 1-800-FDA-1088. Where should I keep my medicine? This drug is given in a hospital or clinic and will not be stored at home. NOTE: This sheet is a summary. It may not cover all possible information. If you have questions about this medicine, talk to your doctor, pharmacist, or health care provider.  2017 Elsevier/Gold Standard (2014-02-18 11:10:35)  

## 2016-12-07 NOTE — Telephone Encounter (Signed)
Reason for Disposition  ??? Applying cream or ointment and it causes severe itch, burning, or pain    Protocols used: RASH OR REDNESS - LOCALIZED-ADULT-OH    Left inner upper arm w Nexplanon , inserted 11/14/16  per Dr Modena JanskyMendise  Site with continued irritation and discomfort when left alone  Pressure from clothing bothers it.   Reddened and irritated, no drainage , does not appear infected  Skin around the Rod appears normal, but skin over long surface of Rod remains irritated.     Appt for evaluation, scheduled with dr Conley Canalyeropoli [place] 12/16/16 (patient is off work that day)

## 2016-12-14 MED ORDER — OMEPRAZOLE 20 MG PO CPDR
20 | ORAL_CAPSULE | Freq: Every day | ORAL | 5 refills | Status: DC
Start: 2016-12-14 — End: 2017-06-15

## 2016-12-16 ENCOUNTER — Ambulatory Visit
Admit: 2016-12-16 | Discharge: 2016-12-16 | Payer: PRIVATE HEALTH INSURANCE | Attending: Obstetrics & Gynecology | Primary: Internal Medicine

## 2016-12-16 DIAGNOSIS — T81509A Unspecified complication of foreign body accidentally left in body following unspecified procedure, initial encounter: Secondary | ICD-10-CM

## 2016-12-16 MED ORDER — CEPHALEXIN 500 MG PO CAPS
500 MG | ORAL_CAPSULE | Freq: Two times a day (BID) | ORAL | 0 refills | Status: AC
Start: 2016-12-16 — End: 2016-12-23

## 2016-12-16 MED ORDER — FLUCONAZOLE 150 MG PO TABS
150 MG | ORAL_TABLET | Freq: Once | ORAL | 0 refills | Status: AC
Start: 2016-12-16 — End: 2016-12-16

## 2016-12-16 NOTE — Progress Notes (Signed)
Tera PartridgeRachel Bleiler  12/16/2016              35 y.o.    Chief Complaint   Patient presents with   ??? Arm Pain     nexplanon inserted on 11/14/16 by Mendise       HPI:  HPI  35 y.o. G4P1030 here to follow up on Nexplanon insertion. Inserted in the left medial upper extremity 11/201/7 by Dr. Modena JanskyMendise. She notices a skin rash develop over the distal end ( furtherest from insertion site). Saw Dr. Vanessa RalphsMonte and told to apply hydrocortisone. Since then a scab has fallen off, there is an opening, and it is tender. No drainage. No fevers.     PMH:    has a past medical history of Depression; GERD (gastroesophageal reflux disease); Hypertension; Liver disease; Rash; and Sleep disorder.    REVIEW OF SYSTEMS:  Review of Systems   Constitutional: Negative for fever.   Skin: Positive for rash and wound.       PHYSICAL EXAM   BP 120/79    Pulse 86    Wt 200 lb (90.7 kg)    BMI 35.43 kg/m??   Body mass index is 35.43 kg/m??.    Physical Exam   Constitutional: She is oriented to person, place, and time. She appears well-developed and well-nourished. She is active.   Pulmonary/Chest: Effort normal.   Neurological: She is alert and oriented to person, place, and time.   Skin: Skin is dry and intact.   Nexplanon palpable intact. Near the distal end ( opposite from insertion site) there is about a dime size opening, erythema surrounding and very tender. No drainage.    Psychiatric: She has a normal mood and affect. Her speech is normal and behavior is normal.       ASSESSMENT/PLAN  [name] was seen today for arm pain.    Diagnoses and all orders for this visit:    Post-op foreign body, initial encounter  -     cephALEXin (KEFLEX) 500 MG capsule; Take 1 capsule by mouth 2 times daily for 7 days  -     fluconazole (DIFLUCAN) 150 MG tablet; Take 1 tablet by mouth once for 1 dose      Return in about 1 week (around 12/23/2016) for recheck.

## 2016-12-23 ENCOUNTER — Ambulatory Visit
Admit: 2016-12-23 | Discharge: 2016-12-23 | Payer: PRIVATE HEALTH INSURANCE | Attending: Obstetrics & Gynecology | Primary: Internal Medicine

## 2016-12-23 DIAGNOSIS — Z3046 Encounter for surveillance of implantable subdermal contraceptive: Secondary | ICD-10-CM

## 2016-12-23 NOTE — Progress Notes (Signed)
35 y.o.  12/23/2016    Chief Complaint   Patient presents with   ??? Follow-up       HPI:  Alyssa PartridgeRachel Freeman is a 35 y.o. female is here to follow up from reaction/infection to Nexplanon. She had been seen x 2 since insertion. Last seen 1 week ago and trial of antibiotics. The opening had closed a bit but there is still a skin rash. We decided to remove the device ? forgein body reaction.  She plans on using POP for contraception d/t tobacco use.      Past Medical History:   Diagnosis Date   ??? Depression    ??? GERD (gastroesophageal reflux disease)    ??? Hypertension    ??? Liver disease     HCV GT 1a/1b   ??? Rash    ??? Sleep disorder      Past Surgical History:   Procedure Laterality Date   ??? CHOLECYSTECTOMY     ??? FEMUR SURGERY Right     with hardware placement     Family History   Problem Relation Age of Onset   ??? High Blood Pressure Mother    ??? Depression Mother    ??? High Blood Pressure Father    ??? No Known Problems Sister    ??? No Known Problems Brother      Social History   Substance Use Topics   ??? Smoking status: Current Every Day Smoker     Packs/day: 0.50     Years: 20.00     Types: Cigarettes   ??? Smokeless tobacco: Never Used      Comment: Patient is a 1/2 pack a day smoker and has smoked since the age of 13 years   ??? Alcohol use No      Comment: 07/12/2014 sober date       MEDS:  Current Outpatient Prescriptions on File Prior to Visit   Medication Sig Dispense Refill   ??? omeprazole (PRILOSEC) 20 MG delayed release capsule Take 1 capsule by mouth Daily 30 capsule 5   ??? traZODone (DESYREL) 100 MG tablet Take 0.5 tablets by mouth nightly 30 tablet 2   ??? betamethasone dipropionate (DIPROLENE) 0.05 % ointment Apply topically daily. 15 g 1   ??? sertraline (ZOLOFT) 100 MG tablet Take 1 tablet by mouth 2 times daily 60 tablet 11   ??? cephALEXin (KEFLEX) 500 MG capsule Take 1 capsule by mouth 2 times daily for 7 days 14 capsule 0   ??? norethindrone (ORTHO MICRONOR) 0.35 MG tablet Take 1 tablet by mouth daily 84 tablet 4     No  current facility-administered medications on file prior to visit.        ALLERGY:  Allergies as of 12/23/2016   ??? (No Known Allergies)       OB History   Gravida Para Term Preterm AB Living   4 1 1   3      SAB TAB Ectopic Molar Multiple Live Births                    # Outcome Date GA Lbr Len/2nd Weight Sex Delivery Anes PTL Lv   4 Term 07/12/97    M Vag-Spont      3 AB            2 AB            1 AB  PHYSICAL EXAM :   Vitals:    12/23/16 1427   BP: 118/75   Pulse: 99   Weight: 201 lb (91.2 kg)   Height: 5\' 3"  (1.6 m)     CONSTITUTIONAL: Well nourished, well-developed no acute distress  RESPIRATORY: Normal effort   SKIN: Intact, dry Implant palpable in medial aspect of the upper left arm.   NEUROLOGICAL: no gross motor or sensory deficits noted. Marland Kitchen.   PSYCHIATRIC Normal mood and affect, A&O x3.    PROCEDURE:  No LMP recorded. Patient has had an implant.  The patient was positioned comfortably on our procedure table. She was consented earlier in the appointment and the procedure risk and complications were reviewed. A sterile prep and drape was completed and 1 ml of 2% lidocaine with epi was used for local anesthetic. A small skin incision was made just beyond the proximal tip of the insert and utilizing blunt dissection the contraceptive implant was grasped and removed. A  steri-strip and bandaid was applied. The patient tolerated the procedure well.     ASSESSMENT/PLAN:  Alyssa Freeman was seen today for follow-up.    Diagnoses and all orders for this visit:    Nexplanon removal  -     PR REMOVAL DRUG IMPLANT DEVICE      Return if symptoms worsen or fail to improve.  She is to notify our office if any swelling, redness, temperature, or limb restriction or numbness.Showers are allowed in 36 hours. She may take Tylenol for any pain.

## 2016-12-28 NOTE — Assessment & Plan Note (Signed)
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 Severe depression: We discussed decreasing the dosage of tamoxifen to 10 mg daily. After 3 months and 10 mg she will slowly increase it to 20 mg every other day alternating with 10 mg dosage. If she cannot tolerate that she will go back to 10 mg daily. She is currently on Effexor. Denies any hot flashes or myalgias.  If in spite of lowering the dosage if symptoms continue with the depression, we can switch her to ovarian suppression along with anastrozole therapy.  Breast Cancer Surveillance: 1. Breast exam 09/30/2016: No palpable abnormalities of breast cancer 2. Mammogram 04/11/2016: Benign, breast density category C  I stressed on the importance of compliance with tamoxifen. Patient understands that she cannot pregnant on tamoxifen has to use contraception. Return to clinic in 1 yr for follow-up.

## 2016-12-29 ENCOUNTER — Ambulatory Visit (HOSPITAL_BASED_OUTPATIENT_CLINIC_OR_DEPARTMENT_OTHER): Payer: BC Managed Care – PPO | Admitting: Hematology and Oncology

## 2016-12-29 ENCOUNTER — Encounter: Payer: Self-pay | Admitting: Hematology and Oncology

## 2016-12-29 ENCOUNTER — Ambulatory Visit (HOSPITAL_BASED_OUTPATIENT_CLINIC_OR_DEPARTMENT_OTHER): Payer: BC Managed Care – PPO

## 2016-12-29 DIAGNOSIS — Z79811 Long term (current) use of aromatase inhibitors: Secondary | ICD-10-CM | POA: Diagnosis not present

## 2016-12-29 DIAGNOSIS — Z5111 Encounter for antineoplastic chemotherapy: Secondary | ICD-10-CM | POA: Diagnosis not present

## 2016-12-29 DIAGNOSIS — Z170 Estrogen receptor positive status [ER+]: Secondary | ICD-10-CM

## 2016-12-29 DIAGNOSIS — C50311 Malignant neoplasm of lower-inner quadrant of right female breast: Secondary | ICD-10-CM

## 2016-12-29 MED ORDER — GOSERELIN ACETATE 3.6 MG ~~LOC~~ IMPL
3.6000 mg | DRUG_IMPLANT | Freq: Once | SUBCUTANEOUS | Status: AC
  Administered 2016-12-29: 3.6 mg via SUBCUTANEOUS
  Filled 2016-12-29: qty 3.6

## 2016-12-29 MED ORDER — NORETHINDRONE 0.35 MG PO TABS
0.35 | ORAL_TABLET | Freq: Every day | ORAL | 4 refills | Status: DC
Start: 2016-12-29 — End: 2017-03-16

## 2016-12-29 NOTE — Telephone Encounter (Signed)
rx sent to pharmacy

## 2016-12-29 NOTE — Telephone Encounter (Signed)
Pt was seen for nexplanon removal due to reaction.  Pt wanted to start POP, but the rx was not sent to the pharmacy.  She thinks the name was [facility]. Verified phone number, allergies, and pharmacy.

## 2016-12-29 NOTE — Progress Notes (Signed)
Patient Care Team: Carlos Levering, PA-C as PCP - General (Family Medicine) Erroll Luna, MD as Consulting Physician (General Surgery) Nicholas Lose, MD as Consulting Physician (Hematology and Oncology) Gery Pray, MD as Consulting Physician (Radiation Oncology) Rockwell Germany, RN as Registered Nurse Mauro Kaufmann, RN as Registered Nurse Holley Bouche, NP as Nurse Practitioner (Nurse Practitioner) Sylvan Cheese, NP as Nurse Practitioner (Hematology and Oncology)  DIAGNOSIS:  Encounter Diagnosis  Name Primary?  . Malignant neoplasm of lower-inner quadrant of right breast of female, estrogen receptor positive ([name])     SUMMARY OF ONCOLOGIC HISTORY:   Breast cancer of lower-inner quadrant of right female breast ([name])   04/06/2015 Mammogram    Right breast: 8 x 15 mm ill-defined focal asymmetry within the LIQ      04/06/2015 Breast US    Right breast: 1.5 x 0.8 x 1.4 cm irregular hypoechoic area/mass at the 4 o'clock position, 5 cm from the nipple.       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       04/30/2015 Definitive Surgery     Right breast lumpectomy/SLNB ([facility]):: IDC 2.1 cm, positive for LVI, DCIS less than 0.01 millimeters from inferior margin, 2 SLN negative, grade 3 , ER 99%, PR 100%, HER-2 negative, Ki-67 24%      04/30/2015 Pathologic Stage    Stage IIA: pT2 pN0      04/30/2015 Oncotype testing    Oncotype 17 (11% ROR). No chemotherapy.      06/04/2015 Surgery    Margins re-excision Negative      07/14/2015 - 08/28/2015 Radiation Therapy    Adjuvant XRT ([facility]): Right breast 50.4 Gy over 28 fractions; lumpectomy cavity boost 10 Gy over 5 fractions. Total dose: 60 Gy      09/14/2015 -  Anti-estrogen oral therapy    Tamoxifen 20 mg daily stopped October 2017. Anastrozole with Zoladex started December 2018      10/27/2015 Survivorship    Survivorship visit completed today and copy of care plan provided to patient.       CHIEF COMPLIANT: Follow-up on anastrozole with Zoladex  INTERVAL HISTORY: Kara Meadows is a 36 year old with above-mentioned history of right breast cancer treated with lumpectomy followed by adjuvant radiation. She is currently taking anastrozole with Zoladex injections. Started in December 2017. Over the past 1 month she had done quite well. She thinks that she is tolerating anastrozole fairly well. She does not have much hot flashes. We changed her from tamoxifen to the current treatment mainly because of emotional distress and depression issues. She was unable to take Prozac with tamoxifen. She started back on Prozac and appears to have slowly improved her emotional state. She and her husband are both moving to [place] forward that new jobs. She is very excited about it.  REVIEW OF SYSTEMS:   Constitutional: Denies fevers, chills or abnormal weight loss Eyes: Denies blurriness  of vision Ears, nose, mouth, throat, and face: Denies mucositis or sore throat Respiratory: Denies cough, dyspnea or wheezes Cardiovascular: Denies palpitation, chest discomfort Gastrointestinal:  Denies nausea, heartburn or change in bowel habits Skin: Denies abnormal skin rashes Lymphatics: Denies new lymphadenopathy or easy bruising Neurological:Denies numbness, tingling or new weaknesses Behavioral/Psych: Mood is stable, no new changes  Extremities: No lower extremity edema  All other systems were reviewed with the patient and  are negative.  I have reviewed the past medical history, past surgical history, social history and family history with the patient and they are unchanged from previous note.  ALLERGIES:  is allergic to amoxicillin.  MEDICATIONS:  Current Outpatient Prescriptions  Medication Sig Dispense Refill  . anastrozole (ARIMIDEX) 1 MG tablet Take 1 tablet (1 mg total) by mouth daily. 30 tablet 0  . ibuprofen (ADVIL,MOTRIN) 400 MG tablet Take 400 mg by mouth every 8 (eight) hours as needed. Maybe once a week    . Levonorgestrel 13.5 MG IUD by Intrauterine route.     No current facility-administered medications for this visit.     PHYSICAL EXAMINATION: ECOG PERFORMANCE STATUS: 1 - Symptomatic but completely ambulatory  Vitals:   12/29/16 1148  BP: 125/84  Pulse: 69  Resp: 18  Temp: 98.2 F (36.8 C)   Filed Weights   12/29/16 1148  Weight: 190 lb 11.2 oz (86.5 kg)    GENERAL:alert, no distress and comfortable SKIN: skin color, texture, turgor are normal, no rashes or significant lesions EYES: normal, Conjunctiva are pink and non-injected, sclera clear OROPHARYNX:no exudate, no erythema and lips, buccal mucosa, and tongue normal  NECK: supple, thyroid normal size, non-tender, without nodularity LYMPH:  no palpable lymphadenopathy in the cervical, axillary or inguinal LUNGS: clear to auscultation and percussion with normal breathing effort HEART: regular rate & rhythm and no murmurs and no lower extremity edema ABDOMEN:abdomen soft, non-tender and normal bowel sounds MUSCULOSKELETAL:no cyanosis of digits and no clubbing  NEURO: alert & oriented x 3 with fluent speech, no focal motor/sensory deficits EXTREMITIES: No lower extremity edema  LABORATORY DATA:  I have reviewed the data as listed   Chemistry      Component Value Date/Time   NA 141 04/15/2015 1203   K 4.2 04/15/2015 1203   CO2 24 04/15/2015 1203   BUN 12.9 04/15/2015 1203   CREATININE 0.8 04/15/2015 1203        Component Value Date/Time   CALCIUM 8.9 04/15/2015 1203   ALKPHOS 58 04/15/2015 1203   AST 18 04/15/2015 1203   ALT 16 04/15/2015 1203   BILITOT <0.20 04/15/2015 1203       Lab Results  Component Value Date   WBC 5.4 04/15/2015   HGB 13.6 06/04/2015   HCT 39.8 04/15/2015   MCV 88.9 04/15/2015   PLT 185 04/15/2015   NEUTROABS 2.6 04/15/2015    ASSESSMENT & PLAN:  Breast cancer of lower-inner quadrant of right female breast Right breast lumpectomy 04/30/2015: 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  Current treatment: Anastrozole with Zoladex started December 2018. Anastrozole toxicities: So far patient appears to be tolerating it very well. Does have some hot flashes.  Breast Cancer Surveillance: 1. Breast exam 09/30/2016: No palpable abnormalities  of breast cancer 2. Mammogram 04/11/2016: Benign, breast density category C  Patient is moving to [place] for her new job. However she would like to keep oncology care with [place]. I discussed with her that if she finds her local oncologist, it would be convenient for her. She may find a place where she can get Zoladex injections monthly. Return to clinic in 3 months for follow-up.   No orders of the defined types were placed in this encounter.  The patient has a good understanding of the overall plan. she agrees with it. she will call with any problems that may develop before the next visit here.   Rulon Eisenmenger, MD 12/29/16

## 2016-12-29 NOTE — Patient Instructions (Signed)
Goserelin injection What is this medicine? GOSERELIN (GOE se rel in) is similar to a hormone found in the body. It lowers the amount of sex hormones that the body makes. Men will have lower testosterone levels and women will have lower estrogen levels while taking this medicine. In men, this medicine is used to treat prostate cancer; the injection is either given once per month or once every 12 weeks. A once per month injection (only) is used to treat women with endometriosis, dysfunctional uterine bleeding, or advanced breast cancer. This medicine may be used for other purposes; ask your health care provider or pharmacist if you have questions. COMMON BRAND NAME(S): Zoladex What should I tell my health care provider before I take this medicine? They need to know if you have any of these conditions (some only apply to women): -diabetes -heart disease or previous heart attack -high blood pressure -high cholesterol -kidney disease -osteoporosis or low bone density -problems passing urine -spinal cord injury -stroke -tobacco smoker -an unusual or allergic reaction to goserelin, hormone therapy, other medicines, foods, dyes, or preservatives -pregnant or trying to get pregnant -breast-feeding How should I use this medicine? This medicine is for injection under the skin. It is given by a health care professional in a hospital or clinic setting. Men receive this injection once every 4 weeks or once every 12 weeks. Women will only receive the once every 4 weeks injection. Talk to your pediatrician regarding the use of this medicine in children. Special care may be needed. Overdosage: If you think you have taken too much of this medicine contact a poison control center or emergency room at once. NOTE: This medicine is only for you. Do not share this medicine with others. What if I miss a dose? It is important not to miss your dose. Call your doctor or health care professional if you are unable to  keep an appointment. What may interact with this medicine? -female hormones like estrogen -herbal or dietary supplements like black cohosh, chasteberry, or DHEA -female hormones like testosterone -prasterone This list may not describe all possible interactions. Give your health care provider a list of all the medicines, herbs, non-prescription drugs, or dietary supplements you use. Also tell them if you smoke, drink alcohol, or use illegal drugs. Some items may interact with your medicine. What should I watch for while using this medicine? Visit your doctor or health care professional for regular checks on your progress. Your symptoms may appear to get worse during the first weeks of this therapy. Tell your doctor or healthcare professional if your symptoms do not start to get better or if they get worse after this time. Your bones may get weaker if you take this medicine for a long time. If you smoke or frequently drink alcohol you may increase your risk of bone loss. A family history of osteoporosis, chronic use of drugs for seizures (convulsions), or corticosteroids can also increase your risk of bone loss. Talk to your doctor about how to keep your bones strong. This medicine should stop regular monthly menstration in women. Tell your doctor if you continue to menstrate. Women should not become pregnant while taking this medicine or for 12 weeks after stopping this medicine. Women should inform their doctor if they wish to become pregnant or think they might be pregnant. There is a potential for serious side effects to an unborn child. Talk to your health care professional or pharmacist for more information. Do not breast-feed an infant while taking   this medicine. Men should inform their doctors if they wish to father a child. This medicine may lower sperm counts. Talk to your health care professional or pharmacist for more information. What side effects may I notice from receiving this  medicine? Side effects that you should report to your doctor or health care professional as soon as possible: -allergic reactions like skin rash, itching or hives, swelling of the face, lips, or tongue -bone pain -breathing problems -changes in vision -chest pain -feeling faint or lightheaded, falls -fever, chills -pain, swelling, warmth in the leg -pain, tingling, numbness in the hands or feet -signs and symptoms of low blood pressure like dizziness; feeling faint or lightheaded, falls; unusually weak or tired -stomach pain -swelling of the ankles, feet, hands -trouble passing urine or change in the amount of urine -unusually high or low blood pressure -unusually weak or tired Side effects that usually do not require medical attention (report to your doctor or health care professional if they continue or are bothersome): -change in sex drive or performance -changes in breast size in both males and females -changes in emotions or moods -headache -hot flashes -irritation at site where injected -loss of appetite -skin problems like acne, dry skin -vaginal dryness This list may not describe all possible side effects. Call your doctor for medical advice about side effects. You may report side effects to FDA at 1-800-FDA-1088. Where should I keep my medicine? This drug is given in a hospital or clinic and will not be stored at home. NOTE: This sheet is a summary. It may not cover all possible information. If you have questions about this medicine, talk to your doctor, pharmacist, or health care provider.  2017 Elsevier/Gold Standard (2014-02-18 11:10:35)  

## 2017-01-03 ENCOUNTER — Other Ambulatory Visit: Payer: Self-pay | Admitting: Emergency Medicine

## 2017-01-03 MED ORDER — ANASTROZOLE 1 MG PO TABS: 1.0000 mg | ORAL_TABLET | Freq: Every day | ORAL | 11 refills | Status: AC

## 2017-01-18 NOTE — Telephone Encounter (Signed)
Pt. Left message on refill line requesting another rx for Flagyl.  RN attempted to call pt. To assess and schedule appointment - No VM "the subscriber you have reached is not in service."

## 2017-01-18 NOTE — Telephone Encounter (Signed)
Pt. Left second message and left alternate contact number.    [phone] Left message for pt. To call office for appointment.

## 2017-01-18 NOTE — Telephone Encounter (Signed)
Duplicate. See Telephone Encounter today's date.

## 2017-01-18 NOTE — Telephone Encounter (Signed)
Pt r/c to [facility] and notified that appt is recommended. Appt made for 01/20/17 w/ Dr. Ludwig ClarksYeropoli. Advised to call back w/ any addtl questions or concerns and agrees.

## 2017-01-20 ENCOUNTER — Ambulatory Visit
Admit: 2017-01-20 | Discharge: 2017-01-20 | Payer: PRIVATE HEALTH INSURANCE | Attending: Obstetrics & Gynecology | Primary: Internal Medicine

## 2017-01-20 DIAGNOSIS — B3749 Other urogenital candidiasis: Secondary | ICD-10-CM

## 2017-01-20 LAB — POCT WET PREP: Wet Prep: NEGATIVE

## 2017-01-20 MED ORDER — FLUCONAZOLE 150 MG PO TABS
150 MG | ORAL_TABLET | Freq: Once | ORAL | 0 refills | Status: AC
Start: 2017-01-20 — End: 2017-01-20

## 2017-01-20 NOTE — Progress Notes (Signed)
Tera PartridgeRachel Wagman  01/20/2017              36 y.o.    Chief Complaint   Patient presents with   ??? Vaginal Odor       HPI:  HPI  36 y.o. [id number] here for vaginal odor and some irritation. Started a few days ago. Not too much discharge. Not a fishy odor but just not normal. No itching. Same partner but they break up every 3 weeks or so and then get back together. Has hx of BV.     Nexplanon recently removed and started on POP ( + tob use). She is having BTB. She is interested in a consult for BTL. She has had 4 pregnancies and 1 delivery. Son is 10618/406 years old.   PMH:    has a past medical history of Depression; GERD (gastroesophageal reflux disease); Hypertension; Liver disease; Rash; and Sleep disorder.    REVIEW OF SYSTEMS:  Review of Systems   Genitourinary: Positive for vaginal bleeding and vaginal discharge. Negative for genital sores and pelvic pain.       PHYSICAL EXAM   BP 136/85    Pulse 91    Ht 5\' 3"  (1.6 m)    Wt 211 lb (95.7 kg)    BMI 37.38 kg/m??   Body mass index is 37.38 kg/m??.    Physical Exam   Constitutional: She appears well-developed and well-nourished.   Pulmonary/Chest: Effort normal.   Genitourinary: Rectum normal. There is no rash or lesion on the right labia. There is no rash or lesion on the left labia. Cervix exhibits no discharge and no friability. There is bleeding (small amount of blood in the vaginal vault ) in the vagina. No vaginal discharge found.   Genitourinary Comments: Urethra: normal  Vestibule: normal   Perineum: normal    Skin: Skin is dry and intact.   Psychiatric: She has a normal mood and affect. Her behavior is normal.     Results for orders placed or performed in visit on 01/20/17   POCT Wet Prep   Result Value Ref Range    Wet Prep neg whiff, several Rbc's     Hyphal Yeast several buds     White Blood Cells few     Trichomonas, UA none     Clue Cells, Wet Prep none        ASSESSMENT/PLAN  [name] was seen today for vaginal odor.    Diagnoses and all orders for this  visit:    Candida infection of genital region  -     POCT Wet Prep  -     fluconazole (DIFLUCAN) 150 MG tablet; Take 1 tablet by mouth once for 1 dose May repeat in 3 days if symptoms persist    Encounter for other general counseling or advice on contraception      Return for consult with TM .

## 2017-01-25 ENCOUNTER — Encounter

## 2017-01-25 MED ORDER — TRAZODONE HCL 100 MG PO TABS
100 MG | ORAL_TABLET | ORAL | 2 refills | Status: DC
Start: 2017-01-25 — End: 2017-06-01

## 2017-01-26 ENCOUNTER — Ambulatory Visit (HOSPITAL_BASED_OUTPATIENT_CLINIC_OR_DEPARTMENT_OTHER): Payer: BC Managed Care – PPO

## 2017-01-26 VITALS — BP 113/76 | HR 67 | Temp 98.5°F | Resp 18

## 2017-01-26 DIAGNOSIS — Z5111 Encounter for antineoplastic chemotherapy: Secondary | ICD-10-CM | POA: Diagnosis not present

## 2017-01-26 DIAGNOSIS — Z170 Estrogen receptor positive status [ER+]: Principal | ICD-10-CM

## 2017-01-26 DIAGNOSIS — C50311 Malignant neoplasm of lower-inner quadrant of right female breast: Secondary | ICD-10-CM | POA: Diagnosis not present

## 2017-01-26 MED ORDER — GOSERELIN ACETATE 3.6 MG ~~LOC~~ IMPL
3.6000 mg | DRUG_IMPLANT | Freq: Once | SUBCUTANEOUS | Status: AC
  Administered 2017-01-26: 3.6 mg via SUBCUTANEOUS
  Filled 2017-01-26: qty 3.6

## 2017-02-06 ENCOUNTER — Encounter

## 2017-02-06 ENCOUNTER — Institutional Professional Consult (permissible substitution)
Admit: 2017-02-06 | Discharge: 2017-02-06 | Payer: PRIVATE HEALTH INSURANCE | Attending: Obstetrics & Gynecology | Primary: Internal Medicine

## 2017-02-06 DIAGNOSIS — Z3009 Encounter for other general counseling and advice on contraception: Secondary | ICD-10-CM

## 2017-02-06 NOTE — Progress Notes (Signed)
Pt seeing me today to discuss and set up TL.  Developed a reaction to Nexplanon so had this removed.  Now on OCP's and doesn't like them so would like to have a TL.       BP 139/71    Pulse 81    Wt 205 lb (93 kg)    BMI 36.31 kg/m??     Past Medical History:   Diagnosis Date   ??? Depression    ??? GERD (gastroesophageal reflux disease)    ??? Hypertension    ??? Liver disease     HCV GT 1a/1b   ??? Rash    ??? Sleep disorder        Past Surgical History:   Procedure Laterality Date   ??? CHOLECYSTECTOMY     ??? FEMUR SURGERY Right     with hardware placement       Assessment:  1. Sterilization consult  LAPAROSCOPY TUBAL LIGATION       Plan:  1. We discussed the different types of sterilization.  Pt desires Lx Bilateral Salpingectomy.     We discussed the alternative treatments, risks, benefits, and consequences of tubal ligation. We also discussed different approaches to tubal ligation including excision of fallopian tubes, cautery, and occlusive methods.     I explained the most common risks of laparoscopic tubal ligation includes, but is not limited to, injury to the bowel, bladder, ureters, blood vessels, and nerves. We discussed the possibility of excessive blood loss, transfusion, infection and unplanned laparotomy.     The basic steps of the operation were discussed, including the use of carbon dioxide for distention of the abdominal cavity. I also explained the usual pre-operative preparation and post-operative expectations including restrictions to physical activity, driving, lifting, and sexual activity. We also discussed the use of pain medications.     She understands the permanent and non-reversible nature of the procedure. She also understands the failure rate is based upon method and her age at time of sterilization, but is typically 1-2%. She is also aware that her risk of ectopic pregnancy is higher after a tubal ligation and that this can be a life-threatening condition. She has been informed to call the office is  she misses a menstrual cycle after her sterilization.     She was allowed to ask questions freely and they were answered in detail. She no longer desires fertility and wishes to proceed with permanent sterilization.     Time spent with patient was 15 minutes with > 51% of the visit spent counseling the patient on her diagnosis.

## 2017-02-08 NOTE — Telephone Encounter (Signed)
Pt is scheduled for a LBS on 03/16/17 @ 8am @ [facility]. PAT on 03/09/17 @ 9:30am. Case # [id number]

## 2017-02-23 ENCOUNTER — Ambulatory Visit (HOSPITAL_BASED_OUTPATIENT_CLINIC_OR_DEPARTMENT_OTHER): Payer: BC Managed Care – PPO

## 2017-02-23 VITALS — BP 128/80 | HR 69 | Temp 98.0°F | Resp 20

## 2017-02-23 DIAGNOSIS — C50311 Malignant neoplasm of lower-inner quadrant of right female breast: Secondary | ICD-10-CM

## 2017-02-23 DIAGNOSIS — Z170 Estrogen receptor positive status [ER+]: Principal | ICD-10-CM

## 2017-02-23 DIAGNOSIS — Z5111 Encounter for antineoplastic chemotherapy: Secondary | ICD-10-CM | POA: Diagnosis not present

## 2017-02-23 MED ORDER — GOSERELIN ACETATE 3.6 MG ~~LOC~~ IMPL
3.6000 mg | DRUG_IMPLANT | Freq: Once | SUBCUTANEOUS | Status: AC
  Administered 2017-02-23: 3.6 mg via SUBCUTANEOUS
  Filled 2017-02-23: qty 3.6

## 2017-02-23 NOTE — Patient Instructions (Signed)
Goserelin injection What is this medicine? GOSERELIN (GOE se rel in) is similar to a hormone found in the body. It lowers the amount of sex hormones that the body makes. Men will have lower testosterone levels and women will have lower estrogen levels while taking this medicine. In men, this medicine is used to treat prostate cancer; the injection is either given once per month or once every 12 weeks. A once per month injection (only) is used to treat women with endometriosis, dysfunctional uterine bleeding, or advanced breast cancer. This medicine may be used for other purposes; ask your health care provider or pharmacist if you have questions. COMMON BRAND NAME(S): Zoladex What should I tell my health care provider before I take this medicine? They need to know if you have any of these conditions (some only apply to women): -diabetes -heart disease or previous heart attack -high blood pressure -high cholesterol -kidney disease -osteoporosis or low bone density -problems passing urine -spinal cord injury -stroke -tobacco smoker -an unusual or allergic reaction to goserelin, hormone therapy, other medicines, foods, dyes, or preservatives -pregnant or trying to get pregnant -breast-feeding How should I use this medicine? This medicine is for injection under the skin. It is given by a health care professional in a hospital or clinic setting. Men receive this injection once every 4 weeks or once every 12 weeks. Women will only receive the once every 4 weeks injection. Talk to your pediatrician regarding the use of this medicine in children. Special care may be needed. Overdosage: If you think you have taken too much of this medicine contact a poison control center or emergency room at once. NOTE: This medicine is only for you. Do not share this medicine with others. What if I miss a dose? It is important not to miss your dose. Call your doctor or health care professional if you are unable to  keep an appointment. What may interact with this medicine? -female hormones like estrogen -herbal or dietary supplements like black cohosh, chasteberry, or DHEA -female hormones like testosterone -prasterone This list may not describe all possible interactions. Give your health care provider a list of all the medicines, herbs, non-prescription drugs, or dietary supplements you use. Also tell them if you smoke, drink alcohol, or use illegal drugs. Some items may interact with your medicine. What should I watch for while using this medicine? Visit your doctor or health care professional for regular checks on your progress. Your symptoms may appear to get worse during the first weeks of this therapy. Tell your doctor or healthcare professional if your symptoms do not start to get better or if they get worse after this time. Your bones may get weaker if you take this medicine for a long time. If you smoke or frequently drink alcohol you may increase your risk of bone loss. A family history of osteoporosis, chronic use of drugs for seizures (convulsions), or corticosteroids can also increase your risk of bone loss. Talk to your doctor about how to keep your bones strong. This medicine should stop regular monthly menstration in women. Tell your doctor if you continue to menstrate. Women should not become pregnant while taking this medicine or for 12 weeks after stopping this medicine. Women should inform their doctor if they wish to become pregnant or think they might be pregnant. There is a potential for serious side effects to an unborn child. Talk to your health care professional or pharmacist for more information. Do not breast-feed an infant while taking   this medicine. Men should inform their doctors if they wish to father a child. This medicine may lower sperm counts. Talk to your health care professional or pharmacist for more information. What side effects may I notice from receiving this  medicine? Side effects that you should report to your doctor or health care professional as soon as possible: -allergic reactions like skin rash, itching or hives, swelling of the face, lips, or tongue -bone pain -breathing problems -changes in vision -chest pain -feeling faint or lightheaded, falls -fever, chills -pain, swelling, warmth in the leg -pain, tingling, numbness in the hands or feet -signs and symptoms of low blood pressure like dizziness; feeling faint or lightheaded, falls; unusually weak or tired -stomach pain -swelling of the ankles, feet, hands -trouble passing urine or change in the amount of urine -unusually high or low blood pressure -unusually weak or tired Side effects that usually do not require medical attention (report to your doctor or health care professional if they continue or are bothersome): -change in sex drive or performance -changes in breast size in both males and females -changes in emotions or moods -headache -hot flashes -irritation at site where injected -loss of appetite -skin problems like acne, dry skin -vaginal dryness This list may not describe all possible side effects. Call your doctor for medical advice about side effects. You may report side effects to FDA at 1-800-FDA-1088. Where should I keep my medicine? This drug is given in a hospital or clinic and will not be stored at home. NOTE: This sheet is a summary. It may not cover all possible information. If you have questions about this medicine, talk to your doctor, pharmacist, or health care provider.  2017 Elsevier/Gold Standard (2014-02-18 11:10:35)  

## 2017-02-27 ENCOUNTER — Encounter: Payer: Self-pay | Admitting: Hematology and Oncology

## 2017-03-08 ENCOUNTER — Telehealth: Payer: Self-pay | Admitting: Hematology and Oncology

## 2017-03-08 NOTE — Telephone Encounter (Signed)
FAXED  RECORDS TO DR [facility] RELEASE ID [id number]

## 2017-03-09 ENCOUNTER — Inpatient Hospital Stay: Admit: 2017-03-09 | Attending: Obstetrics & Gynecology | Primary: Internal Medicine

## 2017-03-09 DIAGNOSIS — Z302 Encounter for sterilization: Secondary | ICD-10-CM

## 2017-03-09 LAB — CBC
Hematocrit: 41.9 % (ref 35.0–47.0)
Hemoglobin: 14.3 g/dL (ref 11.7–16.0)
MCH: 29.6 pg (ref 26.0–34.0)
MCHC: 34 % (ref 32.0–36.0)
MCV: 87 fL (ref 79.0–98.0)
MPV: 7.7 fL (ref 7.4–10.4)
Platelets: 323 10*3/uL (ref 140–440)
RBC: 4.82 10*6/uL (ref 3.80–5.20)
RDW: 13.9 % (ref 11.5–14.5)
WBC: 10.5 10*3/uL (ref 3.6–10.7)

## 2017-03-09 LAB — COMPREHENSIVE METABOLIC PANEL
ALT: 15 U/L (ref 13–69)
AST: 27 U/L (ref 15–46)
Albumin,Serum: 4.1 g/dL (ref 3.5–5.0)
Alkaline Phosphatase: 73 U/L (ref 38–126)
Anion Gap: 8 NA
BUN: 9 mg/dL (ref 7–20)
CO2: 24 mmol/L (ref 22–30)
Calcium: 8.9 mg/dL (ref 8.4–10.2)
Chloride: 105 mmol/L (ref 98–107)
Creatinine: 0.66 mg/dL (ref 0.52–1.25)
EGFR IF NonAfrican American: >60 mL/min (ref >60)
Glucose: 112 mg/dL — ABNORMAL HIGH (ref 70–100)
Potassium: 3.9 mmol/L (ref 3.5–5.1)
Sodium: 138 mmol/L (ref 137–145)
Total Bilirubin: 0.3 mg/dL (ref 0.2–1.3)
Total Protein: 7.4 g/dL (ref 6.3–8.2)
eGFR African American: >60 mL/min (ref >60)

## 2017-03-09 LAB — PROTIME/INR & PTT
INR: 0.9 NA (ref 0.9–1.1)
Protime: 9.3 s (ref 9.0–12.0)
aPTT: 24.6 s (ref 20.0–30.5)

## 2017-03-09 NOTE — Discharge Instructions (Signed)
Follow all instructions given to you by Dr.Mendise        You may take Tylenol (Acetaminophen) if needed for pain.    No Motrin, Ibuprofen, or Advil 24 hours prior to surgery, or longer if instructed by your surgeon.     No Aleve or Naprosyn 3 days prior to surgery, or longer if instructed by your surgeon.      Please shower with an antibacterial soap (Example dial safeguard)    You will receive a reminder call the day before surgery with your Same Day Surgery arrival time.    If you have specific questions, please call your surgeon.

## 2017-03-09 NOTE — H&P (Deleted)
The clinician documented this note on the incorrect encounter. This information has been reviewed and the note has been moved to the correct visit. Please contact local medical records if there are any questions.

## 2017-03-09 NOTE — H&P (Signed)
Comprehensive PreSurgical History and Physical      Name: Alyssa Freeman MRN: [id number] DOB: Sep 26, 1981    (Age-36 y.o.)     Date of Service: Pt seen/examined on 03/09/2017     Chief Complaint:  Desires sterilization    History Of Present Illness:    36 y.o. female who we are asked to see/evaluate by Beryle Beams, MD for pre-operative evaluation prior to Procedure Notes:  LAPAROSCOPIC BILATERAL SALPINGECTOMY  GENERAL ANESTHESIA.    Patient presents with desire for sterilization. She verbalizes understanding this is irreversible.    Of note, she has history of aflutter. Reports cardiac work up was negative it cause was determined to be from caffeine. Reports palpitations - at baseline. Denies worsening in severity or frequency. Denies associated syncope or diaphoresis.     Patient denies exertional chest pain/shortness of breath.    Denies fever, chills, weakness or fatigue. Patient denies any recent illness, infections, or wounds. Denies cough, hemoptysis. Patient denies abdominal pain, nausea, vomiting, diarrhea, or constipation. Patient denies blood in the stool. Denies LE edema.       Past Medical History:        Diagnosis Date   ??? Arthritis    ??? Depression    ??? GERD (gastroesophageal reflux disease)    ??? Liver disease     HCV GT 1a/1b   ??? Rash    ??? Sleep disorder        Past Surgical History:        Procedure Laterality Date   ??? CHOLECYSTECTOMY     ??? FEMUR SURGERY Right     with hardware placement       Medications Prior to Admission:    Prior to Admission medications    Medication Sig Start Date End Date Taking? Authorizing Provider   Acetaminophen (TYLENOL ARTHRITIS PAIN PO) Take by mouth   Yes Historical Provider, MD   traZODone (DESYREL) 100 MG tablet TAKE 1/2 TABLET BY MOUTH EVERY DAY AT BEDTIME 01/25/17  Yes Tommy Medal, MD   norethindrone [facility]) 0.35 MG tablet Take 1 tablet by mouth daily 12/29/16  Yes Imelda Pillow, MD   omeprazole (PRILOSEC) 20 MG delayed release capsule  Take 1 capsule by mouth Daily 12/14/16  Yes Tommy Medal, MD   sertraline (ZOLOFT) 100 MG tablet Take 1 tablet by mouth 2 times daily 05/25/16  Yes Tommy Medal, MD       ANTICOAGULATION: No  CHRONIC STEROID USE:  No  CHRONIC NARCOTIC USE: No    Allergies:  Patient has no known allergies.    If patient has opioid allergy, is it okay to take Acetaminophen: N/A      Social History:   TOBACCO:   reports that she has been smoking Cigarettes.  She has a 10.00 pack-year smoking history. She has never used smokeless tobacco.  ETOH:   reports that she does not drink alcohol.  History   Drug Use No     Comment: past IVDU heroin/snorted cocaine/ 07/11/2014 last use        Family History:      Problem Relation Age of Onset   ??? High Blood Pressure Mother    ??? Depression Mother    ??? High Blood Pressure Father    ??? No Known Problems Sister    ??? No Known Problems Brother        REVIEW OF SYSTEMS:  Pertinent positives as noted in the HPI. All other systems reviewed and negative.  PHYSICAL EXAM:  Vitals:  BP 134/65    Pulse 87    Temp 96.1 ??F (35.6 ??C) (Temporal)    Ht 5\' 2"  (1.575 m)    Wt 209 lb 4.8 oz (94.9 kg)    LMP 02/09/2017    SpO2 97%    BMI 38.28 kg/m??   BMI Classification:  Obese (BMI 30.0-39.9)  General appearance: No apparent distress, appears stated age and cooperative.  HEENT: Normal cephalic, atraumatic without obvious deformity. Pupils equal, round, and reactive to light.  Extra ocular muscles intact. Conjunctivae/corneas clear.  Neck: No jugular venous distention. Trachea midline.  Respiratory:  Normal respiratory effort. Clear to auscultation, bilaterally without Rales/Wheezes/Rhonchi.  Cardiovascular: Regular rate and rhythm with normal S1/S2 without murmurs, rubs or gallops.  Abdomen: Soft, non-tender, non-distended with normal bowel sounds.  Musculoskeletal: No clubbing, cyanosis or edema bilaterally. Full ROM of all extremities.   Skin: Skin color, texture, turgor normal.  No rashes or lesions.  Neurologic:   Neurovascularly intact without any focal sensory/motor deficits. Cranial nerves: II-XII intact, grossly non-focal.  Psychiatric: Alert and oriented, thought content appropriate, normal insight      Labs:   Lab Results   Component Value Date    WBC few 01/20/2017    HGB 13.5 01/29/2016    HCT 40.9 01/29/2016    MCV 87.1 01/29/2016    PLT 290 01/29/2016     Lab Results   Component Value Date    NA 139 01/29/2016    K 4.0 01/29/2016    CL 106 01/29/2016    CO2 21 01/29/2016    BUN 13 01/29/2016    CREATININE 0.71 01/29/2016    GLUCOSE 98 01/29/2016    CALCIUM 8.7 01/29/2016    PROT 7.0 01/29/2016    LABALBU 3.7 01/29/2016    BILITOT 0.2 01/29/2016    ALKPHOS 78 01/29/2016    AST 19 01/29/2016    ALT 11 (L) 01/29/2016       Lee's Simple Cardiac Risk Index:  High-risk surgery (intraperitoneal, intrathoracic or suprainguinal vascular surgery): no  Coronary artery disease: no  Congestive heart failure: no  History of cerebrovascular disease: no  Insulin treatment for diabetes mellitus: no  Preoperative serum creatinine > 2.0mg /dL: unknown    Total:   Interpretation:  0 Points Class I   1 Point Class II   2 Points Class III   >=3 Points Class IV           Information from PAT significant to anesthetic history as recorded by Heath LarkKRISTY Oluwatosin Bracy, CNP  on 03/09/2017        Anesthesia Component:    Any problems with anesthesia?: Past General Anesthetic without complications  History of difficult intubation?: None reported  History of difficult IV access?: None reported  Family History of problems with anesthesia?: None noted   Risk Factors PONV:    Female: Yes (+1)  Motion sick or prior PONV: no  NonSmoker: Pt is Smoker - 0  Opiods for Procedure: Yes (+1)            Airway and Dentition Patient PAT Education - Risk Assesment      ASA Score:ASA 2 - 30<BMI<40  Mallampati  PAT assesment: 2        TMD: >4cm  Mouth Size: WNL  Neck: WNL  Dentures: None  Partials: None  Overall Dentition: Teeth intact with none loose/chipped per patient           GERD:Controlled with PPI  Frailty Risk Assessment: Not Applicable               No Risk Factors unless listed   Patient received procedure specific education prior to surgery per nursing     Tobacco:Patient instructed preoperatively not to smoke day of surgery and patient received smoking cessation counseling.    ETOH:  reports that she does not drink alcohol.  Drug:   History   Drug Use No     Comment: past IVDU heroin/snorted cocaine/ 07/11/2014 last use        Functional Capacity: (>4 METS implies low cardiovascular risk from surgery):  Climb a flight of stairs or walk up a hill (5.50 METs)    PAT Pain Score:None     Postop Pain Management Plan (Pain consult ordered?): Pain consult not indicated at this time    Discussed TAP block and patient agrees to proceed if determined appropriate by anesthesia team day of surgery. Brochure provided yes         EKG:  I have reviewed the EKG if indicated and there is no concern for ischemic changes or significant heart block.      ECHO and ZO:XWRU reviewed from 01/2016     SUMMARY:   1. Left ventricle: Systolic function is normal by the biplane method of   disks.   The estimated ejection fraction is 65%.   2. Right ventricle: Systolic function is normal.   3. No significant valve disease.   4. Normal echocardiogram.        ASSESSMENT/PLAN:  Patient is considered low   risk for this low/intermediate risk procedure/surgery (Procedure Notes:  LAPAROSCOPIC BILATERAL SALPINGECTOMY  GENERAL ANESTHESIA) with no reducible risk factors. Based on the above evaluation, the benefits of the planned procedure likely exceed the risks.  The patient is medically optimized to proceed with the planned procedure without any further cardiopulmonary testing.    1)  Desires sterilization  Procedure as above    2)  Hepatitis C,  Treated in the past by infectious disease. Patient reports Hep C is not chronic. Labs ordered per protocol.     3)  Previous substance abuse  Sober since 2015.  Patient wishes to not receive narcotics perioperatively. Verbalized she has discussed this with her surgeon as well.     4)  Aflutter  Patient reports this was noted to be from caffeine intake. EKG today demonstrates NSR. Event monitor and echo from 2017 reviewed and essentially normal.     5)  Nicotine dependence  Cessation discussed. EKG, H&H per protocol if pack year history is >40 years.         Labs Ordered:  YES - PER PAT PROTOCOL  ECG Ordered:  YES - PER PAT PROTOCOL          Electronically signed by: Heath Lark, CNP     Date: 03/09/2017 at 10:08 AM               PAT Protocol referenced includes:  1) Anesthesia Lab Protocol Orders  2) Perioperative Cardiovascular Risk Assessment  3) Anesthesia Assessment   4) Pain Assessment and Acute Pain Service Consult (if appropriate)  5) Medical Clearance/Consult from Internal Medicine ([facility])  6) Shower/Wash Order (for designated surgeries)

## 2017-03-09 NOTE — Other (Unsigned)
Patient Acct Nbr: [id number]   Primary AUTH/CERT:   Primary Insurance Company Name: [employer]  Primary Insurance Plan name: [facility] [employer]Med  Primary Insurance Group Number: [id number]  Primary Insurance Plan Type: Health  Primary Insurance Policy Number: [id number]    Secondary AUTH/CERT:   Secondary Insurance Company Name: [employer]  Secondary Insurance Plan name: [facility]  Secondary Insurance Group Number:   Secondary Insurance Plan Type: Health  Secondary Insurance Policy Number: [id number]

## 2017-03-16 ENCOUNTER — Telehealth: Payer: Self-pay

## 2017-03-16 ENCOUNTER — Inpatient Hospital Stay: Payer: PRIVATE HEALTH INSURANCE

## 2017-03-16 LAB — PREGNANCY, URINE: HCG Urine: NEGATIVE NA

## 2017-03-16 MED ORDER — FENTANYL CITRATE (PF) 100 MCG/2ML IJ SOLN
100 MCG/2ML | INTRAMUSCULAR | Status: DC | PRN
Start: 2017-03-16 — End: 2017-03-16

## 2017-03-16 MED ORDER — ONDANSETRON HCL 4 MG/2ML IJ SOLN
4 MG/2ML | Freq: Once | INTRAMUSCULAR | Status: DC | PRN
Start: 2017-03-16 — End: 2017-03-16

## 2017-03-16 MED ORDER — HYDROMORPHONE HCL 1 MG/ML IJ SOLN
1 MG/ML | INTRAMUSCULAR | Status: DC | PRN
Start: 2017-03-16 — End: 2017-03-16

## 2017-03-16 MED ORDER — CELECOXIB 400 MG PO CAPS
400 MG | Freq: Once | ORAL | Status: AC
Start: 2017-03-16 — End: 2017-03-16
  Administered 2017-03-16: 11:00:00 400 mg via ORAL

## 2017-03-16 MED ORDER — ACETAMINOPHEN 500 MG PO TABS
500 MG | ORAL | Status: AC
  Administered 2017-03-16: 11:00:00 1000 mg via ORAL

## 2017-03-16 MED ORDER — PROMETHAZINE HCL 25 MG/ML IJ SOLN
25 MG/ML | Freq: Once | INTRAMUSCULAR | Status: DC | PRN
Start: 2017-03-16 — End: 2017-03-16

## 2017-03-16 MED ORDER — BUPIVACAINE-DEXAMETHASONE (TAP) SYRINGE
Status: AC
Start: 2017-03-16 — End: 2017-03-16

## 2017-03-16 MED ORDER — ALPRAZOLAM 0.25 MG PO TBDP
0.25 | ORAL | Status: DC | PRN
Start: 2017-03-16 — End: 2017-03-16

## 2017-03-16 MED ORDER — NORMAL SALINE FLUSH 0.9 % IV SOLN
0.9 % | INTRAVENOUS | Status: DC | PRN
Start: 2017-03-16 — End: 2017-03-16

## 2017-03-16 MED ORDER — LABETALOL HCL 5 MG/ML IV SOLN
5 MG/ML | INTRAVENOUS | Status: DC | PRN
Start: 2017-03-16 — End: 2017-03-16

## 2017-03-16 MED ORDER — GABAPENTIN 300 MG PO CAPS
300 | Freq: Once | ORAL | Status: AC
Start: 2017-03-16 — End: 2017-03-16
  Administered 2017-03-16: 11:00:00 300 mg via ORAL

## 2017-03-16 MED ORDER — SODIUM CHLORIDE 0.9 % IV SOLN
0.9 % | INTRAVENOUS | Status: DC
Start: 2017-03-16 — End: 2017-03-16
  Administered 2017-03-16: 11:00:00 via INTRAVENOUS

## 2017-03-16 MED ORDER — FAMOTIDINE 20 MG PO TABS
20 MG | Freq: Once | ORAL | Status: AC
Start: 2017-03-16 — End: 2017-03-16
  Administered 2017-03-16: 11:00:00 20 mg via ORAL

## 2017-03-16 MED ORDER — HYDRALAZINE HCL 20 MG/ML IJ SOLN
20 MG/ML | INTRAMUSCULAR | Status: DC | PRN
Start: 2017-03-16 — End: 2017-03-16

## 2017-03-16 MED ORDER — LIDOCAINE HCL (PF) 1 % IJ SOLN
1 % | Freq: Once | INTRAMUSCULAR | Status: DC | PRN
Start: 2017-03-16 — End: 2017-03-16

## 2017-03-16 MED ORDER — DIPHENHYDRAMINE HCL 50 MG/ML IJ SOLN
50 MG/ML | Freq: Once | INTRAMUSCULAR | Status: DC | PRN
Start: 2017-03-16 — End: 2017-03-16

## 2017-03-16 MED ORDER — MEPERIDINE HCL 25 MG/ML IJ SOLN
25 MG/ML | INTRAMUSCULAR | Status: DC | PRN
Start: 2017-03-16 — End: 2017-03-16

## 2017-03-16 MED ORDER — NORMAL SALINE FLUSH 0.9 % IV SOLN
0.9 % | Freq: Two times a day (BID) | INTRAVENOUS | Status: DC
Start: 2017-03-16 — End: 2017-03-16

## 2017-03-16 MED ORDER — OXYCODONE HCL 5 MG PO TABS
5 MG | Freq: Once | ORAL | Status: AC
Start: 2017-03-16 — End: 2017-03-16
  Administered 2017-03-16: 12:00:00 5 mg via ORAL

## 2017-03-16 MED ORDER — IBUPROFEN 600 MG PO TABS
600 MG | ORAL_TABLET | Freq: Four times a day (QID) | ORAL | 1 refills | Status: DC | PRN
Start: 2017-03-16 — End: 2017-04-03

## 2017-03-16 MED FILL — CELECOXIB 400 MG PO CAPS: 400 MG | ORAL | Qty: 1

## 2017-03-16 MED FILL — OXYCODONE HCL 5 MG PO TABS: 5 MG | ORAL | Qty: 1

## 2017-03-16 MED FILL — GABAPENTIN 300 MG PO CAPS: 300 MG | ORAL | Qty: 1

## 2017-03-16 MED FILL — BUPIVACAINE-DEXAMETHASONE (TAP) SYRINGE: Qty: 60

## 2017-03-16 MED FILL — MAPAP 500 MG PO TABS: 500 MG | ORAL | Qty: 2

## 2017-03-16 MED FILL — FAMOTIDINE 20 MG PO TABS: 20 MG | ORAL | Qty: 1

## 2017-03-16 NOTE — Progress Notes (Signed)
Discharge information given to the patient. Patient and family verbalized understanding of information. All questions were answered before discharge. Patient ambulated, denies dizziness or nausea. Tolerating PO fluids and crackers. Vital signs are stable. Patient has changed and is being discharged home in a wheelchair with valuables.

## 2017-03-16 NOTE — Discharge Instructions (Signed)
Laparoscopic Procedure Discharge Instructions    It is normal to have vaginal bleeding following your procedure.  Please call if bleeding seems excessive or heavy.  Abdominal/pelvic pain and cramping is normal. Please call if you are experiencing severe abdominal pain.  Please call if your abdomen becomes distended or if you have vomiting that does not improve.   Please call if you develop a fever that does not respond to Tylenol or is > 101 F.  You may do stairs when you get home and may eat or drink anything you like.   Please avoid douching, tampons, and intercourse for 2 weeks.   You may shower but please avoid bathing or swimming until all incisions have healed.   You may resume all other activities when you feel ready to do so.   Please avoid driving while using narcotics if they have been prescribed to you.   If you have bandages on any incisions please remove these 24hrs after your procedure. If there is glue or steri-strips over the incisions please do not remove these until they begin to fall off on their own.     If you do not have a post-operative appointment scheduled please call the office and have this scheduled in 2-4 weeks.     If you have any other Questions please feel free to call [phone].    Dr. Modena JanskyMendise

## 2017-03-16 NOTE — Progress Notes (Signed)
Pt ambulated to bathroom, voided and +bm

## 2017-03-16 NOTE — Anesthesia Post-Procedure Evaluation (Signed)
Department of Anesthesiology                             Post Anesthesia Note    Name: Alyssa Freeman MRN: [id number] DOB: 04-Feb-1981    (Age-36 y.o.)     This note was authored with review of the notes of the PACU and/or Same day stay and/or direct communication with the patient with an anesthesia care provider.     POST  ANESTHESIA  EVALUATION    Patient Vitals for the past 2 hrs:   BP Pulse Resp SpO2   03/16/17 0900 (!) 107/59 96 16 99 %   03/16/17 0845 116/65 86 16 98 %        BP (!) 107/59    Pulse 96    Temp 97.1 ??F (36.2 ??C) (Temporal)    Resp 16    Ht 5\' 2"  (1.575 m)    Wt 209 lb (94.8 kg)    LMP 03/15/2017 (Exact Date)    SpO2 99%    BMI 38.23 kg/m??      Pain Level: 1    Vital signs: Respiratory and Cardiovascular function within normal limits (See Nursing Record)  Level of consciousness: Patient awake/ Able to participate      Return to baseline mental status: Yes Airway status: Normal   Pain controlled: Yes Dental injury: No   Nausea/Vomiting controlled: Yes Complications: no   Well hydrated: Yes      Patient Experience comments: None expressed    Electronically signed by Dyane DustmanJACOB Antwann Preziosi, MD on 03/16/2017 at 10:41 AM

## 2017-03-16 NOTE — Other (Unsigned)
Patient Acct Nbr: [id number]   Primary AUTH/CERT:   Primary Insurance Company Name: [employer]  Primary Insurance Plan name: [facility] SuperMed  Primary Insurance Group Number: [id number]  Primary Insurance Plan Type: Health  Primary Insurance Policy Number: [id number]    Secondary AUTH/CERT:   Secondary Insurance Company Name: [employer]  Secondary Insurance Plan name: [facility]  Secondary Insurance Group Number:   Secondary Insurance Plan Type: Health  Secondary Insurance Policy Number: [id number]

## 2017-03-16 NOTE — Progress Notes (Signed)
Patient arrived and ID verified. Vital signs stable. Call light in reach. Denies nausea and vomiting. Offered crackers and something to drink. Belonging returned to the patient.

## 2017-03-16 NOTE — H&P (Signed)
Pt seeing me today to discuss and set up TL.  Developed a reaction to Nexplanon so had this removed.  Now on OCP's and doesn't like them so would like to have a TL.   ??  ??  BP 139/71    Pulse 81    Wt 205 lb (93 kg)    BMI 36.31 kg/m??   ??  Past??Medical??History        Past Medical History:   Diagnosis Date   ??? Depression ??   ??? GERD (gastroesophageal reflux disease) ??   ??? Hypertension ??   ??? Liver disease ??   ?? HCV GT 1a/1b   ??? Rash ??   ??? Sleep disorder ??      ??  ??  Past??Surgical??History   Past Surgical History:   Procedure Laterality Date   ??? CHOLECYSTECTOMY ?? ??   ??? FEMUR SURGERY Right ??   ?? with hardware placement      ??  ??  Assessment:  1. Sterilization consult  LAPAROSCOPY TUBAL LIGATION   ??  ??  Plan:  1. We discussed the different types of sterilization.  Pt desires Lx Bilateral Salpingectomy.   ??  We discussed the alternative treatments, risks, benefits, and consequences of tubal ligation. We also discussed different approaches to tubal ligation including excision of fallopian tubes, cautery, and occlusive methods.   ??  I explained the most common risks of laparoscopic tubal ligation includes, but is not limited to, injury to the bowel, bladder, ureters, blood vessels, and nerves. We discussed the possibility of excessive blood loss, transfusion, infection and unplanned laparotomy.   ??  The basic steps of the operation were discussed, including the use of carbon dioxide for distention of the abdominal cavity. I also explained the usual pre-operative preparation and post-operative expectations including restrictions to physical activity, driving, lifting, and sexual activity. We also discussed the use of pain medications.   ??  She understands the permanent and non-reversible nature of the procedure. She also understands the failure rate is based upon method and her age at time of sterilization, but is typically 1-2%. She is also aware that her risk of ectopic pregnancy is higher after a tubal ligation and that  this can be a life-threatening condition. She has been informed to call the office is she misses a menstrual cycle after her sterilization.   ??  She was allowed to ask questions freely and they were answered in detail. She no longer desires fertility and wishes to proceed with permanent sterilization.

## 2017-03-16 NOTE — Op Note (Signed)
Pre-Op Diagnosis: Elective Sterilization    Post-Op Diagnosis: Same    Operative Procedure: Bilateral Salpingectomy    Surgeon: Dr. Modena JanskyMendise    Assistant: None     Findings: Normal uterus, Normal tubes and ovaries    Specimen: Bilateral fallopian tubes    Foley: 50cc      Fluids: 500cc LR    EBL: 5cc    Anesthesia: General, TAP    The patient was placed in dorsal lithotomy position, prepped and draped in the normal sterile fashion. A speculum was placed into the vagina and the cervix was dilated to allow placement of a Building control surveyorKroner uterine manipulator.  A foley catheter was then placed into the bladder.  Clear urine was noted.      Attention was then drawn to the patients abdomen.  Towel clamps were placed peri-umbically and the abdomen was tented.  A Veres needle was inserted into he umbilicus, CO2 gas was connected and a pneumoperitoneum was allowed to fill to the appropriate pressure.      A 5 mm incision was made in the umbilicus.  A 5 mm trochar was inserted into this site.  Introduction of the laparoscopic camera revealed no signs of trauma and no evidence of bleeding.  Upon initial inspection of the pelvis the uterus, tubes and ovaries appeared normal .     At this point 5mm accessory ports were placed in the left and right lower quadrants.     The fallopian tubes bilaterally we grasped at the fimbriated ends and the mesosalpinx was fulgurated and cut w/ an Enseal divide.  Both tubes wee then amputated at the cornua of the uterus.  Each fallopian tube was then delivered through the lateral assistant ports.     At this point the procedure was deemed complete.  Pneumoperitoneum was allowed to release.  Trochars were removed.  The incisions were re-approximated using Dermabond.  All instrumentation was removed from the vagina.      Needles, spnges, and instruments were counted times two and noted to be correct.  The patient was awakened from anesthesia and brought to the recovery room in stable condition.

## 2017-03-16 NOTE — Progress Notes (Signed)
Pt from OR to PACU bed 13. Pt placed on monitor, id band verified, report received

## 2017-03-16 NOTE — Telephone Encounter (Signed)
Called pt to confirm that she will be coming in for her injection zoladex appt and follow up with Dr.Gudena. Pt had relocated to [facility] and will be seeing a different oncologist from now on. Pt request all appointments to be cancelled at [facility]. Confirmed cancelled appt with pt. Pt appreciates all the care and to let Dr.Gudena know that he is a very wonderful doctor and grateful for such good care.

## 2017-03-17 LAB — SURGICAL PATHOLOGY

## 2017-03-17 MED FILL — XYLOCAINE-MPF 2 % IJ SOLN: 2 % | INTRAMUSCULAR | Qty: 5

## 2017-03-17 MED FILL — DEXAMETHASONE SODIUM PHOSPHATE 20 MG/5ML IJ SOLN: 20 MG/5ML | INTRAMUSCULAR | Qty: 1

## 2017-03-17 MED FILL — ONDANSETRON HCL 4 MG/2ML IJ SOLN: 4 MG/2ML | INTRAMUSCULAR | Qty: 2

## 2017-03-17 MED FILL — ROCURONIUM BROMIDE 50 MG/5ML IV SOLN: 50 MG/5ML | INTRAVENOUS | Qty: 5

## 2017-03-17 MED FILL — ESMOLOL HCL 100 MG/10ML IV SOLN: 100 MG/10ML | INTRAVENOUS | Qty: 10

## 2017-03-17 MED FILL — BRIDION 200 MG/2ML IV SOLN: 200 MG/2ML | INTRAVENOUS | Qty: 2

## 2017-03-24 ENCOUNTER — Ambulatory Visit: Payer: BC Managed Care – PPO

## 2017-03-30 NOTE — Telephone Encounter (Signed)
S: Pt. Calling with symptoms of a yeast infection, requesting an rx for Diflucan.  B: Pt. States symptoms present X 4 days. She is also s/p salpingectomy from 3/22.  A: Pt. Reports thick white vaginal discharge without odor. She does have a post-op appt. Scheduled for 4/9.  R: Will send request to TM to review. Pt. States typically she needs 2 doses of diflucan for infection to clear up.    Reason for Disposition  ??? Symptoms of a vaginal yeast infection (i.e., white, thick, cottage-cheese-like, itchy, not bad smelling discharge)    Protocols used: VAGINAL DISCHARGE-ADULT-[place]

## 2017-04-03 ENCOUNTER — Ambulatory Visit
Admit: 2017-04-03 | Discharge: 2017-04-03 | Payer: PRIVATE HEALTH INSURANCE | Attending: Obstetrics & Gynecology | Primary: Internal Medicine

## 2017-04-03 DIAGNOSIS — Z09 Encounter for follow-up examination after completed treatment for conditions other than malignant neoplasm: Secondary | ICD-10-CM

## 2017-04-03 MED ORDER — FLUCONAZOLE 150 MG PO TABS
150 MG | ORAL_TABLET | ORAL | 0 refills | Status: DC
Start: 2017-04-03 — End: 2017-07-07

## 2017-04-03 NOTE — Progress Notes (Signed)
Subjective:   ????  Alyssa Freeman  36 y.o.  ??  HPI     Pt is s/p Lx Bilateral Salpingectomy.  Pathology is benign.??States has numbness to the R of the incision.      BP 120/72    Pulse 85    Wt 206 lb (93.4 kg)    LMP 03/15/2017 (Exact Date)    BMI 37.68 kg/m??   ??  Review of Systems   Constitutional: Negative for fever.   Gastrointestinal: Negative for constipation, diarrhea, nausea and vomiting.   Genitourinary: Negative for difficulty urinating, flank pain and vaginal bleeding.   ??  ??  Objective:   Physical Exam   Constitutional: She is oriented to person, place, and time. She appears well-developed and well-nourished.   Abdominal: Soft. There is no tenderness. Suture removed x 1 from the umbilicus.   Neurological: She is alert and oriented to person, place, and time.   Skin: Skin is warm and dry.   Psychiatric: She has a normal mood and affect. Her behavior is normal.   ??  ??  Assessment:      1. Follow-up surgery care           Plan:      1. Pt may resume normal activity with no restrictions.   ??

## 2017-04-15 IMAGING — MR MR BREAST BILAT WO/W CM
9 of 13 series · 32 of 48 positions shown · IV contrast (15 ML Multihance)
Comparison: Previous exam(s).

CLINICAL DATA: Patient with recent diagnosis right breast invasive
ductal carcinoma.

EXAM:
BILATERAL BREAST MRI WITH AND WITHOUT CONTRAST
TECHNIQUE: Multiplanar, multisequence MR images of both breasts were obtained
prior to and following the intravenous administration of 15 ml of
MultiHance.

[Series 2: T2 · axial · 3.0mm · 0.94mm/px · z∈[-93,+69]mm · 3 of 55 slices shown]
[im 1/55]
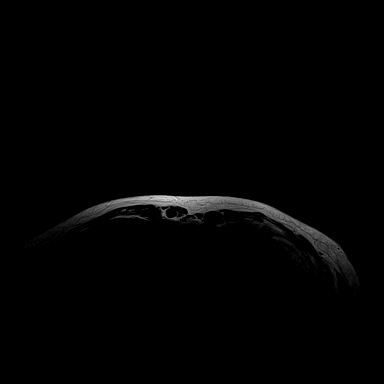
[im 28/55]
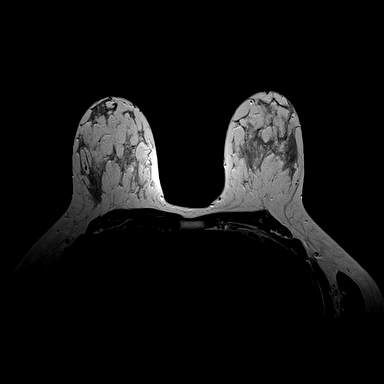
[im 55/55]
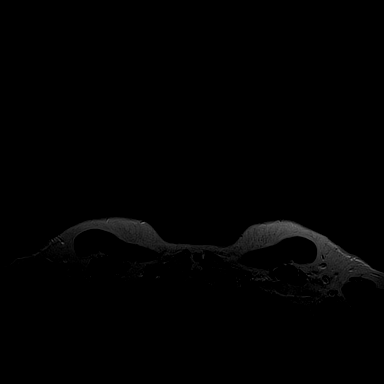

[Series 3: t2_tirm_tra ipat (a-p) · axial · 3.0mm · 0.70mm/px · z∈[-93,+69]mm · 2 of 55 slices shown]
[im 1/55]
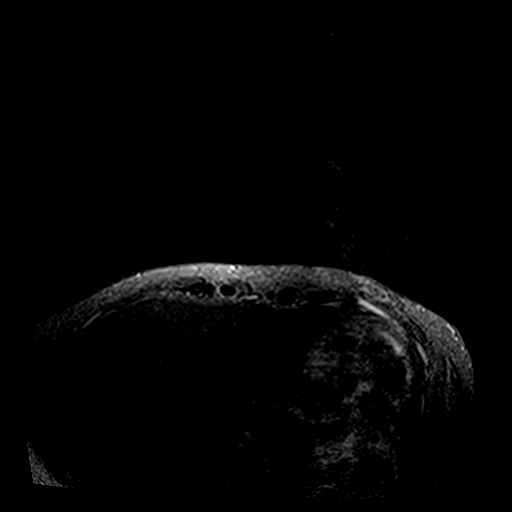
[im 55/55]
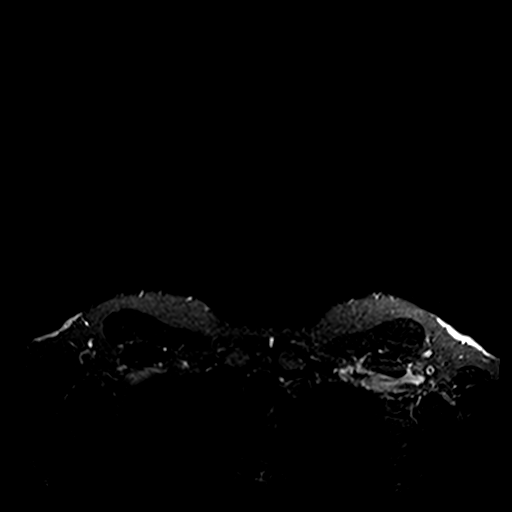

[Series 4: fl3d pre-cm no · axial · non-contrast · 1.2mm · 0.94mm/px · z∈[-98,+74]mm · 5 of 144 slices shown]
[im 1/144]
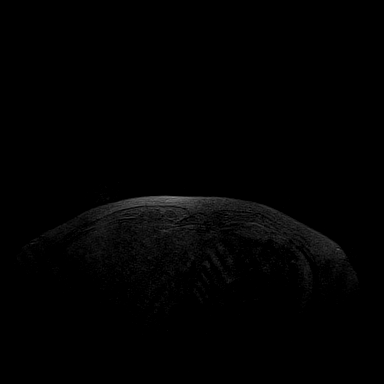
[im 36/144]
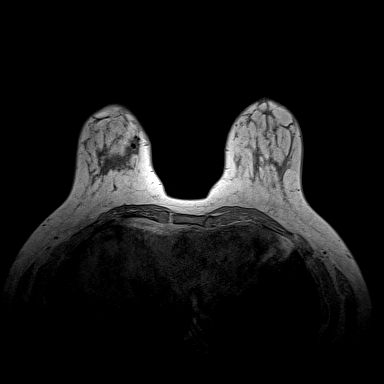
[im 72/144]
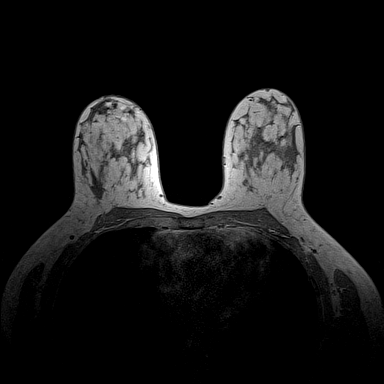
[im 108/144]
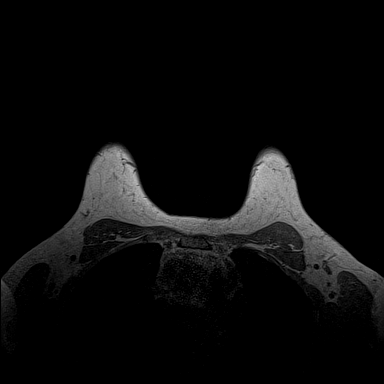
[im 144/144]
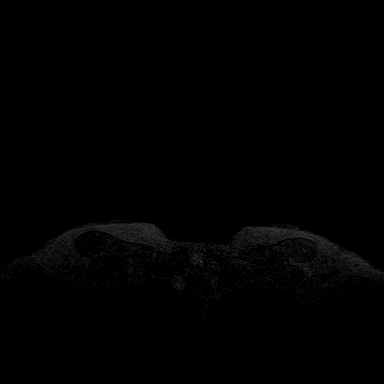

[Series 5: fl3d pre-cm · axial · non-contrast · 1.2mm · 0.94mm/px · z∈[-98,+74]mm · 5 of 144 slices shown]
[im 1/144]
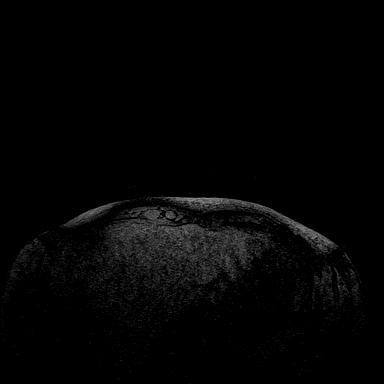
[im 36/144]
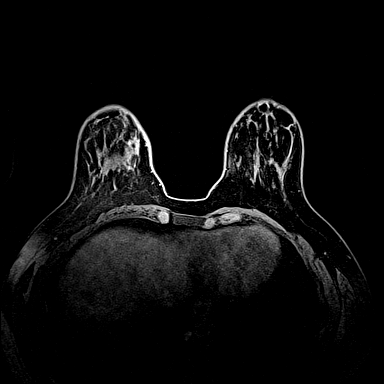
[im 72/144]
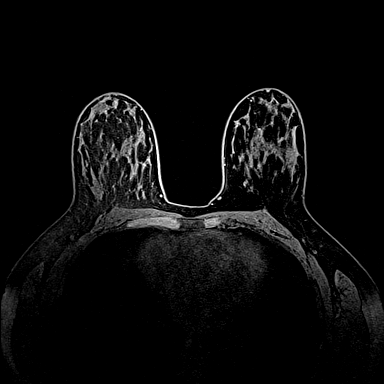
[im 108/144]
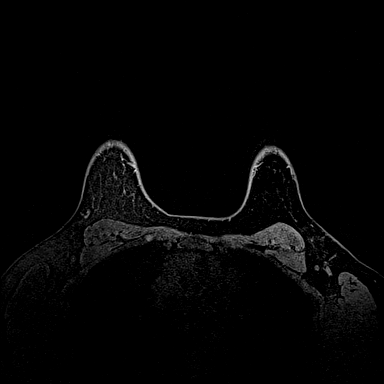
[im 144/144]
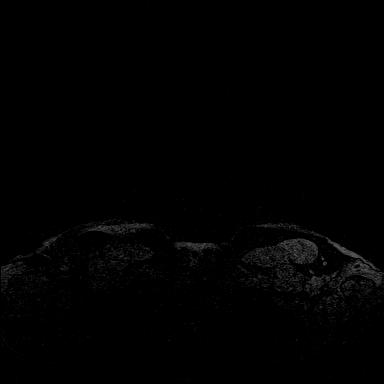

[Series 6: fl3d post-cm 20 · axial · 1.2mm · 0.94mm/px · z∈[-98,+74]mm · 5 of 144 slices shown (1 of 3)]
[im 1/144]
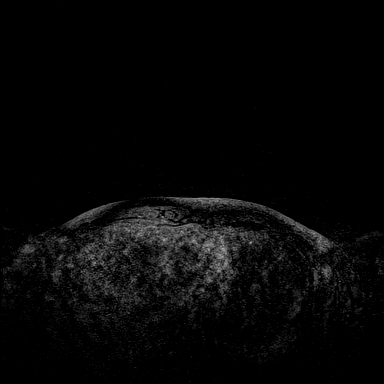
[im 36/144]
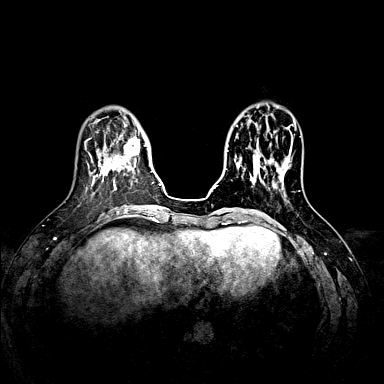
[im 72/144]
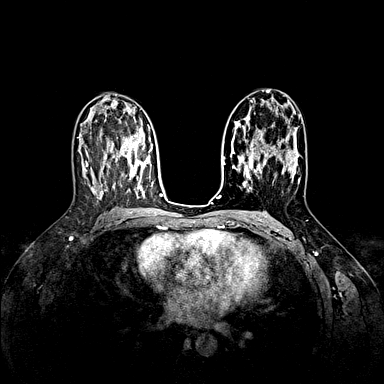
[im 108/144]
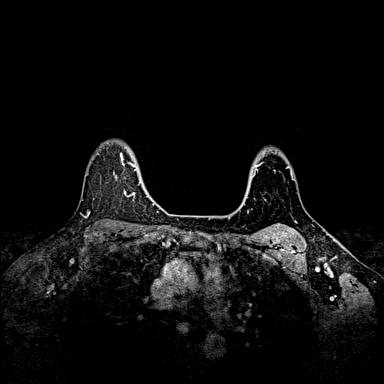
[im 144/144]
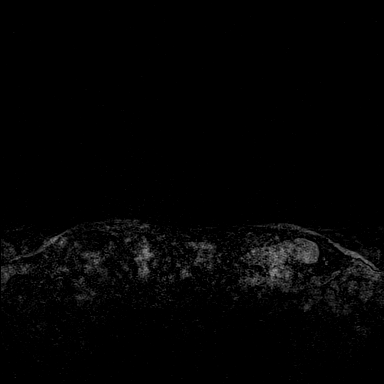

[Series 7: fl3d post-cm 20 · axial · 1.2mm · 0.94mm/px · z∈[-98,+74]mm · 5 of 144 slices shown (2 of 3)]
[im 1/144]
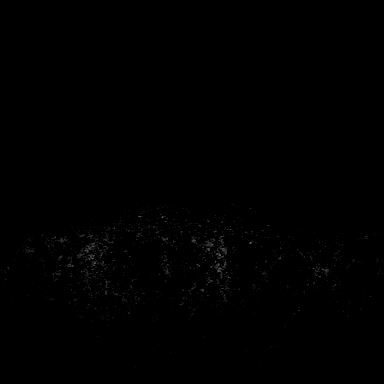
[im 36/144]
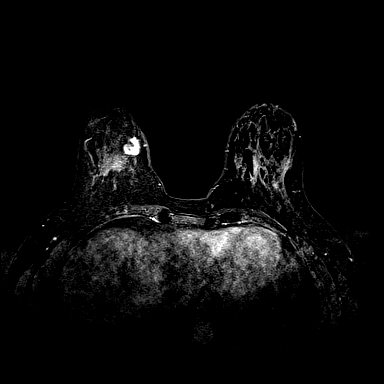
[im 72/144]
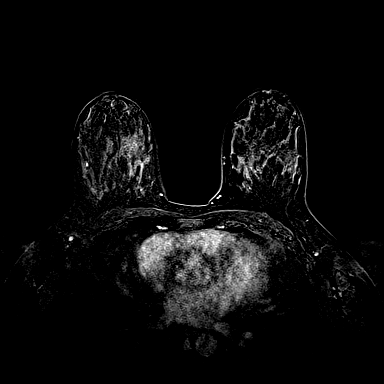
[im 108/144]
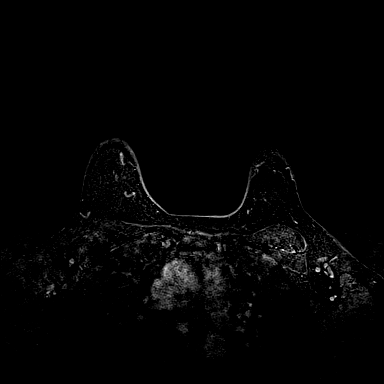
[im 144/144]
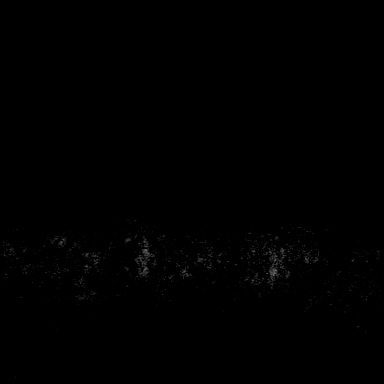

[Series 8: fl3d post-cm 20 · axial · 172.8mm · 0.94mm/px · 1 of 1 slices shown (3 of 3)]
[im 1/1]
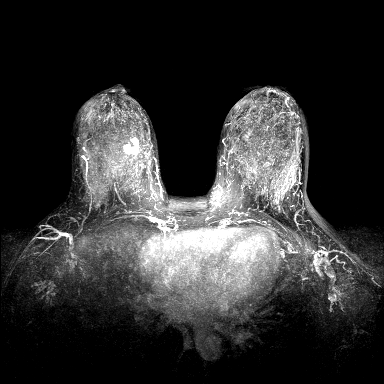

[Series 9: fl3d post-cm 3min · axial · 1.2mm · 0.94mm/px · z∈[-98,+74]mm · 5 of 144 slices shown]
[im 1/144]
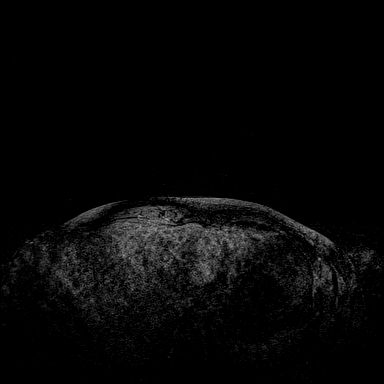
[im 36/144]
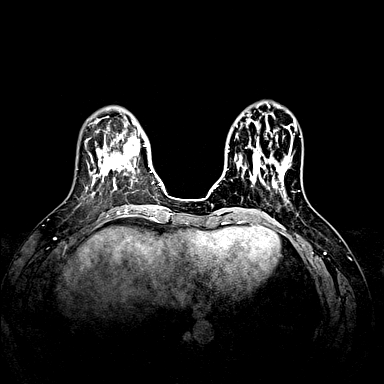
[im 72/144]
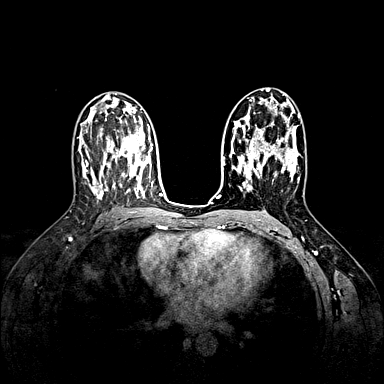
[im 108/144]
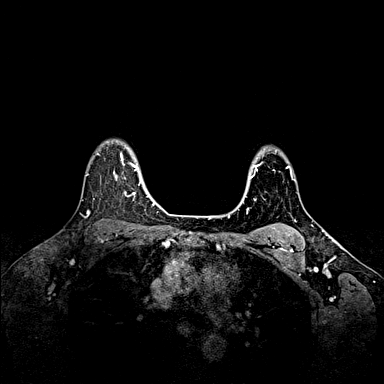
[im 144/144]
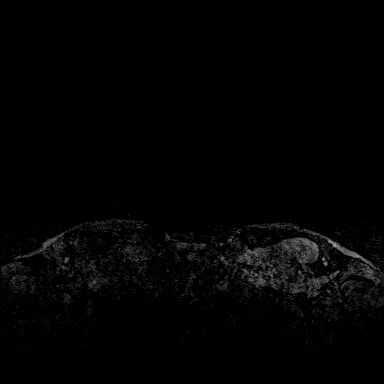

[Series 10: fl3d post-cm 3min_sub · axial · 1.2mm · 0.94mm/px · 1 of 144 slices shown]
[im 1/144]
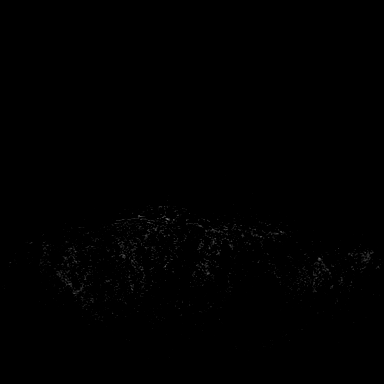

[32 of 48 positions shown; findings below may reference images not displayed]

THREE-DIMENSIONAL MR IMAGE RENDERING ON INDEPENDENT WORKSTATION:

Three-dimensional MR images were rendered by post-processing of the
original MR data on an independent workstation. The
three-dimensional MR images were interpreted, and findings are
reported in the following complete MRI report for this study. Three
dimensional images were evaluated at the independent [employer]
workstation
FINDINGS: Breast composition: c.  Heterogeneous fibroglandular tissue.

Background parenchymal enhancement: Moderate

Right breast: Within the lower inner right breast middle depth there
is a 1.7 x 1.7 x 0.9 cm enhancing mass containing biopsy marking
clip, compatible with recently biopsy proven right breast carcinoma.
No additional sites of suspicious enhancement identified within the
right breast.

Left breast: No mass or abnormal enhancement.

Lymph nodes: No abnormal appearing lymph nodes.

Ancillary findings:  None.
IMPRESSION: Right breast mass compatible with biopsy-proven invasive ductal
carcinoma. No additional sites of suspicious enhancement identified
within either breast.

RECOMMENDATION:
Treatment plan

BI-RADS CATEGORY  6: Known biopsy-proven malignancy.

## 2017-04-21 ENCOUNTER — Ambulatory Visit: Payer: BC Managed Care – PPO | Admitting: Hematology and Oncology

## 2017-04-21 ENCOUNTER — Ambulatory Visit: Payer: BC Managed Care – PPO

## 2017-06-01 ENCOUNTER — Encounter

## 2017-06-01 MED ORDER — TRAZODONE HCL 100 MG PO TABS
100 MG | ORAL_TABLET | ORAL | 2 refills | Status: DC
Start: 2017-06-01 — End: 2017-06-21

## 2017-06-01 NOTE — Telephone Encounter (Signed)
Refill came through [employer]

## 2017-06-15 MED ORDER — OMEPRAZOLE 20 MG PO CPDR
20 MG | ORAL_CAPSULE | Freq: Every day | ORAL | 5 refills | Status: DC
Start: 2017-06-15 — End: 2017-06-21

## 2017-06-15 MED ORDER — SERTRALINE HCL 100 MG PO TABS
100 MG | ORAL_TABLET | ORAL | 11 refills | Status: DC
Start: 2017-06-15 — End: 2017-08-23

## 2017-06-21 ENCOUNTER — Encounter

## 2017-06-21 MED ORDER — TRAZODONE HCL 100 MG PO TABS
100 | ORAL_TABLET | ORAL | 2 refills | Status: DC
Start: 2017-06-21 — End: 2017-12-16

## 2017-06-21 MED ORDER — OMEPRAZOLE 20 MG PO CPDR
20 | ORAL_CAPSULE | Freq: Every day | ORAL | 5 refills | Status: DC
Start: 2017-06-21 — End: 2017-12-16

## 2017-06-21 NOTE — Telephone Encounter (Signed)
Pt calls for refills

## 2017-07-04 NOTE — Telephone Encounter (Addendum)
S:Pt is calling [facility] with complaint of skin lesion near vaginal opening  B:pt has noted this for a few days  A:pt denies noting any drainage from the site. She has some discomfort in the area. She states that she is working inside where it is warm and she is perspiring. The site does not get exposed to air very often. Pt has not had fever.   R:Pt has WWE on Friday and the area can be evaluated. Pt is to take OTC analgesics and to try sitz baths until that time.   Reason for Disposition  . Tender lump (swelling or "ball") at vaginal opening    Protocols used: [facility]AL [facility]ADULT-[facility]

## 2017-07-07 ENCOUNTER — Ambulatory Visit
Admit: 2017-07-07 | Discharge: 2017-07-07 | Payer: PRIVATE HEALTH INSURANCE | Attending: Obstetrics & Gynecology | Primary: Internal Medicine

## 2017-07-07 DIAGNOSIS — L0232 Furuncle of buttock: Secondary | ICD-10-CM

## 2017-07-07 MED ORDER — DOXYCYCLINE HYCLATE 100 MG PO CAPS
100 MG | ORAL_CAPSULE | Freq: Two times a day (BID) | ORAL | 0 refills | Status: DC
Start: 2017-07-07 — End: 2017-09-22

## 2017-07-07 MED ORDER — FLUCONAZOLE 150 MG PO TABS
150 MG | ORAL_TABLET | Freq: Once | ORAL | 0 refills | Status: DC
Start: 2017-07-07 — End: 2017-09-22

## 2017-07-07 NOTE — Addendum Note (Signed)
Addended by: Early OsmondHORINGER, Anntionette Madkins on: 07/07/2017 12:06 PM     Modules accepted: Orders

## 2017-07-07 NOTE — Telephone Encounter (Signed)
Patient left message on refill line asking for a prescription for 2 tablets of Diflucan.    She was in office today and started on doxycycline for an abscess, states she always gets yeast infections after antibiotics and usually needs 2 doses for things to clear.

## 2017-07-07 NOTE — Telephone Encounter (Signed)
rx sent

## 2017-07-07 NOTE — Progress Notes (Signed)
Alyssa PartridgeRachel Freeman  07/07/2017              36 y.o.    Chief Complaint   Patient presents with   . Mass       HPI:  HPI    36 y.o. [phone] [id number] here with a vaginal boil. CUrrent one is near the vagina. Started about 4 days ago. Has had them before. Occur about every couple of weeks. Couple can be in the same areas but then can get them in different areas also. Worse in summer. Not draining but tender. Using a surgical scrub OTC. + tobacco use.       PMH:    has a past medical history of Arthritis; Atrial flutter (HCC); Depression; GERD (gastroesophageal reflux disease); Liver disease; Rash; and Sleep disorder.    REVIEW OF SYSTEMS:  Review of Systems   Constitutional: Negative for fever.   Genitourinary: Positive for genital sores.       PHYSICAL EXAM   BP 109/71   Pulse 75   Wt 198 lb (89.8 kg)   BMI 36.21 kg/m   Body mass index is 36.21 kg/m.    Physical Exam   Constitutional: She is oriented to person, place, and time. She appears well-developed and well-nourished. She is active.   Pulmonary/Chest: Effort normal.   Genitourinary:       There is no rash, tenderness, lesion or injury on the right labia. There is no rash, tenderness, lesion or injury on the left labia.   Neurological: She is alert and oriented to person, place, and time.   Skin: Skin is dry and intact.   Psychiatric: She has a normal mood and affect. Her speech is normal and behavior is normal.     ASSESSMENT/PLAN  [name] was seen today for mass.    Diagnoses and all orders for this visit:    Boil of buttock  -     doxycycline hyclate (VIBRAMYCIN) 100 MG capsule; Take 1 capsule by mouth 2 times daily for 7 days  -     Aerobic culture; Future      Return if symptoms worsen or fail to improve.

## 2017-07-11 LAB — CULTURE, AEROBIC

## 2017-07-17 NOTE — Telephone Encounter (Signed)
Call to patient, no answer, left message for a return call for message details.

## 2017-07-17 NOTE — Telephone Encounter (Signed)
-----   Message from Imelda PillowSevasti K Yeropoli, MD sent at 07/14/2017  5:34 PM EDT -----  Culture grew out GBS. How is the boil, has it resolved?

## 2017-07-17 NOTE — Telephone Encounter (Signed)
NOT completely gone, but is smaller.  3/4 gone at this point.   Lesion was external , but close to Vaginal opening.   Treated and done with Doxycycline, no problems except Yeast infection, treating with Diflucan PO

## 2017-07-19 NOTE — Telephone Encounter (Signed)
Ok, let me know of any issues. Thanks

## 2017-08-02 IMAGING — US US SOFT TISSUE HEAD/NECK
1 series · 14 of 25 positions shown · non-contrast
Comparison: 04/11/2014

CLINICAL DATA: Follow-up nodule

EXAM:
THYROID ULTRASOUND
TECHNIQUE: Ultrasound examination of the thyroid gland and adjacent soft
tissues was performed.

[Series 1: us soft tissue head/neck · 0.09mm/px · 14 of 44 slices shown]
[im 1/44]
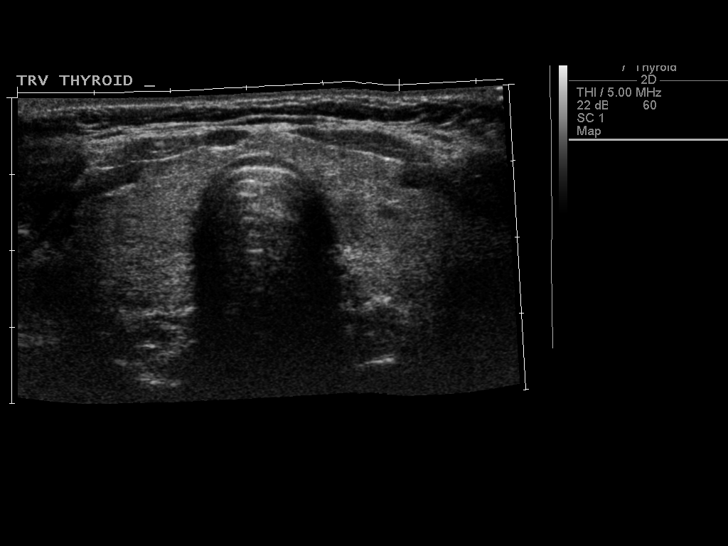
[im 4/44]
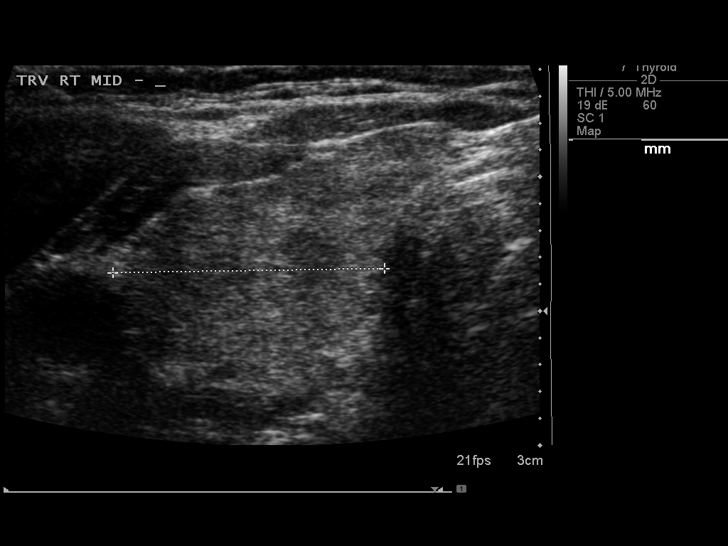
[im 8/44]
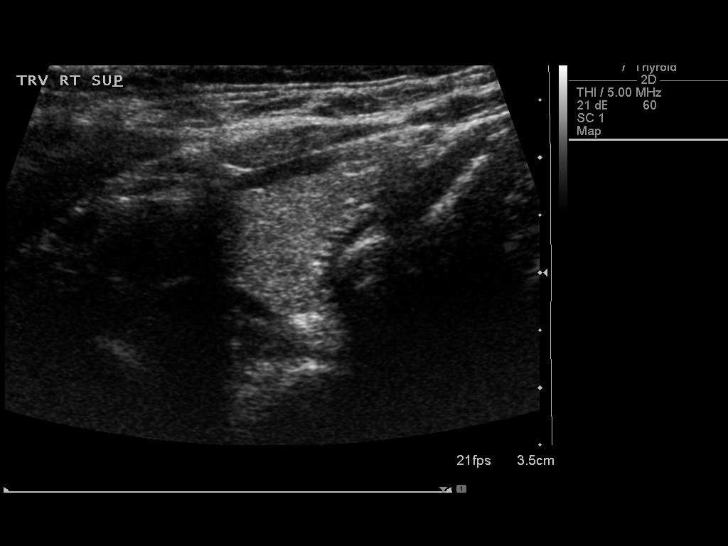
[im 11/44]
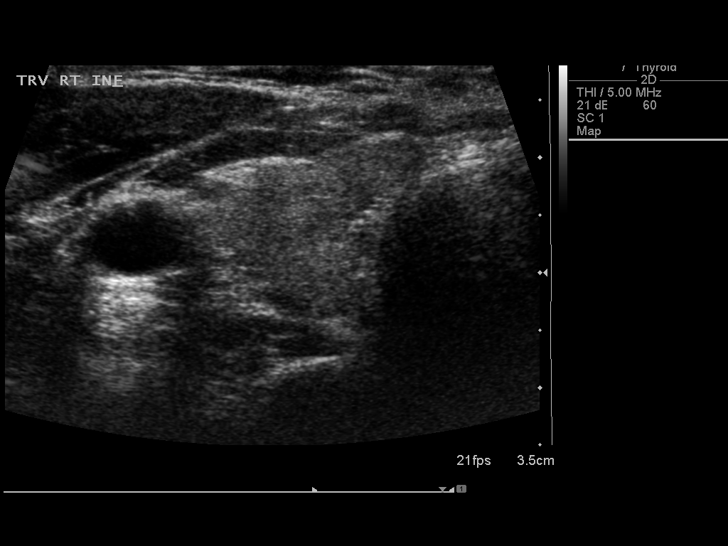
[im 15/44]
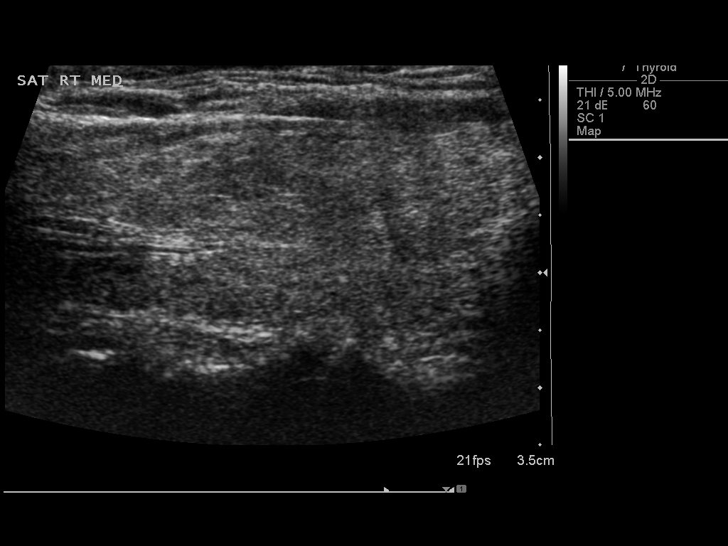
[im 17/44]
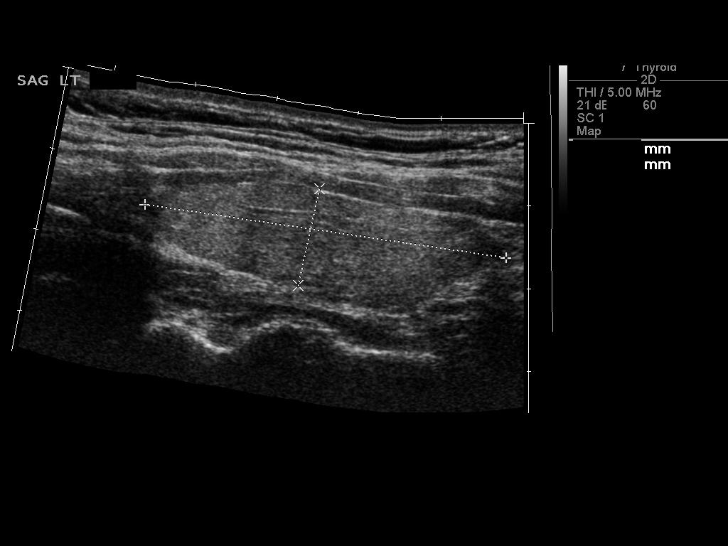
[im 20/44]
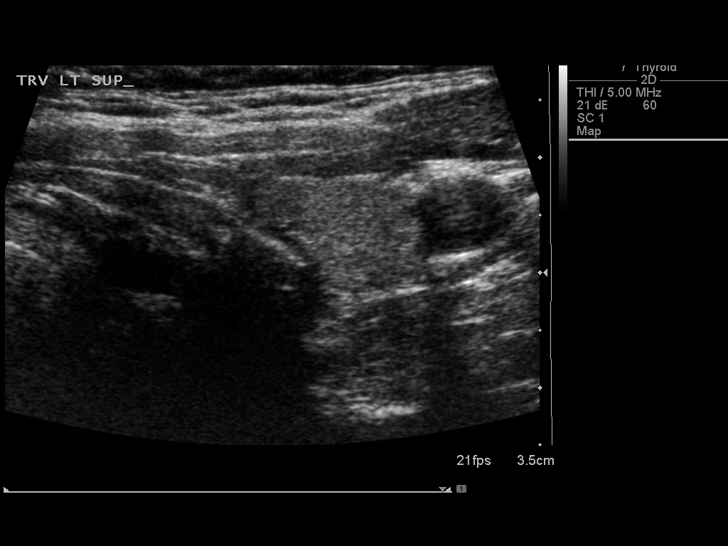
[im 24/44]
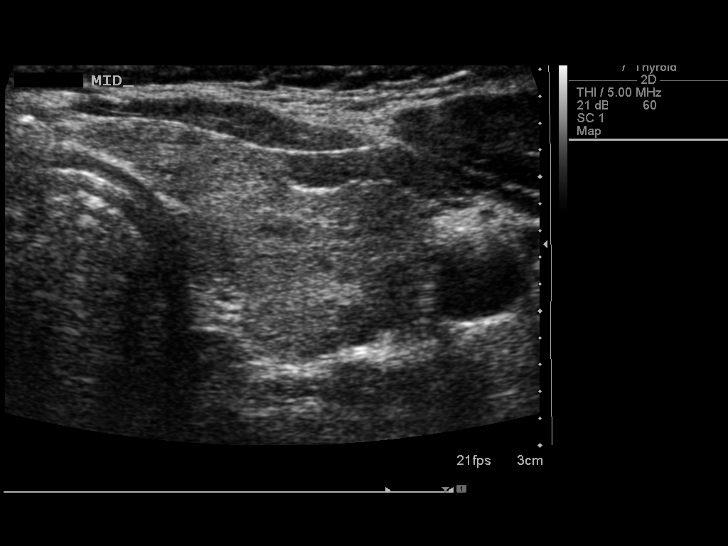
[im 27/44]
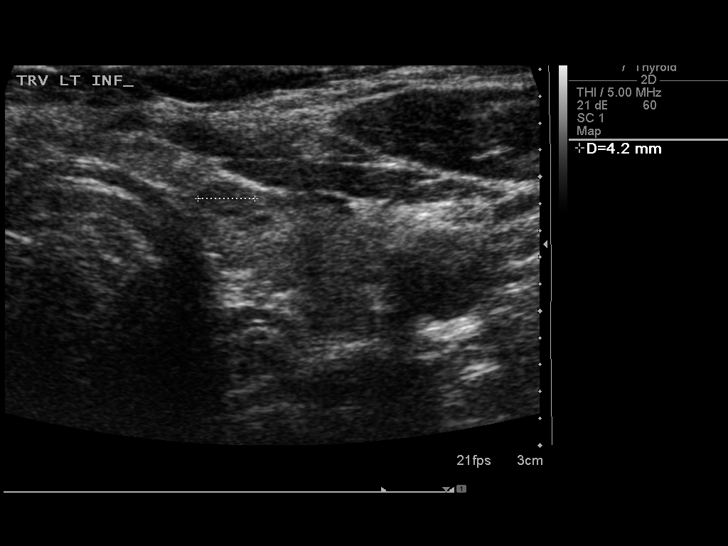
[im 29/44]
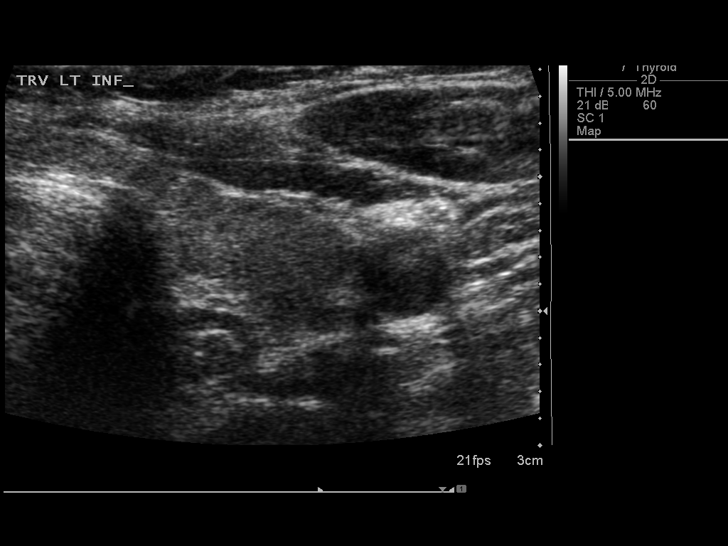
[im 33/44]
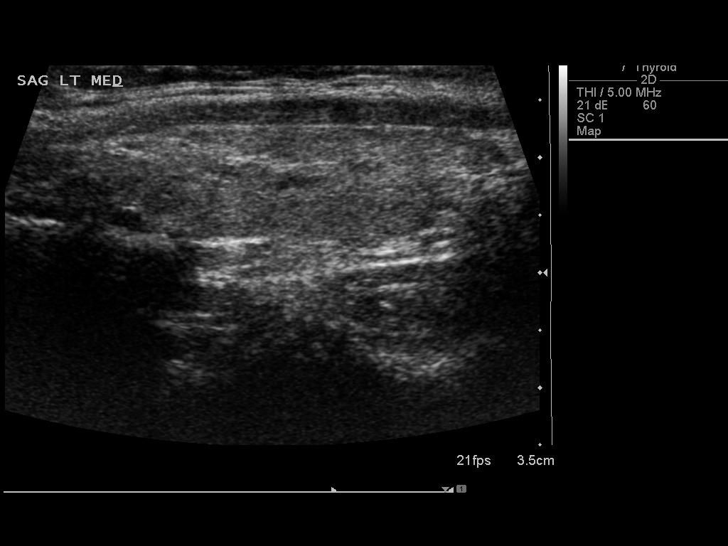
[im 36/44]
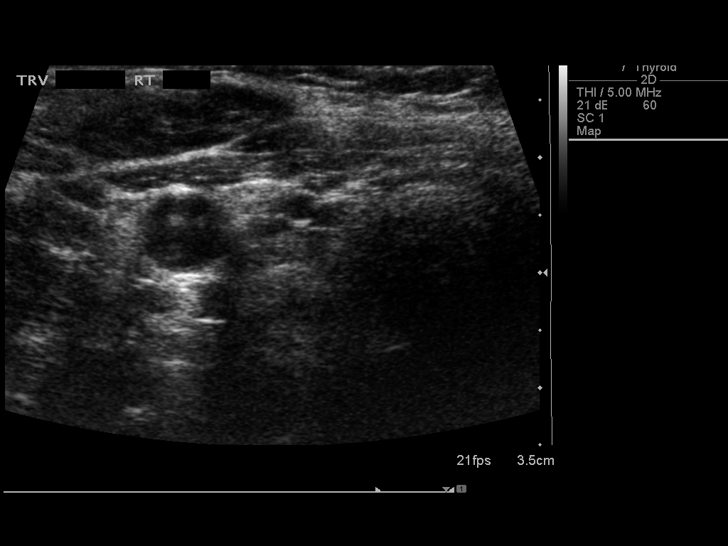
[im 40/44]
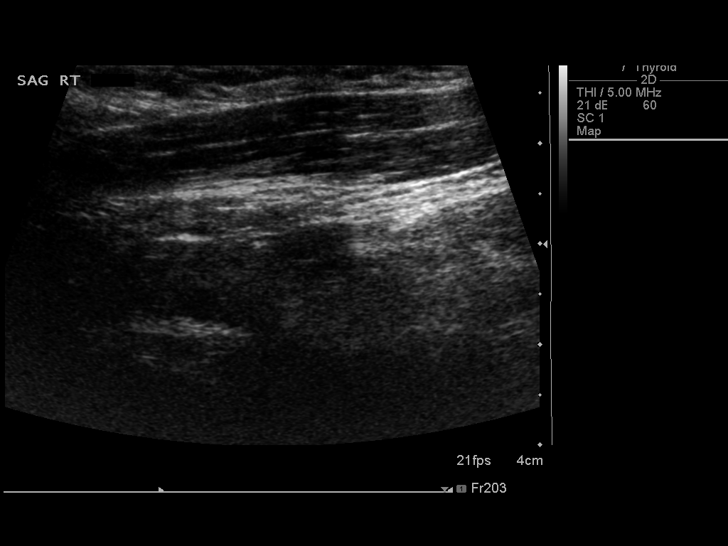
[im 44/44]
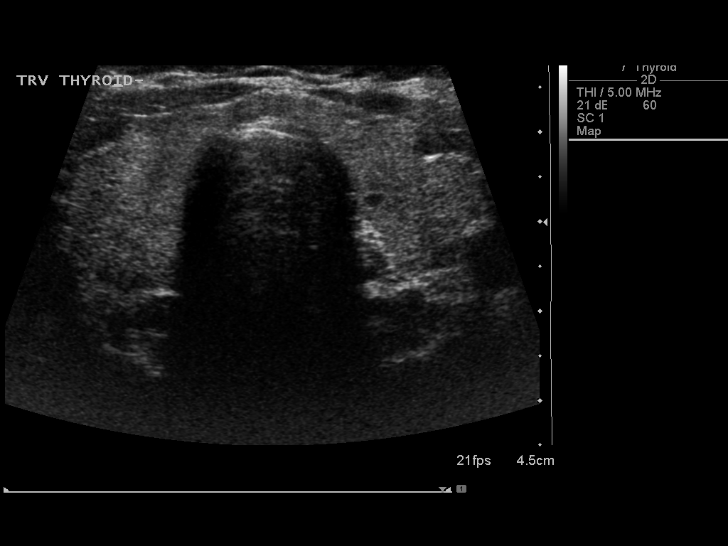

[14 of 25 positions shown; findings below may reference images not displayed]

FINDINGS: Right thyroid lobe

Measurements: 5.1 x 1.5 x 2.0 cm.  No nodules visualized.

Left thyroid lobe

Measurements: 4.4 x 1.2 x 2.1 cm. Ill-defined upper pole nodule
measures 7 mm. Lower pole nodule measures 4 mm.

Isthmus

Thickness: 3 mm.  Left isthmic nodule measures 3 mm.

Lymphadenopathy

None visualized.
IMPRESSION: Small left-sided nodules as described. The largest measures 7 mm.
Findings do not meet current SRU consensus criteria for biopsy.
Follow-up by clinical exam is recommended. If patient has known risk
factors for thyroid carcinoma, consider follow-up ultrasound in 12
months. If patient is clinically hyperthyroid, consider nuclear
medicine thyroid uptake and scan.Reference: Management of Thyroid
Nodules Detected at US: Society of Radiologists in Ultrasound

## 2017-08-23 ENCOUNTER — Ambulatory Visit
Admit: 2017-08-23 | Discharge: 2017-08-23 | Payer: PRIVATE HEALTH INSURANCE | Attending: Internal Medicine | Primary: Internal Medicine

## 2017-08-23 DIAGNOSIS — K219 Gastro-esophageal reflux disease without esophagitis: Secondary | ICD-10-CM

## 2017-08-23 MED ORDER — VENLAFAXINE HCL ER 150 MG PO CP24
150 MG | ORAL_CAPSULE | Freq: Every day | ORAL | 3 refills | Status: DC
Start: 2017-08-23 — End: 2017-12-16

## 2017-08-23 NOTE — Progress Notes (Signed)
Subjective:  Alyssa Freeman is a 36 y.o. female who presents with chief complaint of Other (Zoloft not working)      HPI:  GERD:  Patient is stable she is taking her prilosec       Depression with anxiety :  Patient presents for follow up of depression/anxiety :   Condition  Stable:  [x]  Yes    []  No   Patient is compliant with medications: [x]  Yes  []  No   Meds:   SSRI  [x]     SNRI  []    TCA  []   Seroquel  []    Severe Side effects: [x]  Yes  []  No   Suicide ideations:  []  Yes  [x]  No   Homicide ideations []  Yes  [x]  No     PHQ-9 score:      7        Last score:     Anxiety:   []  Better   []  Worse    []  Same     [x]  None         Lab Results   Component Value Date    LDLCHOLESTEROL 181 (A) 07/02/2015    Lab Results   Component Value Date    ALKPHOS 73 03/09/2017    ALT 15 03/09/2017    AST 27 03/09/2017    BILITOT 0.3 03/09/2017       Sodium (mmol/L)   Date Value   03/09/2017 138    BUN (mg/dL)   Date Value   16/09/9603 9    Glucose (mg/dL)   Date Value   54/08/8118 112 (H)      Potassium (mmol/L)   Date Value   03/09/2017 3.9    CREATININE (mg/dL)   Date Value   14/78/2956 0.66    No components found for: HBA1C     Current Meds: reviewed below  Current Outpatient Prescriptions   Medication Sig Dispense Refill   . venlafaxine (EFFEXOR XR) 150 MG extended release capsule Take 1 capsule by mouth daily 30 capsule 3   . traZODone (DESYREL) 100 MG tablet TAKE 1/2 TABLET BY MOUTH EVERY DAY AT BEDTIME 30 tablet 2   . omeprazole (PRILOSEC) 20 MG delayed release capsule Take 1 capsule by mouth Daily 30 capsule 5     No current facility-administered medications for this visit.          Past Medical History:   Diagnosis Date   . Arthritis    . Atrial flutter (HCC)    . Depression    . GERD (gastroesophageal reflux disease)    . Liver disease     HCV GT 1a/1b   . Rash    . Sleep disorder      family history includes Depression in her mother; High Blood Pressure in her father and mother; No Known Problems in her brother and  sister.   reports that she has been smoking Cigarettes.  She has a 10.00 pack-year smoking history. She has never used smokeless tobacco.    Review of Systems   Constitutional: Negative for chills and fever.   Respiratory: Negative for cough and shortness of breath.    Cardiovascular: Negative for chest pain and palpitations.   Gastrointestinal: Negative for diarrhea, nausea and vomiting.       Objective:  Wt Readings from Last 3 Encounters:   08/23/17 196 lb (88.9 kg)   07/07/17 198 lb (89.8 kg)   04/03/17 206 lb (93.4 kg)       Vitals:  08/23/17 1533   BP: 120/80   Weight: 196 lb (88.9 kg)   Height: 5' 2.25" (1.581 m)       Physical Exam   Constitutional: She is oriented to person, place, and time. She appears well-developed and well-nourished.   HENT:   Head: Normocephalic and atraumatic.   Mouth/Throat: No oropharyngeal exudate.   Eyes: Pupils are equal, round, and reactive to light. Conjunctivae are normal.   Neck: Normal range of motion. Neck supple.   Cardiovascular: Normal rate, regular rhythm and normal heart sounds.  Exam reveals no gallop and no friction rub.    No murmur heard.  Pulmonary/Chest: Effort normal. No respiratory distress. She has no wheezes. She has no rales.   Abdominal: Soft. Bowel sounds are normal.   Neurological: She is alert and oriented to person, place, and time.   Skin: Skin is warm and dry.         ASSESSMENT/PLAN:    1. Gastroesophageal reflux disease without esophagitis    2. Dysthymia  - venlafaxine (EFFEXOR XR) 150 MG extended release capsule; Take 1 capsule by mouth daily  Dispense: 30 capsule; Refill: 3    3. Positive depression screening  - Positive Screen for Clinical Depression with a Documented Follow-up Plan [phone]      If any new medication listed above was given by me I did discuss this medication with the patient and provided education and went over the most common side effects and cautions of the medication and patient demonstrates understanding    Goals     None           Medications Discontinued During This Encounter   Medication Reason   . Acetaminophen (TYLENOL ARTHRITIS PAIN PO) ERROR   . sertraline (ZOLOFT) 100 MG tablet          FOLLOWUP      Return in about 3 months (around 11/23/2017).    Electronically signed by Tommy MedalMatthew Ambry Dix, MD on 08/23/17 at 3:39 PM        On the basis of positive PHQ-9 screening (PHQ-9 Total Score: 7), the following plan was implemented: patient declines further evaluation/treatment for depression.  Patient will follow-up in 3 month(s) with PCP.

## 2017-09-22 ENCOUNTER — Encounter

## 2017-09-22 ENCOUNTER — Ambulatory Visit
Admit: 2017-09-22 | Discharge: 2017-09-22 | Payer: PRIVATE HEALTH INSURANCE | Attending: Obstetrics & Gynecology | Primary: Internal Medicine

## 2017-09-22 DIAGNOSIS — L0232 Furuncle of buttock: Secondary | ICD-10-CM

## 2017-09-22 MED ORDER — DOXYCYCLINE HYCLATE 100 MG PO CAPS
100 | ORAL_CAPSULE | Freq: Two times a day (BID) | ORAL | 0 refills | Status: AC
Start: 2017-09-22 — End: 2017-10-02

## 2017-09-22 MED ORDER — FLUCONAZOLE 150 MG PO TABS
150 | ORAL_TABLET | Freq: Once | ORAL | 0 refills | Status: AC
Start: 2017-09-22 — End: 2017-09-22

## 2017-09-22 NOTE — Progress Notes (Signed)
Alyssa PartridgeRachel Freeman  09/22/2017              36 y.o.    Chief Complaint   Patient presents with   . Sexually Transmitted Diseases       HPI:  HPI    Chief Complaint   Patient presents with   . Sexually Transmitted Diseases       SUBJECTIVE:  Alyssa PartridgeRachel Freeman is a 36 y.o. presenting for STD testing- found out partner was having other partners. No known STD exposure       Sexually active: yes  New partner: No  Condoms: no  Vaginal Discharge: No  Vagina./vulvar Itching: No  Vaginal Odor: No  Lesions: yes- has a boil on the left buttock that keeps recurring. Flared now and very painful. Used doxy before and decreased in size- mixed skin flora.       PMH:    has a past medical history of Arthritis; Atrial flutter (HCC); Depression; GERD (gastroesophageal reflux disease); Liver disease; Rash; and Sleep disorder.    REVIEW OF SYSTEMS:  Review of Systems   Constitutional: Negative for fever.   Genitourinary: Positive for genital sores. Negative for vaginal discharge.       PHYSICAL EXAM   BP 123/79   Pulse 94   Wt 194 lb (88 kg)   LMP 08/21/2017   BMI 35.20 kg/m   Body mass index is 35.2 kg/m.    Physical Exam   Constitutional: She appears well-developed and well-nourished.   Pulmonary/Chest: Effort normal.   Genitourinary: Rectum normal.       There is no rash or lesion on the right labia. There is no rash or lesion on the left labia. Cervix exhibits no discharge and no friability. No vaginal discharge found.   Genitourinary Comments: Urethra: normal  Vestibule: normal   Perineum: normal    Skin: Skin is dry and intact.   Psychiatric: She has a normal mood and affect. Her behavior is normal.       ASSESSMENT/PLAN  Alyssa Freeman was seen today for sexually transmitted diseases.    Diagnoses and all orders for this visit:    Boil of buttock- likely HS, wants it removed   -     [facility] Bender[facility]  -     Aerobic culture; Future  -     doxycycline hyclate (VIBRAMYCIN) 100 MG capsule; Take 1 capsule by mouth 2  times daily for 10 days  -     fluconazole (DIFLUCAN) 150 MG tablet; Take 1 tablet by mouth once for 1 dose May repeat in 3 days if symptoms persist    Screening for STD (sexually transmitted disease)  -     C. Trachomatis / N. Gonorrhoeae, DNA  -     Hepatitis B Surface Antigen; Future  -     Hepatitis C Antibody; Future  -     HIV Screen; Future  -     RPR with FTA Relex; Future

## 2017-09-23 LAB — C. TRACHOMATIS / N. GONORRHOEAE, DNA
C. trachomatis DNA: NOT DETECTED
NEISSERIA GONORRHOEAE, DNA: NOT DETECTED

## 2017-09-23 LAB — RPR WITH FTA REFLEX: RPR: NONREACTIVE NA

## 2017-09-23 LAB — HEPATITIS C ANTIBODY: Hepatitis C Ab: DETECTED NA — AB

## 2017-09-23 LAB — HEPATITIS B SURFACE ANTIGEN: Hepatitis B Surface Ag: NOT DETECTED NA

## 2017-09-23 LAB — HIV SCREEN: HIV 1+2 AB+HIV1P24 AG, EIA: NONREACTIVE NA

## 2017-09-26 LAB — CULTURE, AEROBIC

## 2017-09-29 ENCOUNTER — Encounter: Payer: Self-pay | Admitting: Hematology and Oncology

## 2017-10-03 ENCOUNTER — Ambulatory Visit
Admit: 2017-10-03 | Discharge: 2017-10-03 | Payer: PRIVATE HEALTH INSURANCE | Attending: Surgery | Primary: Internal Medicine

## 2017-10-03 DIAGNOSIS — L732 Hidradenitis suppurativa: Secondary | ICD-10-CM

## 2017-10-03 MED ORDER — CLINDAMYCIN HCL 300 MG PO CAPS
300 MG | ORAL_CAPSULE | Freq: Two times a day (BID) | ORAL | 0 refills | Status: AC
Start: 2017-10-03 — End: 2017-10-13

## 2017-10-03 NOTE — Progress Notes (Signed)
Subjective:     Patient: Alyssa Freeman is a 36 y.o. female     HPI patient is a 36 year old female who for about a year and a half is had episodes of inflammation involving the hair follicles of both armpits left greater than right upper thigh and groin areas left greater than right and perivaginal area.  Within the past 10 days she has had "one of the cyst near her vagina" she has been on antibiotics.  She has had some drainage from the site and "it is gone down quite a bit" she has had no fevers chills nausea or vomiting.  She has never had MRSA infection or C. difficile colitis    Review of Systems   Constitutional: Negative for appetite change and fatigue.   HENT: Negative for voice change.    Respiratory: Negative for cough and chest tightness.    Gastrointestinal: Negative for abdominal distention, abdominal pain, constipation, diarrhea, nausea and vomiting.   Endocrine: Negative for heat intolerance.   Genitourinary: Negative for dysuria, frequency and vaginal bleeding.   Musculoskeletal: Negative for arthralgias and myalgias.   Neurological: Negative for weakness and headaches.   Hematological: Negative for adenopathy.   Psychiatric/Behavioral: Negative for suicidal ideas. The patient is not nervous/anxious.         No Known Allergies  Current Outpatient Prescriptions on File Prior to Visit   Medication Sig Dispense Refill   . venlafaxine (EFFEXOR XR) 150 MG extended release capsule Take 1 capsule by mouth daily 30 capsule 3   . traZODone (DESYREL) 100 MG tablet TAKE 1/2 TABLET BY MOUTH EVERY DAY AT BEDTIME 30 tablet 2   . omeprazole (PRILOSEC) 20 MG delayed release capsule Take 1 capsule by mouth Daily 30 capsule 5     No current facility-administered medications on file prior to visit.       Past Medical History:   Diagnosis Date   . Arthritis    . Atrial flutter (HCC)    . Depression    . GERD (gastroesophageal reflux disease)    . Liver disease     HCV GT 1a/1b   . Rash    . Sleep disorder       Past  Surgical History:   Procedure Laterality Date   . CHOLECYSTECTOMY     . FEMUR SURGERY Right     with hardware placement   . SALPINGECTOMY Bilateral 03/16/2017     Bilateral Salpingectomy     Family History   Problem Relation Age of Onset   . High Blood Pressure Mother    . Depression Mother    . High Blood Pressure Father    . No Known Problems Sister    . No Known Problems Brother      Social History   Substance Use Topics   . Smoking status: Current Every Day Smoker     Packs/day: 0.50     Years: 20.00     Types: Cigarettes   . Smokeless tobacco: Never Used      Comment: Patient is a 1/2 pack a day smoker and has smoked since the age of 13 years   . Alcohol use No      Comment: 07/12/2014 sober date          Objective:     BP 114/76 (Site: Left Upper Arm, Position: Sitting, Cuff Size: Large Adult)   Pulse 96   Ht  (1.575 m)   Wt 192 lb (87.1 kg)   LMP  08/21/2017   BMI 35.12 kg/m     Physical Exam   Constitutional: Vital signs are normal. She appears well-developed and well-nourished. She is cooperative.   HENT:   Head: Normocephalic.   Eyes: Pupils are equal, round, and reactive to light.   Neck: No tracheal deviation present. No thyromegaly present.   Cardiovascular: Normal rate and normal heart sounds.    No murmur heard.  Pulmonary/Chest: She has no wheezes. She has no rales. She exhibits no tenderness.   Abdominal: She exhibits no distension, no fluid wave and no abdominal bruit. There is no hepatosplenomegaly. There is no guarding, no tenderness at McBurney's point and negative Murphy's sign. No hernia.   Genitourinary:         Genitourinary Comments: Patient has evidence of hidradenitis suppurativa with evidence of inflammation of multiple hair follicles in the left upper thigh area and one in the perivaginal area on the left side there is a small amount of cellulitis around the perivaginal area without fluctuance   Musculoskeletal: She exhibits no edema.   Lymphadenopathy:     She has no cervical  adenopathy.   Neurological: No cranial nerve deficit. Coordination normal.   Skin: No rash noted.   Psychiatric: She has a normal mood and affect.       Assessment      1. Hidradenitis    2. Cellulitis of left lower extremity         Plan      1. Hidradenitis    2. Cellulitis of left lower extremity    Patient was informed that she should not shave-micro-cuts cause infection;nair may be used for hair removal.  She was advised to use Dial soap or Cetaphil soap.  She was given another prescription for an antibiotic for the cellulitis.  Surgical intervention is not necessary at this time.  She was advised to stop smoking as cigarette smokers have a higher incidence of recurrence  Benjaman Lobe, MD  10/03/17  10:49 AM      Health Maintenance Due   Topic Date Due   . DTaP/Tdap/Td vaccine (1 - Tdap) 05/27/2013   . Flu vaccine (1) 08/26/2017

## 2017-10-31 ENCOUNTER — Telehealth: Payer: Self-pay | Admitting: Hematology and Oncology

## 2017-10-31 ENCOUNTER — Encounter: Payer: Self-pay | Admitting: Hematology and Oncology

## 2017-10-31 ENCOUNTER — Encounter: Attending: Surgery | Primary: Internal Medicine

## 2017-10-31 NOTE — Telephone Encounter (Signed)
10/31/2017 @ 6:44 am faxed Cancer, Specified Disease, Intensive Care, and Heart/Stroke claim form successfully faxed to [phone]. Left message with Debrina @ [phone] @ 6:53 am informing her that forms were completed and faxed.

## 2017-11-15 ENCOUNTER — Encounter: Attending: Internal Medicine | Primary: Internal Medicine

## 2017-12-16 ENCOUNTER — Encounter

## 2017-12-16 NOTE — Telephone Encounter (Signed)
S- pt called [facility] with medication refill request  B- pt has 6 trazadone and 3 of omeprazole and 3 effexor left  A- N/A  R- advised pt trazadone was called in from provider today and other medications have been pended. Pt will call pharmacy to follow up with medications.   Reason for Disposition  . Caller requesting a refill, no triage required, and triager able to refill per unit policy    Protocols used: MEDICATION QUESTION CALL-ADULT-AH

## 2017-12-18 MED ORDER — VENLAFAXINE HCL ER 150 MG PO CP24
150 | ORAL_CAPSULE | Freq: Every day | ORAL | 3 refills | Status: DC
Start: 2017-12-18 — End: 2020-07-28

## 2017-12-18 MED ORDER — OMEPRAZOLE 20 MG PO CPDR
20 | ORAL_CAPSULE | Freq: Every day | ORAL | 11 refills | Status: DC
Start: 2017-12-18 — End: 2018-04-10

## 2017-12-18 MED ORDER — VENLAFAXINE HCL ER 150 MG PO CP24
150 | ORAL_CAPSULE | ORAL | 3 refills | Status: DC
Start: 2017-12-18 — End: 2017-12-27

## 2017-12-18 MED ORDER — TRAZODONE HCL 100 MG PO TABS
100 MG | ORAL_TABLET | ORAL | 2 refills | Status: DC
Start: 2017-12-18 — End: 2018-02-28

## 2017-12-18 NOTE — Telephone Encounter (Signed)
08/23/17

## 2017-12-27 ENCOUNTER — Ambulatory Visit
Admit: 2017-12-27 | Discharge: 2017-12-27 | Payer: PRIVATE HEALTH INSURANCE | Attending: Internal Medicine | Primary: Internal Medicine

## 2017-12-27 DIAGNOSIS — J019 Acute sinusitis, unspecified: Secondary | ICD-10-CM

## 2017-12-27 MED ORDER — BENZONATATE 100 MG PO CAPS
100 MG | ORAL_CAPSULE | Freq: Three times a day (TID) | ORAL | 0 refills | Status: DC | PRN
Start: 2017-12-27 — End: 2018-04-10

## 2017-12-27 MED ORDER — IPRATROPIUM BROMIDE 0.03 % NA SOLN
0.03 % | Freq: Three times a day (TID) | NASAL | 0 refills | Status: DC
Start: 2017-12-27 — End: 2018-01-23

## 2017-12-27 MED ORDER — MUCINEX DM 30-600 MG PO TB12
30-600 MG | ORAL_TABLET | Freq: Two times a day (BID) | ORAL | 0 refills | Status: DC | PRN
Start: 2017-12-27 — End: 2018-06-01

## 2017-12-27 NOTE — Progress Notes (Signed)
Patient: Alyssa PartridgeRachel Gentry is a 37 y.o. female     Chief Complaint   Patient presents with   . Pharyngitis     congestion nasal drainage       Assessment And Plan      Diagnosis Orders   1. Acute rhinosinusitis  ipratropium (ATROVENT) 0.03 % nasal spray   2. Acute bronchitis, unspecified organism  Dextromethorphan-Guaifenesin (MUCINEX DM) 30-600 MG TB12    benzonatate (TESSALON PERLES) 100 MG capsule   3. Acute pharyngitis, unspecified etiology         --Acute Rhinosinusitis: appears to be viral, not influenza. Atrovent nasal prn, otc nasal saline prn.  Recommend rest, fluids, call if no better - will consider atbx.      --Acute Bronchitis: improving. mucinex-dm prn, tessalon prn.     --Acute Pharyngitis: resolving.     Patient's Medications   New Prescriptions    BENZONATATE (TESSALON PERLES) 100 MG CAPSULE    Take 1 capsule by mouth 3 times daily as needed for Cough    DEXTROMETHORPHAN-GUAIFENESIN (MUCINEX DM) 30-600 MG TB12    Take 1 tablet by mouth 2 times daily as needed (Cough)    IPRATROPIUM (ATROVENT) 0.03 % NASAL SPRAY    2 sprays by Nasal route 3 times daily PRN Nasal Congestion x 5 Days   Previous Medications    OMEPRAZOLE (PRILOSEC) 20 MG DELAYED RELEASE CAPSULE    Take 1 capsule by mouth Daily    TRAZODONE (DESYREL) 100 MG TABLET    TAKE 1/2 TABLET BY MOUTH DAILY AT BEDTIME    VENLAFAXINE (EFFEXOR XR) 150 MG EXTENDED RELEASE CAPSULE    Take 1 capsule by mouth daily   Modified Medications    No medications on file   Discontinued Medications    VENLAFAXINE (EFFEXOR XR) 150 MG EXTENDED RELEASE CAPSULE    TAKE 1 CAPSULE BY MOUTH EVERY DAY       If anynew medications/maintainence medications listed above were given by me, I did discuss this medication with the patient and provided side effects, interactions, and consequences of taking the medication.  All patient'squestions answered and patient demonstrates understanding.    Goals     None          Subjective:     HPI   --started 3 weeks ago.  + sick contacts -  similar symptoms.  No f/c/rigors.  No body aches.  + flu shot. + nasal congestion compared to baseline, slightly yellow d/c. A little sinus pressure.  + pnd.  Throat was sore, not now.  + cough w/ phlegm  -getting better. No sob, no wheezing. Popping left ear after blew nose. no ear pain.   No ear drainage. No ringing. No sig hearing loss.     Review of Systems   --no cp.     No Known Allergies  Current Outpatient Prescriptions on File Prior to Visit   Medication Sig Dispense Refill   . traZODone (DESYREL) 100 MG tablet TAKE 1/2 TABLET BY MOUTH DAILY AT BEDTIME 30 tablet 2   . omeprazole (PRILOSEC) 20 MG delayed release capsule Take 1 capsule by mouth Daily 30 capsule 11   . venlafaxine (EFFEXOR XR) 150 MG extended release capsule Take 1 capsule by mouth daily 30 capsule 3     No current facility-administered medications on file prior to visit.       Past Medical History:   Diagnosis Date   . Arthritis    . Atrial flutter (HCC)    .  Depression    . GERD (gastroesophageal reflux disease)    . Liver disease     HCV GT 1a/1b   . Rash    . Sleep disorder       Past Surgical History:   Procedure Laterality Date   . CHOLECYSTECTOMY     . FEMUR SURGERY Right     with hardware placement   . SALPINGECTOMY Bilateral 03/16/2017     Bilateral Salpingectomy     Social History     Tobacco History     Smoking Status  Current Every Day Smoker Smoking Frequency  0.5 packs/day for 20 years (10 pk yrs) Smoking Tobacco Type  Cigarettes    Smokeless Tobacco Use  Never Used    Tobacco Comment  Patient is a 1/2 pack a day smoker and has smoked since the age of 13 years          Alcohol History     Alcohol Use Status  No Comment  07/12/2014 sober date          Drug Use     Drug Use Status  No Comment  past IVDU heroin/snorted cocaine/ 07/11/2014 last use          Sexual Activity     Sexually Active  Yes Partners  Female Birth Control/Protection  OCP, Surgical Comment  TL               Family History   Problem Relation Age of Onset   . High  Blood Pressure Mother    . Depression Mother    . High Blood Pressure Father    . No Known Problems Sister    . No Known Problems Brother        Objective:     BP 124/84   Temp 97.9 F (36.6 C)   Ht 5' 2.25" (1.581 m)   Wt 193 lb (87.5 kg)   SpO2 96%   BMI 35.02 kg/m     Physical Exam   Constitutional:   NAD.  WD/WN.   HENT:   Oral MMM. Nasal turbinates swollen, + mucus.  Sinuses not sig ttp.  + Pharyngeal erythema.  No pharyngeal exudates. B/l TM's pearly gray - no TM rupture seen.    Eyes:   Conj clear.  PERRL, EOMI.   Neck:   Supple. No tender cervical LAD.   Cardiovascular:   RRR no m/r/g.  No LE edema b/l.     Pulmonary/Chest:   Nl Respiratory Effort, CTA b/l.     Abdominal:   +BS Soft.  NT.  ND.     Musculoskeletal:   Strength 5/5 b/l UE/LE.   Neurological:   Sensation intact.  CN II-XII intact.   Skin:   No new rash.  Good skin turgor.   Psychiatric:   A&Ox3.  Affect nl.         Wilnette Kales, MD 12/27/17 3:45 PM

## 2017-12-27 NOTE — Telephone Encounter (Signed)
S: Pt calling [facility] d/t sore throat  B: ongoing 3 weeks  A: c/o sore throat. Still able to swallow. C/o runny nose and bil ear fullness. Denies ear pain. Taking benadryl and zyrtec. No improvement to symptoms  R: disposition see today in office- no appts available with Dr Dory Peruhung. Scheduled acute visit with Dr Scarlett PrestoMakati today at 1140am. Pt verbalized understanding          Reason for Disposition  . SEVERE sore throat pain    Protocols used: SORE THROAT-ADULT-[place]

## 2017-12-28 ENCOUNTER — Encounter: Attending: Internal Medicine | Primary: Internal Medicine

## 2018-01-23 ENCOUNTER — Encounter

## 2018-01-23 MED ORDER — IPRATROPIUM BROMIDE 0.03 % NA SOLN
0.03 | NASAL | 11 refills | Status: DC
Start: 2018-01-23 — End: 2018-06-01

## 2018-02-28 ENCOUNTER — Encounter

## 2018-02-28 MED ORDER — TRAZODONE HCL 100 MG PO TABS
100 | ORAL_TABLET | ORAL | 5 refills | Status: DC
Start: 2018-02-28 — End: 2018-04-09

## 2018-02-28 NOTE — Telephone Encounter (Signed)
12/27/17

## 2018-04-09 ENCOUNTER — Encounter

## 2018-04-10 ENCOUNTER — Encounter

## 2018-04-10 ENCOUNTER — Ambulatory Visit
Admit: 2018-04-10 | Discharge: 2018-04-16 | Payer: PRIVATE HEALTH INSURANCE | Attending: Internal Medicine | Primary: Internal Medicine

## 2018-04-10 DIAGNOSIS — L309 Dermatitis, unspecified: Secondary | ICD-10-CM

## 2018-04-10 MED ORDER — BETAMETHASONE DIPROPIONATE 0.05 % EX OINT
0.05 | CUTANEOUS | 1 refills | Status: DC
Start: 2018-04-10 — End: 2020-03-04

## 2018-04-10 MED ORDER — OMEPRAZOLE 40 MG PO CPDR
40 MG | ORAL_CAPSULE | Freq: Every day | ORAL | 3 refills | Status: DC
Start: 2018-04-10 — End: 2018-08-07

## 2018-04-10 MED ORDER — TRAZODONE HCL 100 MG PO TABS
100 MG | ORAL_TABLET | ORAL | 11 refills | Status: DC
Start: 2018-04-10 — End: 2019-04-22

## 2018-04-10 NOTE — Telephone Encounter (Signed)
Last ov 12/27/17

## 2018-04-10 NOTE — Progress Notes (Addendum)
Subjective:  Alyssa Freeman is a 37 y.o. female who presents with chief complaint of Eczema (on bilat hands)      HPI:  Dermatitis- Here for dry/ cracked skin on her palms. Areas are cracked, painful, and  occasionally bleed. Started about 3 weeks ago. 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 She wears gloves but is constantly dealing with moisture on her hands. She had an old betamethasone cream at home that she tried and it helped significantly , but now she is out. She has also been using a palmers cocoa butter lotion and it helps.     Alyssa Freeman- having some GERD issues at night and takes her prilosec in the morning. She has been taking an extra pill at night when this happens with good results. Would like her script modified or changed to reflect.     Current Meds: reviewed below  Current Outpatient Medications   Medication Sig Dispense Refill   . traZODone (DESYREL) 100 MG tablet TAKE 1/2 TABLET BY MOUTH DAILY AT BEDTIME 30 tablet 11   . betamethasone dipropionate (DIPROLENE) 0.05 % ointment Apply topically 2 times daily. 1 Tube 1   . omeprazole (PRILOSEC) 40 MG delayed release capsule Take 1 capsule by mouth daily 30 capsule 3   . venlafaxine (EFFEXOR XR) 150 MG extended release capsule Take 1 capsule by mouth daily 30 capsule 3   . ipratropium (ATROVENT) 0.03 % nasal spray INSTILL 2 SPRAYS INTO EACH NOSTRIL THREE TIMES A DAY AS NEEDED FOR NASAL CONGESTION FOR 5 DAYS 30 mL 11   . Dextromethorphan-Guaifenesin (MUCINEX DM) 30-600 MG TB12 Take 1 tablet by mouth 2 times daily as needed (Cough) 20 tablet 0     No current facility-administered medications for this visit.          Past Medical History:   Diagnosis Date   . Arthritis    . Atrial flutter (HCC)    . Depression    . GERD (gastroesophageal reflux disease)    . Liver disease     HCV GT 1a/1b   . Rash    . Sleep disorder      family history includes Depression in her mother; High Blood Pressure in her father and mother; No Known Problems in her brother and  sister.   reports that she has been smoking cigarettes.  She has a 10.00 pack-year smoking history. She has never used smokeless tobacco.    Review of Systems   Constitutional: Negative for activity change, appetite change and fatigue.   Respiratory: Negative for cough, chest tightness and shortness of breath.    Cardiovascular: Negative for chest pain and palpitations.   Gastrointestinal: Positive for abdominal pain. Negative for constipation, diarrhea and nausea.        Having reflux issues occasionally at night    Skin: Positive for rash (on bil palms).       Objective:  Wt Readings from Last 3 Encounters:   04/10/18 190 lb (86.2 kg)   12/27/17 193 lb (87.5 kg)   10/03/17 192 lb (87.1 kg)       Vitals:    04/10/18 1056   BP: 118/74   Weight: 190 lb (86.2 kg)   Height: 5' 2.25" (1.581 m)       Physical Exam   Constitutional: She is oriented to person, place, and time. She appears well-developed and well-nourished.   HENT:   Head: Normocephalic.   Cardiovascular: Normal rate, regular rhythm and normal heart sounds.  Pulmonary/Chest: Effort normal and breath sounds normal. She has no wheezes. She has no rales.   Abdominal: Soft. Bowel sounds are normal.   Neurological: She is alert and oriented to person, place, and time.   Skin: Rash noted.   Psychiatric: She has a normal mood and affect.         ASSESSMENT/PLAN:    1. Dermatitis  - betamethasone dipropionate (DIPROLENE) 0.05 % ointment; Apply topically 2 times daily.  Dispense: 1 Tube; Refill: 1  - Comprehensive Metabolic Panel; Future    2. Gastroesophageal reflux disease without esophagitis  - omeprazole (PRILOSEC) 40 MG delayed release capsule; Take 1 capsule by mouth daily  Dispense: 30 capsule; Refill: 3  - CBC; Future    3. Screening for hypercholesterolemia  - Lipid Panel; Future      Additional   Plans:   1: Continue  medications for:  Dermatitis- sparingly use cream. Continue to use lotion. Gerd- increase prilosec to 40mg  daily.  patient is stable     If  any newmedication listed above was given by me I did discuss this medication with the patient and provided education and went over the most common side effects and cautions of the medication and patient demonstrates understanding    Goals     None          Medications Discontinued During This Encounter   Medication Reason   . omeprazole (PRILOSEC) 20 MG delayed release capsule    . benzonatate (TESSALON PERLES) 100 MG capsule          FOLLOWUP      Return in about 6 months (around 10/10/2018).        Electronically signed by Tommy MedalMatthew Genecis Veley, MD on 04/10/18 at 11:18 AM

## 2018-04-11 LAB — LIPID PANEL
Chol/HDL Ratio: 6 NA
Cholesterol: 226 mg/dL — AB (ref <200)
HDL: 38 mg/dL — ABNORMAL LOW (ref 40–60)
LDL Cholesterol: 143 mg/dL — AB (ref <100)
Triglycerides: 223 mg/dL — AB (ref <150)

## 2018-04-11 LAB — COMPREHENSIVE METABOLIC PANEL
ALT: 22 U/L (ref 13–69)
AST: 19 U/L (ref 15–46)
Albumin,Serum: 4.1 g/dL (ref 3.5–5.0)
Alkaline Phosphatase: 69 U/L (ref 38–126)
Anion Gap: 8 NA
BUN: 14 mg/dL (ref 7–20)
CO2: 22 mmol/L (ref 22–30)
Calcium: 9.4 mg/dL (ref 8.4–10.4)
Chloride: 104 mmol/L (ref 98–107)
Creatinine: 0.75 mg/dL (ref 0.52–1.25)
EGFR IF NonAfrican American: >60 mL/min (ref >60)
Glucose: 91 mg/dL (ref 70–100)
Potassium: 4.3 mmol/L (ref 3.5–5.1)
Sodium: 134 mmol/L — ABNORMAL LOW (ref 135–145)
Total Bilirubin: 0.5 mg/dL (ref 0.2–1.3)
Total Protein: 7.1 g/dL (ref 6.3–8.2)
eGFR African American: >60 mL/min (ref >60)

## 2018-04-11 LAB — CBC
Hematocrit: 42.3 % (ref 35.0–47.0)
Hemoglobin: 14.3 g/dL (ref 11.7–16.0)
MCH: 29.9 pg (ref 26.0–34.0)
MCHC: 33.9 % (ref 32.0–36.0)
MCV: 88.3 fL (ref 79.0–98.0)
MPV: 7.4 fL (ref 7.4–10.4)
Platelets: 272 10*3/uL (ref 140–440)
RBC: 4.79 10*6/uL (ref 3.80–5.20)
RDW: 13.7 % (ref 11.5–14.5)
WBC: 8.6 10*3/uL (ref 3.6–10.7)

## 2018-04-13 NOTE — Addendum Note (Signed)
Addended by: Guido SanderHUNG, Dimitri Dsouza S on: 04/13/2018 09:57 AM     Modules accepted: Level of Service

## 2018-05-31 NOTE — Telephone Encounter (Signed)
S  Patient calling [facility] with complaint of hidradenitis  B  Ongoing X 2 weeks  A  Patient states that she has boils underneath her left breast and under both armpits.  She states that they are red and tender to the touch.  She denies drainage or fever.  She rates the pain as a 2 or 3.  She states that the size of the one under her breast is the size of a half dollar.   R  See physician within 24 hours.  Appointment scheduled with Dr. Scarlett PrestoMakati 06/01/18 at 240 pm insurance was verified.  Patient cautioned to not squeeze the boils. Apply moist heat.  To call back if symptoms worsen or new symptoms develop.  Patient verbalized understanding.     Reason for Disposition  ??? 2 or more boils    Protocols used: BOIL (SKIN ABSCESS)-ADULT-AH

## 2018-05-31 NOTE — Telephone Encounter (Signed)
Patient scheduled.

## 2018-06-01 ENCOUNTER — Ambulatory Visit
Admit: 2018-06-01 | Discharge: 2018-06-01 | Payer: PRIVATE HEALTH INSURANCE | Attending: Internal Medicine | Primary: Internal Medicine

## 2018-06-01 DIAGNOSIS — L0291 Cutaneous abscess, unspecified: Secondary | ICD-10-CM

## 2018-06-01 MED ORDER — DOXYCYCLINE HYCLATE 100 MG PO TABS
100 MG | ORAL_TABLET | Freq: Two times a day (BID) | ORAL | 0 refills | Status: AC
Start: 2018-06-01 — End: 2018-06-08

## 2018-06-01 MED ORDER — FLUCONAZOLE 150 MG PO TABS
150 MG | ORAL_TABLET | Freq: Once | ORAL | 0 refills | Status: AC | PRN
Start: 2018-06-01 — End: 2018-06-01

## 2018-06-01 MED ORDER — CEPHALEXIN 500 MG PO CAPS
500 MG | ORAL_CAPSULE | Freq: Two times a day (BID) | ORAL | 0 refills | Status: DC
Start: 2018-06-01 — End: 2019-09-13

## 2018-06-01 NOTE — Progress Notes (Signed)
Patient: Alyssa PartridgeRachel Edmondson is a 37 y.o. female     Chief Complaint   Patient presents with   ??? Other     abscess       Assessment And Plan      Diagnosis Orders   1. Abscess  doxycycline hyclate (VIBRA-TABS) 100 MG tablet    cephALEXin (KEFLEX) 500 MG capsule       --Lt abdominal wall area abscess: Hx of occ abscesses/hidradeneitis. Non-toxic appearing, ok to first try outpt Rx.  Rx w/ doxy + keflex - discussed w/ Dr. Dory Peruhung.  Patient explained while on doxycycline to be upright x 1 hr. after taking it to avoid esophagitis/heartburn & to be cautious in the sun due to possible sun sensitivity.  No tanning on doxy. Recommend otc probiotic while on antibiotics.  Monitor area closely - if worsening, if erythema spreads, if not getting better, then go to ED.  Pt also requesting diflucan prn yeast infxn if she gets on atbx. Pt explained that if gets frequent abscesses in future, discuss w/ Dr. Dory Peruhung re: MRSA colonization testing and hibiclens soap.         Patient's Medications   New Prescriptions    CEPHALEXIN (KEFLEX) 500 MG CAPSULE    Take 1 capsule by mouth 2 times daily    DOXYCYCLINE HYCLATE (VIBRA-TABS) 100 MG TABLET    Take 1 tablet by mouth 2 times daily for 7 days    FLUCONAZOLE (DIFLUCAN) 150 MG TABLET    Take 1 tablet by mouth once as needed (Yeast infection.)   Previous Medications    BETAMETHASONE DIPROPIONATE (DIPROLENE) 0.05 % OINTMENT    Apply topically 2 times daily.    OMEPRAZOLE (PRILOSEC) 40 MG DELAYED RELEASE CAPSULE    Take 1 capsule by mouth daily    TRAZODONE (DESYREL) 100 MG TABLET    TAKE 1/2 TABLET BY MOUTH DAILY AT BEDTIME    VENLAFAXINE (EFFEXOR XR) 150 MG EXTENDED RELEASE CAPSULE    Take 1 capsule by mouth daily   Modified Medications    No medications on file   Discontinued Medications    DEXTROMETHORPHAN-GUAIFENESIN (MUCINEX DM) 30-600 MG TB12    Take 1 tablet by mouth 2 times daily as needed (Cough)    IPRATROPIUM (ATROVENT) 0.03 % NASAL SPRAY    INSTILL 2 SPRAYS INTO EACH NOSTRIL THREE TIMES  A DAY AS NEEDED FOR NASAL CONGESTION FOR 5 DAYS       If anynew medications/maintainence medications listed above were given by me, I did discuss this medication with the patient and provided side effects, interactions, and consequences of taking the medication.  All patient'squestions answered and patient demonstrates understanding.    Goals     None          Subjective:     HPI   --Started 2 weeks ago. Tiny lump in area - has been getting bigger, red, painful to touch.  No drainage.  No f/c.  No bites/scratches.  Has hx of abscesses - last 1 about a year ago.  GERD under control.     Review of Systems   --no cp.     No Known Allergies  Current Outpatient Medications on File Prior to Visit   Medication Sig Dispense Refill   ??? traZODone (DESYREL) 100 MG tablet TAKE 1/2 TABLET BY MOUTH DAILY AT BEDTIME 30 tablet 11   ??? betamethasone dipropionate (DIPROLENE) 0.05 % ointment Apply topically 2 times daily. 1 Tube 1   ??? omeprazole (PRILOSEC) 40  MG delayed release capsule Take 1 capsule by mouth daily 30 capsule 3   ??? venlafaxine (EFFEXOR XR) 150 MG extended release capsule Take 1 capsule by mouth daily 30 capsule 3     No current facility-administered medications on file prior to visit.       Past Medical History:   Diagnosis Date   ??? Arthritis    ??? Atrial flutter (HCC)    ??? Depression    ??? GERD (gastroesophageal reflux disease)    ??? Liver disease     HCV GT 1a/1b   ??? Rash    ??? Sleep disorder       Past Surgical History:   Procedure Laterality Date   ??? CHOLECYSTECTOMY     ??? FEMUR SURGERY Right     with hardware placement   ??? SALPINGECTOMY Bilateral 03/16/2017     Bilateral Salpingectomy     Social History     Tobacco Use   ??? Smoking status: Current Every Day Smoker     Packs/day: 0.50     Years: 20.00     Pack years: 10.00     Types: Cigarettes   ??? Smokeless tobacco: Never Used   ??? Tobacco comment: Patient is a 1/2 pack a day smoker and has smoked since the age of 13 years   Substance Use Topics   ??? Alcohol use: No      Alcohol/week: 0.0 oz     Comment: 07/12/2014 sober date   ??? Drug use: No     Comment: past IVDU heroin/snorted cocaine/ 07/11/2014 last use      Family History   Problem Relation Age of Onset   ??? High Blood Pressure Mother    ??? Depression Mother    ??? High Blood Pressure Father    ??? No Known Problems Sister    ??? No Known Problems Brother        Objective:     BP 120/73    Pulse 91    Ht 5' 2.25" (1.581 m)    Wt 183 lb 6.4 oz (83.2 kg)    SpO2 98%    BMI 33.28 kg/m??     Physical Exam   Constitutional:   NAD.  WD/WN.   HENT:   Oral MMM.   Eyes:   Conj clear.  PERRL, EOMI.   Neck:   Supple.   Cardiovascular:   RRR no m/r/g.  No LE edema b/l.     Pulmonary/Chest:   Nl Respiratory Effort, CTA b/l.     Abdominal:   +BS Soft.  NT.  ND.     Musculoskeletal:   Strength 5/5 b/l UE/LE.   Neurological:   Sensation intact.  CN II-XII intact.   Skin:   Lt abdominal wall area: + abscess - Area with erythema & induration - doesn't palpate to very deep. No overt fluctuance felt. No drainage  A little ttp.  No streaking erythema.  Good skin turgor.   Psychiatric:   A&Ox3.  Affect nl.         Wilnette Kales, MD 06/01/18 3:28 PM

## 2018-08-07 ENCOUNTER — Encounter

## 2018-08-07 MED ORDER — VENLAFAXINE HCL ER 150 MG PO CP24
150 MG | ORAL_CAPSULE | ORAL | 11 refills | Status: DC
Start: 2018-08-07 — End: 2019-08-02

## 2018-08-07 MED ORDER — OMEPRAZOLE 40 MG PO CPDR
40 | ORAL_CAPSULE | ORAL | 3 refills | Status: DC
Start: 2018-08-07 — End: 2019-08-02

## 2018-08-07 NOTE — Telephone Encounter (Signed)
Last ov 06/01/18

## 2018-10-16 ENCOUNTER — Encounter: Attending: Internal Medicine | Primary: Internal Medicine

## 2019-03-28 MED ORDER — NICOTINE 21 MG/24HR TD PT24
21 MG/24HR | MEDICATED_PATCH | Freq: Every day | TRANSDERMAL | 0 refills | Status: DC
Start: 2019-03-28 — End: 2019-10-16

## 2019-03-28 NOTE — Telephone Encounter (Signed)
Pt. Has been notified

## 2019-03-28 NOTE — Telephone Encounter (Signed)
Name of Caller: Alyssa Freeman    Relationship to Patient: self    Contact phone number:  [phone]    Medication Name: patient would like to have nicotine patches prescribed; please advise    If this is a controlled substance do you receive this or any other controlled medication from any other doctor or facility? N/A    Date of Last Refill: (See Medication Tab):     Date of last office visit: 06/01/18    Date of next office visit: none scheduled    Ordering Physician: Dory Peru      Updated/Validated Preferred Pharmacy: Yes    Patient instructed to contact the pharmacy prior to picking up the medication: Yes

## 2019-03-28 NOTE — Telephone Encounter (Signed)
rx sent in notify patient

## 2019-04-20 ENCOUNTER — Encounter

## 2019-04-22 MED ORDER — TRAZODONE HCL 100 MG PO TABS
100 MG | ORAL_TABLET | ORAL | 11 refills | Status: DC
Start: 2019-04-22 — End: 2020-03-13

## 2019-04-22 NOTE — Telephone Encounter (Signed)
Last ov 06/01/2018

## 2019-08-02 ENCOUNTER — Encounter

## 2019-08-02 MED ORDER — VENLAFAXINE HCL ER 150 MG PO CP24
150 MG | ORAL_CAPSULE | ORAL | 11 refills | Status: DC
Start: 2019-08-02 — End: 2019-09-13

## 2019-08-02 MED ORDER — OMEPRAZOLE 40 MG PO CPDR
40 MG | ORAL_CAPSULE | ORAL | 11 refills | Status: DC
Start: 2019-08-02 — End: 2020-07-28

## 2019-08-02 NOTE — Telephone Encounter (Signed)
Last ov 06/01/2018

## 2019-09-12 NOTE — Telephone Encounter (Signed)
Pt has a VV with you tomorrow at 10am.  Alyssa Freeman

## 2019-09-12 NOTE — Telephone Encounter (Signed)
S:Pt called [facility] with concerns of diarrhea   B:Pt states x 3-4 months' A:Pt states has IBS and is having 4-5 loose stools daily. Pt denies fever/ abdominal pain/ cramps/ blood or mucous in stools. Pt states has lost 12 pounds over 3-4 months. Pt states using imodium with little relief  R: Pt scheduled for a virtual visit with Dr Dory Peru tomorrow at 1000. Insurance verified, Allergies and pharmacy verified. Pt provided with Doxy sign on information. Pt verbalized understadmnding.     Reason for Disposition  ??? MODERATE diarrhea (e.g., 4-6 times / day more than normal) and present > 48 hours (2 days)    Protocols used: DIARRHEA-ADULT-[place]

## 2019-09-13 ENCOUNTER — Telehealth
Admit: 2019-09-13 | Discharge: 2019-09-13 | Payer: PRIVATE HEALTH INSURANCE | Attending: Internal Medicine | Primary: Internal Medicine

## 2019-09-13 DIAGNOSIS — R197 Diarrhea, unspecified: Secondary | ICD-10-CM

## 2019-09-13 NOTE — Progress Notes (Signed)
Subjective:  Alyssa Freeman is a 38 y.o. female who presents with chief complaint of Gastroesophageal Reflux (Patient states that everytime she eats after an hour later she has to go to the bathroom.)      HPI:  Patient was seen today via Telehealth by agreement and consent in light of the current COVID-19 pandemic. I used the following Telehealth technology: Audio and video capabilities. This patient encounter is appropriate and reasonable under the circumstances given the patient's particular presentation at this time. The patient has been advised of the potential risks and limitations of this mode of treatment (including but not limited to the absence of in-person examination) and has agreed to be treated in a remote fashion in spite of them. Any and all of the patient's/patient's family's questions on this issue have been answered and I have made no promises or guarantees to the patient. The patient has also been advised to contact this office for worsening conditions or problems, and seek emergency medical treatment and/or call 911 if the patient deems either necessary.    Diarrhea: patient has had recurrent BM  And 5 times/day + well water     Below are 4  chronic stable problems addressed:   1. GERD: patient states she is having multiple BM + ppi   2.   Depression: patient is takingher trazadone   3.   Dermatitis: patient is stable on her cream    4.  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 omeprazole (PRILOSEC) 40 MG delayed release capsule TAKE 1 CAPSULE BY MOUTH EVERY DAY 30 capsule 11   . traZODone (DESYREL) 100 MG tablet TAKE 1/2 TABLET BY MOUTH DAILY AT BEDTIME 30 tablet 11   . nicotine (NICODERM CQ) 21 MG/24HR Place 1 patch onto the skin daily 42 patch 0   . venlafaxine (EFFEXOR XR) 150 MG extended release capsule Take 1 capsule by mouth daily 30 capsule 3   . betamethasone dipropionate (DIPROLENE) 0.05 % ointment  Apply topically 2 times daily. (Patient not taking: Reported on 09/13/2019) 1 Tube 1     No current facility-administered medications for this visit.          Past Medical History:   Diagnosis Date   . Abnormal EKG 01/29/2016   . Arthritis    . Atrial flutter (HCC)    . Depression    . Dermatitis 04/10/2018   . GERD (gastroesophageal reflux disease)    . Liver disease     HCV GT 1a/1b   . Rash    . Sleep disorder      family history includes Depression in her mother; High Blood Pressure in her father and mother; No Known Problems in her brother and sister.   reports that she has been smoking cigarettes. She has a 10.00 pack-year smoking history. She has never used smokeless tobacco.    Review of Systems   Constitutional: Negative for chills and fever.   Respiratory: Negative for cough and shortness of breath.    Cardiovascular: Negative for chest pain and palpitations.   Gastrointestinal: Negative for diarrhea, nausea and vomiting.       Objective:  Wt Readings from Last 3 Encounters:   06/01/18 183 lb 6.4 oz (83.2 kg)   04/10/18 190 lb (86.2 kg)   12/27/17 193 lb (87.5 kg)       There were no vitals filed for this  visit.    Physical Exam  Constitutional:       General: She is not in acute distress.     Appearance: Normal appearance. She is well-developed. She is not ill-appearing or toxic-appearing.      Comments: Patient does not appear SOB or in any distress    HENT:      Head: Normocephalic and atraumatic.   Pulmonary:      Effort: Pulmonary effort is normal.   Skin:     General: Skin is warm and dry.   Neurological:      Mental Status: She is alert and oriented to person, place, and time. Mental status is at baseline.   Psychiatric:         Mood and Affect: Mood normal.         Behavior: Behavior normal.           ASSESSMENT/PLAN:    1. Diarrhea, unspecified type  - Gastrointestinal Panel by DNA; Future  - CBC Auto Differential; Future  - [facility] [place]    2. Gastroesophageal reflux disease without  esophagitis    3. Dermatitis    4. Dysthymia    5. History of heroin abuse ([place])      Additional   Plans:   1: Continue chronic medications for:   GERD: ppi Dermatitis: crea,m  patient is stable     If any newmedication listed above was given by me I did discuss this medication with the patient and provided education and went over the most common side effects and cautions of the medication and patient demonstrates understanding    Goals    None         Medications Discontinued During This Encounter   Medication Reason   . cephALEXin (KEFLEX) 500 MG capsule LIST CLEANUP   . venlafaxine (EFFEXOR XR) 150 MG extended release capsule          FOLLOWUP      Return in about 6 months (around 03/12/2020).    Electronically signed by Cleophas Dunker, MD on 09/13/19 at 10:06 AM EDT

## 2019-09-16 ENCOUNTER — Encounter

## 2019-09-17 LAB — CBC WITH AUTO DIFFERENTIAL
Absolute Baso #: 0.1 10*3/uL (ref 0.0–0.2)
Absolute Eos #: 0 10*3/uL (ref 0.0–0.5)
Absolute Lymph #: 1.9 10*3/uL (ref 1.0–4.3)
Absolute Mono #: 0.4 10*3/uL (ref 0.0–0.8)
Absolute Neut #: 7.1 10*3/uL — ABNORMAL HIGH (ref 1.8–7.0)
Basophils: 0.8 % (ref 0.0–2.0)
Eosinophils: 0.5 % — ABNORMAL LOW (ref 1.0–6.0)
Granulocytes %: 74.1 % (ref 40.0–80.0)
Hematocrit: 43 % (ref 35.0–47.0)
Hemoglobin: 14.1 g/dL (ref 11.7–16.0)
Lymphocyte %: 20.2 % (ref 20.0–40.0)
MCH: 29 pg (ref 26.0–34.0)
MCHC: 32.9 % (ref 32.0–36.0)
MCV: 88.4 fL (ref 79.0–98.0)
MPV: 7.9 fL (ref 7.4–10.4)
Monocytes: 4.4 % (ref 2.0–10.0)
Platelets: 364 10*3/uL (ref 140–440)
RBC: 4.86 10*6/uL (ref 3.80–5.20)
RDW: 14.6 % — ABNORMAL HIGH (ref 11.5–14.5)
WBC: 9.6 10*3/uL (ref 3.6–10.7)

## 2019-09-18 ENCOUNTER — Encounter

## 2019-09-19 LAB — GASTROINTESTINAL PANEL, MOLECULAR: Gastrointestinal PCR Panel: NEGATIVE

## 2019-10-16 ENCOUNTER — Ambulatory Visit
Admit: 2019-10-16 | Discharge: 2019-10-16 | Payer: PRIVATE HEALTH INSURANCE | Attending: Adult Health | Primary: Internal Medicine

## 2019-10-16 DIAGNOSIS — K529 Noninfective gastroenteritis and colitis, unspecified: Secondary | ICD-10-CM

## 2019-10-16 MED ORDER — PEG 3350-KCL-NABCB-NACL-NASULF 236 G PO SOLR
236 g | Freq: Once | ORAL | 0 refills | Status: AC
Start: 2019-10-16 — End: 2019-10-16

## 2019-10-16 MED ORDER — CHOLESTYRAMINE 4 G PO PACK
4 g | PACK | Freq: Two times a day (BID) | ORAL | 1 refills | Status: DC
Start: 2019-10-16 — End: 2019-12-11

## 2019-10-16 NOTE — Progress Notes (Signed)
[facility]  [facility]  [address]  [place] [id number]  Dept: [phone]  Dept Fax: [phone]  Loc: [phone]      Alyssa PartridgeRachel Freeman is a 38 y.o. female who presents to day with Diarrhea.    Subjective    Chief Complaint   Patient presents with   . Diarrhea       HPI    Diarrhea- associated with urgency. Started ~ 3 months ago. 5-6 loose stools. Woke up in morning with loose stool. No abdominal pain. No rectal pain, bleeding or black stools. Stools looked mucousy and yellow. Weight is down, lost 12 lbs in the past 3 months. No antibiotics prior to onset. GI panel negative.   Denies ETOH or IVDU.     Imodium did not help.  Metamucil thickened it up but remained urgent and frequent    Cholecystectomy ~ 6 years ago.       Allergies:  Patient has no known allergies.    Current Medications:    Current Outpatient Medications:   .  cholestyramine (QUESTRAN) 4 g packet, Take 1 packet by mouth 2 times daily, Disp: 60 packet, Rfl: 1  .  omeprazole (PRILOSEC) 40 MG delayed release capsule, TAKE 1 CAPSULE BY MOUTH EVERY DAY, Disp: 30 capsule, Rfl: 11  .  traZODone (DESYREL) 100 MG tablet, TAKE 1/2 TABLET BY MOUTH DAILY AT BEDTIME, Disp: 30 tablet, Rfl: 11  .  betamethasone dipropionate (DIPROLENE) 0.05 % ointment, Apply topically 2 times daily., Disp: 1 Tube, Rfl: 1  .  venlafaxine (EFFEXOR XR) 150 MG extended release capsule, Take 1 capsule by mouth daily, Disp: 30 capsule, Rfl: 3    Past Medical History:   Diagnosis Date   . Abnormal EKG 01/29/2016   . Arthritis    . Atrial flutter (HCC)    . Depression    . Dermatitis 04/10/2018   . GERD (gastroesophageal reflux disease)    . Liver disease     HCV GT 1a/1b   . Rash    . Sleep disorder        Past Surgical History:   Procedure Laterality Date   . CHOLECYSTECTOMY     . FEMUR SURGERY Right     with hardware placement   . SALPINGECTOMY Bilateral 03/16/2017     Bilateral Salpingectomy       Family History    Problem Relation Age of Onset   . High Blood Pressure Mother    . Depression Mother    . High Blood Pressure Father    . No Known Problems Sister    . No Known Problems Brother        Social History     Socioeconomic History   . Marital status: Single     Spouse name: None   . Number of children: None   . Years of education: None   . Highest education level: None   Occupational History   . None   Social Needs   . Financial resource strain: None   . Food insecurity     Worry: None     Inability: None   . Transportation needs     Medical: None     Non-medical: None   Tobacco Use   . Smoking status: Current Every Day Smoker     Packs/day: 0.50     Years: 20.00     Pack years: 10.00     Types: Cigarettes   .  Smokeless tobacco: Never Used   . Tobacco comment: Patient is a 1/2 pack a day smoker and has smoked since the age of 29 years   Substance and Sexual Activity   . Alcohol use: No     Alcohol/week: 0.0 standard drinks     Comment: 07/12/2014 sober date   . 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 Sexual activity: Yes     Partners: Male     Birth control/protection: OCP, Surgical     Comment: TL   Lifestyle   . Physical activity     Days per week: None     Minutes per session: None   . Stress: None   Relationships   . Social Product manager on phone: None     Gets together: None     Attends religious service: None     Active member of club or organization: None     Attends meetings of clubs or organizations: None     Relationship status: None   . Intimate partner violence     Fear of current or ex partner: None     Emotionally abused: None     Physically abused: None     Forced sexual activity: None   Other Topics Concern   . None   Social History Narrative   . None         Review of Systems   Constitutional: Negative for appetite change, chills, diaphoresis, fatigue, fever and unexpected weight change.   HENT: Negative for congestion, trouble swallowing and voice  change.    Eyes: Negative for visual disturbance.   Respiratory: Negative for cough, choking, shortness of breath and wheezing.    Cardiovascular: Negative for chest pain, palpitations and leg swelling.   Gastrointestinal: Positive for diarrhea. Negative for abdominal distention, abdominal pain, anal bleeding, blood in stool, constipation, nausea, rectal pain and vomiting.   Genitourinary: Negative for decreased urine volume, dysuria, frequency, hematuria and urgency.   Musculoskeletal: Negative for arthralgias and gait problem.   Skin: Negative for rash and wound.   Neurological: Negative for dizziness, syncope, weakness, light-headedness and headaches.   Psychiatric/Behavioral: Negative for agitation, behavioral problems, confusion, dysphoric mood and sleep disturbance.        Objective    BP 132/82   Pulse 80   Temp 97.2 F (36.2 C)   Ht 5' 2.25" (1.581 m)   Wt 170 lb (77.1 kg)   SpO2 98%   BMI 30.84 kg/m     Physical Exam  Vitals signs reviewed.   Constitutional:       General: She is not in acute distress.     Appearance: Normal appearance. She is well-developed. She is not ill-appearing, toxic-appearing or diaphoretic.   HENT:      Head: Normocephalic and atraumatic.   Eyes:      General: No scleral icterus.     Extraocular Movements: Extraocular movements intact.      Pupils: Pupils are equal, round, and reactive to light.   Neck:      Musculoskeletal: Neck supple.      Thyroid: No thyromegaly.   Cardiovascular:      Rate and Rhythm: Normal rate and regular rhythm.      Heart sounds: Normal heart sounds. No murmur. No gallop.    Pulmonary:      Effort: No respiratory distress.      Breath sounds: Normal breath  sounds. No rales.   Abdominal:      General: Abdomen is flat. Bowel sounds are normal. There is no distension or abdominal bruit.      Palpations: Abdomen is soft. There is no mass.      Tenderness: There is no abdominal tenderness. There is no guarding or rebound.      Hernia: No hernia is  present.   Musculoskeletal: Normal range of motion.         General: No tenderness.   Lymphadenopathy:      Cervical: No cervical adenopathy.   Skin:     General: Skin is warm and dry.      Coloration: Skin is not jaundiced or pale.      Findings: No rash.   Neurological:      General: No focal deficit present.      Mental Status: She is alert and oriented to person, place, and time.      Coordination: Coordination normal.   Psychiatric:         Mood and Affect: Mood normal.         Behavior: Behavior normal.          Assessment and Plan     Diagnosis Orders   1. Chronic diarrhea  polyethylene glycol (GOLYTELY) 236 g solution    COLONOSCOPY (Diagnostic)    cholestyramine (QUESTRAN) 4 g packet       Orders Placed This Encounter   Procedures   . COLONOSCOPY (Diagnostic)     Order Specific Question:   Screening or Diagnostic?     Answer:   Diagnostic   . Initiate PAT Protocol     Standing Status:   Future     Standing Expiration Date:   02/17/2020        Problem List Items Addressed This Visit     Chronic diarrhea - Primary     Start Questran at HS- may increase to BID  ? Bile salt induced diarrhea  If no improvement with questran consider trial of Creon  Colonoscopy           Relevant Medications    cholestyramine (QUESTRAN) 4 g packet    Other Relevant Orders    COLONOSCOPY (Diagnostic)              Health Maintenance   Topic Date Due   . Varicella vaccine (1 of 2 - 2-dose childhood series) 03/25/1980   . DTaP/Tdap/Td vaccine (1 - Tdap) 03/25/1998   . Flu vaccine (1) 08/27/2019   . Cervical cancer screen  10/25/2021   . Lipid screen  04/11/2023   . Pneumococcal 0-64 years Vaccine  Completed   . HIV screen  Completed   . Hepatitis A vaccine  Aged Out   . Hepatitis B vaccine  Aged Out   . Hib vaccine  Aged Out   . Meningococcal (ACWY) vaccine  Aged Out     Return in about 8 weeks (around 12/11/2019).  Tommy Medal, MD  Tommy Medal, MD        This note was signed by Barbarann Ehlers, APRN - CNP at 3:57 PM on 10/17/19  .

## 2019-10-16 NOTE — Patient Instructions (Addendum)
Patient Education        Irritable Bowel Syndrome: Care Instructions  Your Care Instructions  Irritable bowel syndrome, or IBS, is a problem with the intestines that causes belly pain, bloating, gas, constipation, and diarrhea. The cause of IBS is not well known. IBS can last for many years, but it does not get worse over time or lead to serious disease.  Most people can control their symptoms by changing their diet and reducing stress.  Follow-up care is a key part of your treatment and safety. Be sure to make and go to all appointments, and call your doctor if you are having problems. It's also a good idea to know your test results and keep a list of the medicines you take.  How can you care for yourself at home?   Prevent diarrhea:  ? Limit the amount of high-fiber foods you eat. This includes vegetables, fruits, whole-grain breads and pasta, high-fiber cereal, and brown rice.  ? Limit dairy products.  ? Limit artificial sweeteners such as sorbitol and xylitol.   Avoid constipation:  ? Include fruits, vegetables, beans, and whole grains in your diet each day. These foods are high in fiber.  ? Drink plenty of fluids. If you have kidney, heart, or liver disease and have to limit fluids, talk with your doctor before you increase the amount of fluids you drink.  ? Get some exercise every day. Build up slowly to 30 to 60 minutes a day on 5 or more days of the week.  ? Take a fiber supplement, such as Citrucel or Metamucil, every day if needed. Read and follow all instructions on the label.  ? Schedule time each day for a bowel movement. Having a daily routine may help. Take your time and do not strain when having a bowel movement.   To help relieve bloating or gas, avoid foods such as beans, cabbage, cauliflower, or broccoli.   Keep a daily diary of what you eat and what symptoms you have. This may help find foods that cause you problems.   Eat slowly. Try to make mealtime relaxing.   Find ways to reduce  stress.  When should you call for help?   Call your doctor now or seek immediate medical care if:    Your pain is different than usual or occurs with fever.     You lose weight without trying, or you lose your appetite and you do not know why.     Your symptoms often wake you from sleep.     Your stools are black and tarlike or have streaks of blood.   Watch closely for changes in your health, and be sure to contact your doctor if:    Your IBS symptoms get worse or begin to disrupt your day-to-day life.     You become more tired than usual.     Your home treatment stops working.   Where can you learn more?  Go to [email or web address] and sign in to your MyChart account. Enter [phone] in the Search Health Information box to learn more about "Irritable Bowel Syndrome: Care Instructions."     If you do not have an account, please click on the "Sign Up Now" link.  Current as of: April 15, 2020Content Version: 12.6   2006-2020 [employer].   Care instructions adapted under license by [facility]. If you have questions about a medical condition or this instruction, always ask your healthcare professional. [employer], Inc[employer] disclaims any warranty or liability  for your use of this information.       [employer]

## 2019-10-17 DIAGNOSIS — K529 Noninfective gastroenteritis and colitis, unspecified: Secondary | ICD-10-CM

## 2019-10-17 NOTE — Assessment & Plan Note (Addendum)
Start Questran at HS- may increase to BID  ? Bile salt induced diarrhea  If no improvement with Lanetta Inch consider trial of Creon  Colonoscopy

## 2019-10-18 NOTE — Telephone Encounter (Signed)
Barbarann Ehlers, APRN - CNP  [facility]       ??       Colonoscopy, orders in place, TY     Dx: Chronic Diarrhea    SS#[phone]  DOB: Jan 10, 1981  Ph# [phone]  Insurance: [employer]

## 2019-10-18 NOTE — Telephone Encounter (Signed)
 Attempted to reach pt to schedule colonoscopy, pt was unavailable, left message to call back.      Thank you

## 2019-12-11 ENCOUNTER — Encounter

## 2019-12-11 MED ORDER — CHOLESTYRAMINE 4 G PO PACK
4 g | PACK | ORAL | 1 refills | Status: DC
Start: 2019-12-11 — End: 2020-03-04

## 2020-01-02 ENCOUNTER — Encounter

## 2020-01-02 MED ORDER — CLOTRIMAZOLE-BETAMETHASONE 1-0.05 % EX CREA
CUTANEOUS | 0 refills | Status: DC
Start: 2020-01-02 — End: 2020-03-04

## 2020-01-02 NOTE — Telephone Encounter (Signed)
rx sent in notify patient

## 2020-01-02 NOTE — Telephone Encounter (Signed)
S: the patient is calling the [facility] about a rash    B: symptoms began yesterday     A: the patient is reporting a spot on each arm - the spot is round, red and scaly. She states she thinks she got it from her cat who was diagnosed with ringworm. She has been putting monostat on them - some relief noted. No fevers or streaking noted.      R: home care reviewed. She is asking if something can be called in - allergies any pharmacy confirmed. Please advise.     Reason for Disposition  ??? Ringworm    Protocols used: RINGWORM-ADULT-AH

## 2020-01-02 NOTE — Telephone Encounter (Signed)
Left detailed msg informing pt

## 2020-02-26 NOTE — Telephone Encounter (Signed)
S:38 y/o female concerned about STD exposure    B:Ex boyfriend was unfaithful    A:No symptoms, but found out her ex had multiple other partners.    R:Scheduled for appt. On 3/10 at the [facility] office, Neg. COVID screen. Offered sooner appointments, this was the first that worked for her schedule and she did not want to go to any other office locations.    Reason for Disposition  ??? Patient is worried about a sexually transmitted disease (STD)    Protocols used: STD EXPOSURE AND PREVENTION-ADULT-[place]

## 2020-03-04 ENCOUNTER — Ambulatory Visit
Admit: 2020-03-04 | Discharge: 2020-03-04 | Payer: PRIVATE HEALTH INSURANCE | Attending: Obstetrics & Gynecology | Primary: Internal Medicine

## 2020-03-04 DIAGNOSIS — N760 Acute vaginitis: Secondary | ICD-10-CM

## 2020-03-04 MED ORDER — METRONIDAZOLE 500 MG PO TABS
500 MG | ORAL_TABLET | Freq: Two times a day (BID) | ORAL | 0 refills | Status: AC
Start: 2020-03-04 — End: 2020-03-11

## 2020-03-04 NOTE — Progress Notes (Signed)
???  A chaperone was offered to be present during her exam.  The patient: declined

## 2020-03-04 NOTE — Addendum Note (Signed)
Addended byBetha Loa on: 03/04/2020 09:54 AM     Modules accepted: Orders

## 2020-03-04 NOTE — Progress Notes (Signed)
Chief Complaint   Patient presents with   ??? Other     STD testing    ??? Vaginal Discharge       Patient's last menstrual period was 02/09/2020.     History:    Past Medical History:   Diagnosis Date   ??? Abnormal EKG 01/29/2016   ??? Arthritis    ??? Atrial flutter (HCC)    ??? Depression    ??? Dermatitis 04/10/2018   ??? GERD (gastroesophageal reflux disease)    ??? Liver disease     HCV GT 1a/1b   ??? Rash    ??? Sleep disorder        Past Surgical History:   Procedure Laterality Date   ??? CHOLECYSTECTOMY     ??? FEMUR SURGERY Right     with hardware placement   ??? SALPINGECTOMY Bilateral 03/16/2017     Bilateral Salpingectomy       Family History   Problem Relation Age of Onset   ??? High Blood Pressure Mother    ??? Depression Mother    ??? High Blood Pressure Father    ??? No Known Problems Sister    ??? No Known Problems Brother        Social History     Socioeconomic History   ??? Marital status: Single     Spouse name: None   ??? Number of children: None   ??? Years of education: None   ??? Highest education level: None   Occupational History   ??? None   Social Needs   ??? Financial resource strain: None   ??? Food insecurity     Worry: None     Inability: None   ??? Transportation needs     Medical: None     Non-medical: None   Tobacco Use   ??? Smoking status: Current Every Day Smoker     Packs/day: 0.50     Years: 20.00     Pack years: 10.00     Types: Cigarettes   ??? Smokeless tobacco: Never Used   ??? Tobacco comment: Patient is a 1/2 pack a day smoker and has smoked since the age of 13 years   Substance and Sexual Activity   ??? Alcohol use: No     Alcohol/week: 0.0 standard drinks     Comment: 07/12/2014 sober date   ??? 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 Sexual activity: Yes     Partners: Male     Birth control/protection: OCP, Surgical     Comment: TL   Lifestyle   ??? Physical activity     Days per week: None     Minutes per session: None   ??? Stress: None   Relationships   ??? Social Wellsite geologist on  phone: None     Gets together: None     Attends religious service: None     Active member of club or organization: None     Attends meetings of clubs or organizations: None     Relationship status: None   ??? Intimate partner violence     Fear of current or ex partner: None     Emotionally abused: None     Physically abused: None     Forced sexual activity: None   Other Topics Concern   ??? None   Social History Narrative   ??? None  Allergies:    No Known Allergies    Medications:    Current Outpatient Medications on File Prior to Visit   Medication Sig Dispense Refill   ??? omeprazole (PRILOSEC) 40 MG delayed release capsule TAKE 1 CAPSULE BY MOUTH EVERY DAY 30 capsule 11   ??? traZODone (DESYREL) 100 MG tablet TAKE 1/2 TABLET BY MOUTH DAILY AT BEDTIME 30 tablet 11   ??? venlafaxine (EFFEXOR XR) 150 MG extended release capsule Take 1 capsule by mouth daily 30 capsule 3     No current facility-administered medications on file prior to visit.        HPI:39 yo female with c/o possible exposure to trich a few weeks ago and has noticed a vaginal discharge in the last week.  Has noticed greenish discharge , not sure about odor.  No itching or bleeding.      HPI    ROS:    Review of Systems   Constitutional: Negative.    Gastrointestinal: Negative.    Genitourinary: Positive for vaginal discharge. Negative for decreased urine volume, difficulty urinating, dyspareunia, dysuria, enuresis, flank pain, frequency, genital sores, hematuria, menstrual problem, pelvic pain, urgency, vaginal bleeding and vaginal pain.   Skin: Negative.    Allergic/Immunologic: Negative.        Physical exam:    BP 139/81    Pulse 106    Temp 98.2 ??F (36.8 ??C)    Wt 177 lb 3.2 oz (80.4 kg)    LMP 02/09/2020    BMI 32.15 kg/m??     Physical Exam  Constitutional:       General: She is not in acute distress.     Appearance: Normal appearance. She is well-developed. She is not ill-appearing, toxic-appearing or diaphoretic.   HENT:      Head: Normocephalic and  atraumatic.   Genitourinary:     General: Normal vulva.      Labia:         Right: No rash, tenderness, lesion or injury.         Left: No rash, tenderness, lesion or injury.       Vagina: No signs of injury and foreign body. Vaginal discharge and erythema present. No tenderness or bleeding.      Cervix: Normal.      Uterus: Normal. Not deviated, not enlarged, not fixed and not tender.       Adnexa: Right adnexa normal and left adnexa normal.        Right: No mass, tenderness or fullness.          Left: No mass, tenderness or fullness.           Skin:     General: Skin is warm and dry.   Neurological:      Mental Status: She is alert and oriented to person, place, and time.      Deep Tendon Reflexes: Reflexes are normal and symmetric.   Psychiatric:         Behavior: Behavior normal.         Thought Content: Thought content normal.         Judgment: Judgment normal.         Assessment and Plan:    Nhyira was seen today for other and vaginal discharge.    Diagnoses and all orders for this visit:    Acute vaginitis  -     metroNIDAZOLE (FLAGYL) 500 MG tablet; Take 1 tablet by mouth 2 times daily for 7 days  -  C. Trachomatis / N. Gonorrhoeae, DNA Probe; Future  -     Trichomonas Vaginali, Molecular; Future    wants call either way, if not trich is bv.     No follow-ups on file.

## 2020-03-05 LAB — C. TRACHOMATIS / N. GONORRHOEAE, DNA
C. trachomatis DNA: NOT DETECTED
NEISSERIA GONORRHOEAE, DNA: NOT DETECTED

## 2020-03-05 LAB — TRICHOMONAS VAGINALI, MOLECULAR: Trichomonas Vaginali, Molecular: DETECTED — AB

## 2020-03-13 ENCOUNTER — Telehealth
Admit: 2020-03-13 | Discharge: 2020-03-13 | Payer: PRIVATE HEALTH INSURANCE | Attending: Internal Medicine | Primary: Internal Medicine

## 2020-03-13 DIAGNOSIS — K219 Gastro-esophageal reflux disease without esophagitis: Secondary | ICD-10-CM

## 2020-03-13 MED ORDER — TRAZODONE HCL 100 MG PO TABS
100 | ORAL_TABLET | Freq: Every evening | ORAL | 11 refills | Status: DC
Start: 2020-03-13 — End: 2021-04-09

## 2020-03-13 NOTE — Assessment & Plan Note (Signed)
M: patient had episode of depression about 2 weeks ago she went back to drinking ETOh and she just stopped again   E:  She is back to AA and she is now dry 5 days    A: etoh use   T: continue AA

## 2020-03-13 NOTE — Assessment & Plan Note (Signed)
M: patient states she had episode of depression about 3 weeks ago she states she went back to etoh but is better   E:  effexor and trazodone is working better she stopped etoh use again   A: stable  , CMP, TSH  T: continue effexor and trazodone at 100 mg qhs

## 2020-03-13 NOTE — Progress Notes (Signed)
Visit type: Follow up    Reason for Visit: Gastroesophageal Reflux (6 Months follow up. )    1. Gastroesophageal reflux disease without esophagitis  Assessment & Plan:  M: no heartburn less stress in her life   E:  She states prilosec is working well   A: stable  ,  T: continue prilosec     2. Dysthymia  Assessment & Plan:  M: patient states she had episode of depression about 3 weeks ago she states she went back to etoh but is better   E:  effexor and trazodone is working better she stopped etoh use again   A: stable  , CMP, TSH  T: continue effexor and trazodone at 100 mg qhs     Orders:  -     traZODone (DESYREL) 100 MG tablet; Take 1 tablet by mouth nightly, Disp-30 tablet, R-11Normal  -     Comprehensive Metabolic Panel; Future  -     Lipid Panel; Future  -     TSH without Reflex; Future  3. Alcohol use disorder, moderate, dependence (HCC)  Assessment & Plan:  M: patient had episode of depression about 2 weeks ago she went back to drinking ETOh and she just stopped again   E:  She is back to AA and she is now dry 5 days    A: etoh use   T: continue AA     Orders:  -     CBC; Future     Return in about 6 months (around 09/13/2020).    Subjective   Patient:   Alyssa Freeman is a 39 y.o. female who presents with chief complaint of Gastroesophageal Reflux (6 Months follow up. )    Patient has history of Depression, GERD, Dermatitis, + hx of heroin use and etoh use quit 2015     Patient and I discussed her chronic stable problems of depression, gerd and etoh use       Patient was seen today via Telehealth by agreement and consent in light of the current COVID-19 pandemic. I used the following Telehealth technology: Audio capability only. Total Length of call 15 minutes. The patient was offered and advised video for a more comprehensive evaluation, but the patient declined or was unable to use video. This patient encounter is appropriate and reasonable under the circumstances given the patient's particular  presentation at this time. The patient has been advised of the potential risks and limitations of this mode of treatment (including but not limited to the absence of in-person examination) and has agreed to be treated in a remote fashion in spite of them. Any and all of the patient's/patient's family's questions on this issue have been answered and I have made no promises or guarantees to the patient. The patient has also been advised to contact this office for worsening conditions or problems, and seek emergency medical treatment and/or call 911 if the patient deems either necessary. The patient stated that they are currently in the state of [place]. If the patient is a minor, permission has been obtained by the parent or guardian for the patient to receive medical care at this visit.        Review of Systems   Constitutional: Negative for chills and fever.   Respiratory: Negative for cough and shortness of breath.    Cardiovascular: Negative for chest pain and palpitations.   Gastrointestinal: Negative for diarrhea, nausea and vomiting.          No Known Allergies  Outpatient Medications Prior to Visit   Medication Sig Dispense Refill   . omeprazole (PRILOSEC) 40 MG delayed release capsule TAKE 1 CAPSULE BY MOUTH EVERY DAY 30 capsule 11   . venlafaxine (EFFEXOR XR) 150 MG extended release capsule Take 1 capsule by mouth daily 30 capsule 3   . traZODone (DESYREL) 100 MG tablet TAKE 1/2 TABLET BY MOUTH DAILY AT BEDTIME 30 tablet 11     No facility-administered medications prior to visit.       Past Medical History:   Diagnosis Date   . Abnormal EKG 01/29/2016   . Arthritis    . Atrial flutter ([place])    . Depression    . Dermatitis 04/10/2018   . GERD (gastroesophageal reflux disease)    . Liver disease     HCV GT 1a/1b   . Rash    . Sleep disorder       Social History     Tobacco Use   . Smoking status: Current Every Day Smoker     Packs/day: 0.50     Years: 20.00     Pack years: 10.00     Types: Cigarettes   . Smokeless  tobacco: Never Used   . Tobacco comment: Patient is a 1/2 pack a day smoker and has smoked since the age of 59 years   Substance Use Topics   . Alcohol use: No     Alcohol/week: 0.0 standard drinks     Comment: 07/12/2014 sober date      Past Surgical History:   Procedure Laterality Date   . CHOLECYSTECTOMY     . FEMUR SURGERY Right     with hardware placement   . SALPINGECTOMY Bilateral 03/16/2017     Bilateral Salpingectomy     Family History   Problem Relation Age of Onset   . High Blood Pressure Mother    . Depression Mother    . High Blood Pressure Father    . No Known Problems Sister    . No Known Problems Brother      Objective   There were no vitals taken for this visit.  Wt Readings from Last 3 Encounters:   03/04/20 177 lb 3.2 oz (80.4 kg)   10/16/19 170 lb (77.1 kg)   06/01/18 183 lb 6.4 oz (83.2 kg)     Physical Exam  Constitutional:       General: She is not in acute distress.     Appearance: She is well-developed.   Pulmonary:      Comments: No audible wheezing   Neurological:      Mental Status: She is alert and oriented to person, place, and time. Mental status is at baseline.   Psychiatric:         Mood and Affect: Mood normal.         Behavior: Behavior normal.       Data Reviewed and Summarized:     Lab Results   Component Value Date    WBC 9.6 09/16/2019    HGB 14.1 09/16/2019    HCT 43.0 09/16/2019    MCV 88.4 09/16/2019    PLT 364 09/16/2019     Lab Results   Component Value Date    NA 134 (L) 04/10/2018    K 4.3 04/10/2018    CL 104 04/10/2018    CO2 22 04/10/2018    BUN 14 04/10/2018    CREATININE 0.75 04/10/2018    GLUCOSE 91 04/10/2018    CALCIUM  9.4 04/10/2018    PROT 7.1 04/10/2018    LABALBU 4.1 04/10/2018    BILITOT 0.5 04/10/2018    ALKPHOS 69 04/10/2018    AST 19 04/10/2018    ALT 22 04/10/2018         Lab Results   Component Value Date    TSH 1.980 01/29/2016     No results found for: LABA1C  Lab Results   Component Value Date    CHOL 226 (A) 04/10/2018    CHOL 250 (A) 07/02/2015      Lab Results   Component Value Date    TRIG 223 (A) 04/10/2018    TRIG 88 07/02/2015     Lab Results   Component Value Date    HDL 38 (L) 04/10/2018    HDL 51 07/02/2015     Lab Results   Component Value Date    LDLCHOLESTEROL 143 (A) 04/10/2018         Goals    None         Medications Discontinued During This Encounter   Medication Reason   . traZODone (DESYREL) 100 MG tablet REORDER         FOLLOWUP      Return in about 6 months (around 09/13/2020).    Electronically signed by Tommy Medal, MD on 03/13/20 at 9:48 AM EDT

## 2020-03-13 NOTE — Assessment & Plan Note (Signed)
M: no heartburn less stress in her life   E:  She states prilosec is working well   A: stable  ,  T: continue prilosec

## 2020-05-19 MED ORDER — NICOTINE 14 MG/24HR TD PT24
14 MG/24HR | MEDICATED_PATCH | Freq: Every day | TRANSDERMAL | 0 refills | Status: DC
Start: 2020-05-19 — End: 2020-12-07

## 2020-05-19 NOTE — Telephone Encounter (Signed)
Medication Name: nicotine (NICODERM CQ) 21 MG/24HR     Medication Dosage: 21 mg (Milligrams)    Monthly Quantity Needed: 30    How many day supply requesting: 30 days    Medication Route: Transdermal patch     Medication Administration Time: Daily    Date of Last Refill: (See Medication Tab): 4.2.2020    If taking medication PRN - Reason for taking medication: n/a    If this is a controlled substance do you receive this or any other controlled medication from any other doctor or facility? N/A    Ordering Physician: Dr Dory Peru    Date of Last Office Visit: 3.19.21    Date of Next Office Visit: 9.20.21    Updated/Validated Preferred Pharmacy: Yes    Patient instructed to contact the pharmacy prior to picking up the medication: Yes

## 2020-06-17 ENCOUNTER — Encounter: Attending: Obstetrics & Gynecology | Primary: Internal Medicine

## 2020-07-28 ENCOUNTER — Encounter

## 2020-07-28 MED ORDER — VENLAFAXINE HCL ER 150 MG PO CP24
150 MG | ORAL_CAPSULE | ORAL | 11 refills | Status: DC
Start: 2020-07-28 — End: 2021-01-08

## 2020-07-28 MED ORDER — OMEPRAZOLE 40 MG PO CPDR
40 | ORAL_CAPSULE | ORAL | 11 refills | Status: DC
Start: 2020-07-28 — End: 2021-07-26

## 2020-09-14 ENCOUNTER — Encounter: Attending: Internal Medicine | Primary: Internal Medicine

## 2020-10-28 NOTE — Telephone Encounter (Signed)
Received a surgical clearance form and I called pt and lmom for c/b. If she calls back then please Schedule for a pre op. He surgery is for 12/08/2020.

## 2020-12-03 NOTE — Telephone Encounter (Signed)
Name of Caller: Alyssa Freeman Phone Number: [phone]    Reason for Appointment: patient needs a pre op appt for knee surgery she is having on 12.14.21 schedule is full    Office Name: [facility]    Medication Refills need, if any: unknwn    Medication Name: Gaspar Cola

## 2020-12-03 NOTE — Telephone Encounter (Signed)
Ok to work in Friday afternoon

## 2020-12-04 NOTE — Telephone Encounter (Signed)
Spoke to pt and dr.Chung yesterday. Ok for Monday done

## 2020-12-07 ENCOUNTER — Ambulatory Visit
Admit: 2020-12-07 | Discharge: 2020-12-07 | Payer: PRIVATE HEALTH INSURANCE | Attending: Internal Medicine | Primary: Internal Medicine

## 2020-12-07 DIAGNOSIS — Z01818 Encounter for other preprocedural examination: Secondary | ICD-10-CM

## 2020-12-07 NOTE — Assessment & Plan Note (Signed)
M: no heartburn   E:  She is compliant with ppi   A: stable  T: continue ppi

## 2020-12-07 NOTE — Progress Notes (Signed)
Visit type: Follow up    Reason for Visit: Pre-op Exam (Patient is here for her surgical clearance for her knee. )    1. Pre-op evaluation  Pre-op clearance: patient has no history of sob or chest pain  She is able to do 4 mets without sob  She has no evidence of Aortic stenosis, or chf      Plan: patient is medically stable for surgery         2. Tear of medial meniscus of left knee, current, unspecified tear type, subsequent encounter  Assessment & Plan:  M: Dr Armanda Magic recommends repair   E:  Tylenol for pain   A: worsen   T: surgery per dr Armanda Magic     3. Smoking  Assessment & Plan:  M: still smoking   E:  1 ppd   A: unchanged  T: I have encouraged her to quit     4. Gastroesophageal reflux disease without esophagitis  Assessment & Plan:  M: no heartburn   E:  She is compliant with ppi   A: stable  T: continue ppi      Return in about 6 months (around 06/07/2021).    Subjective   Patient:   Alyssa Freeman is a 39 y.o. female who presents with chief complaint of Pre-op Exam (Patient is here for her surgical clearance for her knee. )    Patient has history of GERD, stopped etoh use , smoking , no IVDU x 6 years       Pre-op clearance: patient has no history of sob or chest pain  She is able to do 4 mets without sob  She has no evidence of Aortic stenosis, or chf      Plan: patient is medically stable for surgery             Review of Systems   Constitutional: Negative for chills and fever.   Respiratory: Negative for cough and shortness of breath.    Cardiovascular: Negative for chest pain and palpitations.   Gastrointestinal: Negative for diarrhea, nausea and vomiting.          No Known Allergies  Outpatient Medications Prior to Visit   Medication Sig Dispense Refill   . venlafaxine (EFFEXOR XR) 150 MG extended release capsule TAKE 1 CAPSULE BY MOUTH EVERY DAY 30 capsule 11   . omeprazole (PRILOSEC) 40 MG delayed release capsule TAKE 1 CAPSULE BY MOUTH EVERY DAY 30 capsule 11   . traZODone (DESYREL) 100 MG tablet Take 1  tablet by mouth nightly 30 tablet 11   . nicotine (NICODERM CQ) 14 MG/24HR Place 1 patch onto the skin daily 42 patch 0     No facility-administered medications prior to visit.      Past Medical History:   Diagnosis Date   . Abnormal EKG 01/29/2016   . Arthritis    . Atrial flutter (HCC)    . Depression    . Dermatitis 04/10/2018   . GERD (gastroesophageal reflux disease)    . Liver disease     HCV GT 1a/1b   . Rash    . Sleep disorder       Social History     Tobacco Use   . Smoking status: Current Every Day Smoker     Packs/day: 0.50     Years: 20.00     Pack years: 10.00     Types: Cigarettes   . Smokeless tobacco: Never Used   . Tobacco comment: Patient is  a 1/2 pack a day smoker and has smoked since the age of 13 years   Substance Use Topics   . Alcohol use: No     Alcohol/week: 0.0 standard drinks     Comment: 07/12/2014 sober date      Past Surgical History:   Procedure Laterality Date   . CHOLECYSTECTOMY     . FEMUR SURGERY Right     with hardware placement   . SALPINGECTOMY Bilateral 03/16/2017     Bilateral Salpingectomy     Family History   Problem Relation Age of Onset   . High Blood Pressure Mother    . Depression Mother    . High Blood Pressure Father    . No Known Problems Sister    . No Known Problems Brother      Objective   BP 108/68   Pulse 73   Ht 5' 2.25" (1.581 m)   Wt 173 lb 9.6 oz (78.7 kg)   SpO2 98%   BMI 31.50 kg/m   Wt Readings from Last 3 Encounters:   12/07/20 173 lb 9.6 oz (78.7 kg)   03/04/20 177 lb 3.2 oz (80.4 kg)   10/16/19 170 lb (77.1 kg)     Physical Exam  Constitutional:       Appearance: She is well-developed.   HENT:      Head: Normocephalic and atraumatic.      Mouth/Throat:      Pharynx: No oropharyngeal exudate.   Eyes:      Conjunctiva/sclera: Conjunctivae normal.      Pupils: Pupils are equal, round, and reactive to light.   Cardiovascular:      Rate and Rhythm: Normal rate and regular rhythm.      Heart sounds: Normal heart sounds. No murmur heard.  No friction rub.  No gallop.    Pulmonary:      Effort: Pulmonary effort is normal. No respiratory distress.      Breath sounds: No wheezing or rales.   Abdominal:      General: Bowel sounds are normal.      Palpations: Abdomen is soft.   Musculoskeletal:      Cervical back: Normal range of motion and neck supple.   Skin:     General: Skin is warm and dry.   Neurological:      Mental Status: She is alert and oriented to person, place, and time.       Data Reviewed and Summarized:     Lab Results   Component Value Date    WBC 9.6 09/16/2019    HGB 14.1 09/16/2019    HCT 43.0 09/16/2019    MCV 88.4 09/16/2019    PLT 364 09/16/2019     Lab Results   Component Value Date    NA 134 (L) 04/10/2018    K 4.3 04/10/2018    CL 104 04/10/2018    CO2 22 04/10/2018    BUN 14 04/10/2018    CREATININE 0.75 04/10/2018    GLUCOSE 91 04/10/2018    CALCIUM 9.4 04/10/2018    PROT 7.1 04/10/2018    LABALBU 4.1 04/10/2018    BILITOT 0.5 04/10/2018    ALKPHOS 69 04/10/2018    AST 19 04/10/2018    ALT 22 04/10/2018         Lab Results   Component Value Date    TSH 1.980 01/29/2016     No results found for: LABA1C  Lab Results   Component Value Date  CHOL 226 (A) 04/10/2018    CHOL 250 (A) 07/02/2015     Lab Results   Component Value Date    TRIG 223 (A) 04/10/2018    TRIG 88 07/02/2015     Lab Results   Component Value Date    HDL 38 (L) 04/10/2018    HDL 51 07/02/2015     Lab Results   Component Value Date    LDLCHOLESTEROL 143 (A) 04/10/2018         Goals    None         There are no discontinued medications.      FOLLOWUP      Return in about 6 months (around 06/07/2021).    Electronically signed by Tommy Medal, MD on 12/07/20 at 11:54 AM EST

## 2020-12-07 NOTE — Assessment & Plan Note (Signed)
M: Dr Armanda Magic recommends repair   E:  Tylenol for pain   A: worsen   T: surgery per dr Armanda Magic

## 2020-12-07 NOTE — Assessment & Plan Note (Signed)
M: still smoking   E:  1 ppd   A: unchanged  T: I have encouraged her to quit

## 2021-01-07 ENCOUNTER — Telehealth

## 2021-01-07 NOTE — Telephone Encounter (Signed)
Name of Caller: Alyssa Freeman phone number: [phone]    Relationship to Patient: Patient    Provider: Dory Peru    Practice:  [facility]    Chief Complaint/Reason for Call: Pt wants to know if she can get her venlafaxine (EFFEXOR XR) 150 MG extended release capsule    uppded due to not doing well with 150 MG.     Best time of day caller can be reached: Any       Patient advised that office/PCP has 24-48 business hours to return their call: No

## 2021-01-08 MED ORDER — VENLAFAXINE HCL ER 150 MG PO CP24
150 | ORAL_CAPSULE | Freq: Every day | ORAL | 11 refills | Status: DC
Start: 2021-01-08 — End: 2021-01-19

## 2021-01-15 NOTE — Telephone Encounter (Signed)
Spoke with patient and informed her. She would like you to send new prescription of 300 mg to her pharmacy.
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
## 2021-01-15 NOTE — Telephone Encounter (Signed)
Ok to go to effexor 300 mg daily

## 2021-01-19 ENCOUNTER — Encounter

## 2021-01-19 MED ORDER — VENLAFAXINE HCL ER 150 MG PO CP24
150 | ORAL_CAPSULE | Freq: Every day | ORAL | 11 refills | Status: DC
Start: 2021-01-19 — End: 2021-02-01

## 2021-01-19 NOTE — Telephone Encounter (Signed)
She would like you to send new prescription of 300 mg to her pharmacy.

## 2021-02-01 ENCOUNTER — Encounter

## 2021-02-01 MED ORDER — VENLAFAXINE HCL ER 150 MG PO CP24
150 MG | ORAL_CAPSULE | Freq: Every day | ORAL | 11 refills | Status: DC
Start: 2021-02-01 — End: 2021-04-09

## 2021-02-01 NOTE — Progress Notes (Signed)
Pt presented to front desk stating the the effexor dr Dory Peru order on 01/19/21 was not at her pharmacy. resent order

## 2021-04-09 ENCOUNTER — Encounter

## 2021-04-09 MED ORDER — VENLAFAXINE HCL ER 150 MG PO CP24
150 MG | ORAL_CAPSULE | Freq: Every day | ORAL | 11 refills | Status: DC
Start: 2021-04-09 — End: 2021-07-26

## 2021-04-09 MED ORDER — TRAZODONE HCL 100 MG PO TABS
100 MG | ORAL_TABLET | Freq: Every evening | ORAL | 11 refills | Status: DC
Start: 2021-04-09 — End: 2021-07-26

## 2021-04-09 NOTE — Telephone Encounter (Signed)
(  1) Medication Name: venlafaxine (EFFEXOR XR) 150 MG extended release capsule [[id number]]   ?? Order Details  Dose: 300 mg Route: Oral Frequency: DAILY   Dispense Quantity: 60 capsule Refills: 11    ??     Sig: Take 2 capsules by mouth daily   ??     Start Date: 02/01/21 End Date: --   Written Date: 02/01/21 Expiration Date: 02/01/22   ??   Diagnosis Association: Dysthymia (F34.1)   Original Order:  venlafaxine (EFFEXOR XR) 150 MG extended release capsule [[id number]]     Providers    Authorizing Provider: Thurman Coyer, APRN - CNP NPI: [id number]   Ordering User:  Thurman Coyer, APRN - CNP        Date of Last Office Visit: 12/07/2020    Date of Next Office Visit: 06/10/2021    Updated/Validated Preferred Pharmacy: Yes Pt uses [id number] - [place], [place] - [address] - P [phone] - [phone]    Patient instructed to contact the pharmacy prior to picking up the medication: Yes        (2) Medication Name:traZODone (DESYREL) 100 MG tablet [[id number]]   ?? Order Details  Dose: 100 mg Route: Oral Frequency: NIGHTLY   Dispense Quantity: 30 tablet Refills: 11    ??     Sig: Take 1 tablet by mouth nightly   ??     Start Date: 03/13/20 End Date: --   Written Date: 03/13/20 Expiration Date: 03/13/21   ??   Diagnosis Association: Dysthymia (F34.1)   Original Order:  traZODone (DESYREL) 100 MG tablet [[id number]]     Providers    Authorizing Provider: Tommy Medal, MD NPI: [id number]   Ordering User:  Tommy Medal, MD

## 2021-06-10 ENCOUNTER — Encounter: Attending: Internal Medicine | Primary: Internal Medicine

## 2021-07-19 ENCOUNTER — Ambulatory Visit
Admit: 2021-07-19 | Discharge: 2021-07-26 | Payer: PRIVATE HEALTH INSURANCE | Attending: Nurse Practitioner | Primary: Internal Medicine

## 2021-07-19 DIAGNOSIS — N3001 Acute cystitis with hematuria: Secondary | ICD-10-CM

## 2021-07-19 LAB — POCT URINALYSIS DIPSTICK W/O MICROSCOPE (AUTO)
Bilirubin, UA: NEGATIVE
Nitrite, UA: POSITIVE
Spec Grav, UA: 1.025
Urobilinogen, UA: 2
pH, UA: 5

## 2021-07-19 LAB — POC PREGNANCY UR-QUAL: HCG, Urine, POC: NEGATIVE

## 2021-07-19 MED ORDER — FLUCONAZOLE 150 MG PO TABS
150 MG | ORAL_TABLET | Freq: Once | ORAL | 0 refills | Status: AC
Start: 2021-07-19 — End: 2021-07-19

## 2021-07-19 MED ORDER — CEPHALEXIN 500 MG PO CAPS
500 MG | ORAL_CAPSULE | Freq: Two times a day (BID) | ORAL | 0 refills | Status: AC
Start: 2021-07-19 — End: 2021-07-26

## 2021-07-19 MED ORDER — PHENAZOPYRIDINE HCL 200 MG PO TABS
200 MG | ORAL_TABLET | Freq: Three times a day (TID) | ORAL | 0 refills | Status: AC | PRN
Start: 2021-07-19 — End: 2021-07-21

## 2021-07-19 NOTE — Progress Notes (Signed)
Subjective:     Patient: Alyssa Freeman is a 40 y.o. female     Patient presents to [facility] urgent care for complaints of urinary urgency, lower abdominal cramping onset was this past Thursday.  Patient is afebrile in clinic today. Pt has not had UTI in the past. Pt denies fever, chills, flank pain.     HPI     Review of Systems   Constitutional:  Positive for activity change. Negative for appetite change, chills, fatigue and fever.   Respiratory:  Negative for cough and shortness of breath.    Cardiovascular:  Negative for chest pain.   Gastrointestinal:  Positive for abdominal pain (suprapubic pressure). Negative for nausea and vomiting.   Genitourinary:  Positive for dysuria and urgency. Negative for difficulty urinating, flank pain, frequency and hematuria.   Musculoskeletal:  Negative for back pain and myalgias.   Allergic/Immunologic: Negative for environmental allergies, food allergies and immunocompromised state.   Neurological:  Negative for weakness.   Psychiatric/Behavioral:  Negative for agitation and behavioral problems.       No Known Allergies  Current Outpatient Medications on File Prior to Visit   Medication Sig Dispense Refill    esomeprazole Magnesium (NEXIUM) 20 MG PACK Take 20 mg by mouth in the morning.      venlafaxine (EFFEXOR XR) 150 MG extended release capsule Take 2 capsules by mouth daily 60 capsule 11    traZODone (DESYREL) 100 MG tablet Take 1 tablet by mouth nightly 30 tablet 11    omeprazole (PRILOSEC) 40 MG delayed release capsule TAKE 1 CAPSULE BY MOUTH EVERY DAY 30 capsule 11     No current facility-administered medications on file prior to visit.      Past Medical History:   Diagnosis Date    Abnormal EKG 01/29/2016    Arthritis     Atrial flutter (HCC)     Depression     Dermatitis 04/10/2018    GERD (gastroesophageal reflux disease)     Liver disease     HCV GT 1a/1b    Rash     Sleep disorder       Social History     Tobacco Use    Smoking status: Every Day     Packs/day: 0.50      Years: 20.00     Pack years: 10.00     Types: Cigarettes    Smokeless tobacco: Never    Tobacco comments:     Patient is a 1/2 pack a day smoker and has smoked since the age of 13 years   Substance Use Topics    Alcohol use: No     Alcohol/week: 0.0 standard drinks     Comment: 07/12/2014 sober date          Objective:     BP 129/87 (Site: Left Upper Arm, Position: Sitting, Cuff Size: Medium Adult)    Pulse 86    Temp 98.4 ??F (36.9 ??C) (Infrared)    Resp 18    Wt 180 lb 3.2 oz (81.7 kg)    LMP 07/12/2021 (Approximate)    SpO2 99%    BMI 32.69 kg/m??     Physical Exam  Vitals and nursing note reviewed.   Constitutional:       General: She is not in acute distress.     Appearance: Normal appearance. She is not ill-appearing, toxic-appearing or diaphoretic.   HENT:      Head: Normocephalic.   Pulmonary:      Effort: Pulmonary  effort is normal.   Abdominal:      General: Bowel sounds are normal.      Tenderness: There is no abdominal tenderness. There is no right CVA tenderness, left CVA tenderness or guarding.   Skin:     General: Skin is warm and dry.   Neurological:      General: No focal deficit present.      Mental Status: She is alert and oriented to person, place, and time.   Psychiatric:         Mood and Affect: Mood normal.         Behavior: Behavior normal.         Thought Content: Thought content normal.         Judgment: Judgment normal.       Assessment      1. Acute cystitis with hematuria    2. Urinary urgency         Plan      1. Urinary urgency  - POC Pregnancy Urine Qual  - POCT Urinalysis No Micro (Auto)    2. Acute cystitis with hematuria  - Culture, Urine; Future  - cephALEXin (KEFLEX) 500 MG capsule; Take 1 capsule by mouth in the morning and 1 capsule before bedtime. Do all this for 7 days.  Dispense: 14 capsule; Refill: 0  - phenazopyridine (PYRIDIUM) 200 MG tablet; Take 1 tablet by mouth 3 times daily as needed for Pain  Dispense: 6 tablet; Refill: 0  - fluconazole (DIFLUCAN) 150 MG tablet; Take 1  tablet by mouth once for 1 dose  Dispense: 1 tablet; Refill: 0  - Culture, Urine  Results for orders placed or performed in visit on 07/19/21   POC Pregnancy Urine Qual   Result Value Ref Range    HCG, Urine, POC Negative Negative    Lot Number [id number]     Positive QC Pass/Fail Pass     Negative QC Pass/Fail Pass    POCT Urinalysis No Micro (Auto)   Result Value Ref Range    Color, UA orange     Clarity, UA cloudy     Glucose, UA POC 100mg      Bilirubin, UA neg     Ketones, UA trace     Spec Grav, UA 1.025     Blood, UA POC trace-intact     pH, UA 5.0     Protein, UA POC 30mg      Urobilinogen, UA 2.0     Leukocytes, UA trace     Nitrite, UA positive      Urine culture collected and sent to lab, will call patient with results.  Patient presentation and clinical exam are consistent with acute cystitis with hematuria.  Prescription for Keflex, Pyridium, diflucan sent to pharmacy.  Patient is to use medication as directed, encouraged to complete entire course of antibiotics.  Patient is to rest and increase her fluids as directed.  Patient may utilize Tylenol and/or ibuprofen for comfort.  Should patient develop fever, chills, one-sided flank pain despite use of medication, patient is to be evaluated in the emergency department.  Patient may otherwise follow-up with PCP for new or worsening symptoms.  Patient verbalizes agreement and understanding with plan of care.    , APRN - NP  07/19/21  3:31 PM        If symptoms do not improve, worsen, or new symptoms develop, see PCP for further evaluation.

## 2021-07-22 LAB — CULTURE, URINE
Urine Culture, Routine: >100000
Urine Culture, Routine: NORMAL — AB

## 2021-07-25 ENCOUNTER — Encounter

## 2021-07-26 ENCOUNTER — Encounter

## 2021-07-26 MED ORDER — TRAZODONE HCL 100 MG PO TABS
100 | ORAL_TABLET | Freq: Every evening | ORAL | 3 refills | Status: DC
Start: 2021-07-26 — End: 2021-07-26

## 2021-07-26 MED ORDER — VENLAFAXINE HCL ER 150 MG PO CP24
150 MG | ORAL_CAPSULE | ORAL | 11 refills | Status: AC
Start: 2021-07-26 — End: ?

## 2021-07-26 MED ORDER — OMEPRAZOLE 40 MG PO CPDR
40 MG | ORAL_CAPSULE | ORAL | 11 refills | Status: AC
Start: 2021-07-26 — End: ?

## 2021-07-26 MED ORDER — TRAZODONE HCL 100 MG PO TABS
100 MG | ORAL_TABLET | Freq: Every evening | ORAL | 11 refills | Status: AC
Start: 2021-07-26 — End: ?

## 2021-07-26 MED ORDER — VENLAFAXINE HCL ER 150 MG PO CP24
150 MG | ORAL_CAPSULE | ORAL | 3 refills | Status: DC
Start: 2021-07-26 — End: 2021-07-26

## 2023-12-25 ENCOUNTER — Encounter: Payer: Self-pay | Admitting: Hematology and Oncology
